# Patient Record
Sex: Female | Born: 1990 | Hispanic: Yes | Smoking: Never smoker
Health system: Southern US, Community
[De-identification: ages and names within clinical notes are randomized; demographics above are authoritative.]

## PROBLEM LIST (undated history)

## (undated) ENCOUNTER — Inpatient Hospital Stay (HOSPITAL_COMMUNITY): Payer: Self-pay

## (undated) DIAGNOSIS — G709 Myoneural disorder, unspecified: Secondary | ICD-10-CM

## (undated) DIAGNOSIS — Q899 Congenital malformation, unspecified: Secondary | ICD-10-CM

## (undated) DIAGNOSIS — G8929 Other chronic pain: Secondary | ICD-10-CM

## (undated) DIAGNOSIS — Z789 Other specified health status: Secondary | ICD-10-CM

## (undated) DIAGNOSIS — K219 Gastro-esophageal reflux disease without esophagitis: Secondary | ICD-10-CM

---

## 2011-06-24 LAB — PULMONARY FUNCTION TEST

## 2015-10-16 ENCOUNTER — Encounter: Payer: Self-pay | Admitting: Family Medicine

## 2016-03-11 ENCOUNTER — Encounter: Payer: Self-pay | Admitting: Family Medicine

## 2016-10-07 ENCOUNTER — Inpatient Hospital Stay (HOSPITAL_COMMUNITY): Admission: AD | Admit: 2016-10-07 | Payer: Self-pay | Source: Ambulatory Visit | Admitting: Obstetrics and Gynecology

## 2017-01-19 ENCOUNTER — Encounter (HOSPITAL_COMMUNITY): Payer: Self-pay

## 2017-01-19 ENCOUNTER — Inpatient Hospital Stay (HOSPITAL_COMMUNITY)
Admission: AD | Admit: 2017-01-19 | Discharge: 2017-01-20 | Disposition: A | Discharge status: Home / Self Care | Payer: Managed Care, Other (non HMO) | Source: Ambulatory Visit | Attending: Obstetrics & Gynecology | Admitting: Obstetrics & Gynecology

## 2017-01-19 DIAGNOSIS — O479 False labor, unspecified: Secondary | ICD-10-CM

## 2017-01-19 DIAGNOSIS — Z3A36 36 weeks gestation of pregnancy: Secondary | ICD-10-CM | POA: Diagnosis not present

## 2017-01-19 DIAGNOSIS — Z3483 Encounter for supervision of other normal pregnancy, third trimester: Secondary | ICD-10-CM | POA: Diagnosis present

## 2017-01-19 HISTORY — DX: Other specified health status: Z78.9

## 2017-01-19 NOTE — MAU Note (Signed)
Pt reports contractions that started this morning, but have gotten more intense tonight. Pt has not timed the contractions. Pt denies vaginal bleeding or LOF. Reports good fetal movement. Pt is scheduled for a repeat c/s on 02/10/2017.

## 2017-01-20 ENCOUNTER — Encounter (HOSPITAL_COMMUNITY): Payer: Self-pay | Admitting: *Deleted

## 2017-01-20 DIAGNOSIS — Z3483 Encounter for supervision of other normal pregnancy, third trimester: Secondary | ICD-10-CM | POA: Diagnosis not present

## 2017-01-20 LAB — URINALYSIS, ROUTINE W REFLEX MICROSCOPIC
BILIRUBIN URINE: NEGATIVE
Glucose, UA: NEGATIVE mg/dL
Hgb urine dipstick: NEGATIVE
Ketones, ur: NEGATIVE mg/dL
Nitrite: NEGATIVE
PROTEIN: 30 mg/dL — AB
Specific Gravity, Urine: 1.023 (ref 1.005–1.030)
pH: 6 (ref 5.0–8.0)

## 2017-01-20 MED ORDER — TERBUTALINE SULFATE 1 MG/ML IJ SOLN
0.2500 mg | Freq: Once | INTRAMUSCULAR | Status: AC
  Administered 2017-01-20: 0.25 mg via SUBCUTANEOUS
  Filled 2017-01-20: qty 1

## 2017-01-20 MED ORDER — CALCIUM CARBONATE ANTACID 500 MG PO CHEW
2.0000 | CHEWABLE_TABLET | Freq: Once | ORAL | Status: AC
  Administered 2017-01-20: 400 mg via ORAL
  Filled 2017-01-20: qty 2

## 2017-01-20 NOTE — MAU Note (Signed)
I have communicated with Dr. Juliene Pina and reviewed vital signs:  Vitals:   01/20/17 0000  BP: 100/62  Pulse: 87  Resp: 16  Temp: 98 F (36.7 C)    Vaginal exam:  Dilation: Closed Effacement (%): Thick Cervical Position: Middle Exam by:: B Mikal Wisman,   Also reviewed contraction pattern and that non-stress test is reactive.  It has been documented that patient is contracting rarely with.  Patient denies any other complaints.  Based on this report provider has given order for discharge.  A discharge order and diagnosis entered by a provider.   Labor discharge instructions reviewed with patient.

## 2017-01-20 NOTE — MAU Note (Signed)
Pt reports contractions and cramping that has intensified throughout the day.

## 2017-01-20 NOTE — MAU Provider Note (Signed)
Pt d/w RN. NST reactive, normal baseline 140s/ + accels/ no decels/ mod variability Toco rare after terbutaline injection.  Fup w me in office this wk  V.Juliene Pina, MD

## 2017-01-20 NOTE — Discharge Instructions (Signed)

## 2017-07-30 ENCOUNTER — Encounter (HOSPITAL_COMMUNITY): Payer: Self-pay | Admitting: *Deleted

## 2017-07-31 ENCOUNTER — Other Ambulatory Visit: Payer: Self-pay | Admitting: Obstetrics & Gynecology

## 2017-08-06 ENCOUNTER — Encounter (HOSPITAL_COMMUNITY): Payer: Self-pay | Admitting: Obstetrics & Gynecology

## 2017-08-06 NOTE — H&P (Signed)
Bethany SealsJohana Hall is an 26 y.o. female [phone]. Unplanned pregnancy. Not using OCs. Husband planning vasectomy. Last baby C/s (4th) in [place] in May'18. Pt is not breast feeding.  Married. Monogamous. Wanted sterilization but attempt failed at 3rd C/section due to adhesions and again at 4th C/section this yr. At postpartum visit she was given OCs and all options, she stopped taking OCs.  Nl Pap. No STIs.   No LMP recorded. Patient is pregnant.    Past Medical History:  Diagnosis Date  . GERD (gastroesophageal reflux disease)   . Medical history non-contributory     Past Surgical History:  Procedure Laterality Date  . CESAREAN SECTION    x4  History reviewed. No pertinent family history.  Social History:  reports that she has never smoked. She has never used smokeless tobacco. She reports that she does not drink alcohol or use drugs.  Allergies:  Allergies  Allergen Reactions  . Penicillins Rash    Has patient had a PCN reaction causing immediate rash, facial/tongue/throat swelling, SOB or lightheadedness with hypotension: Unknown Has patient had a PCN reaction causing severe rash involving mucus membranes or skin necrosis: Unknown Has patient had a PCN reaction that required hospitalization: Unknown Has patient had a PCN reaction occurring within the last 10 years: No If all of the above answers are "NO", then may proceed with Cephalosporin use. Infantile Reaction.    No prescriptions prior to admission.    ROS  Physical Exam  Ht 4\' 11"  (1.499 m)   Wt 112 lb (50.8 kg)   BMI 22.62 kg/m  BP 110/73   Pulse 89   Temp 98.1 F (36.7 C) (Oral)   Resp 16   Ht 4\' 11"  (1.499 m)   Wt 112 lb (50.8 kg)   SpO2 100%   BMI 22.62 kg/m   Physical exam:  A&O x 3, no acute distress. Pleasant HEENT neg, no thyromegaly Lungs CTA bilat CV RRR, S1S2 normal Abdo soft, non tender, non acute Extr no edema/ tenderness Pelvic 8 wk uterus. Soft. No masses  No results found for  this or any previous visit (from the past 24 hour(s)).  No results found.  Assessment/Plan: 26 yo 8wks, unplanned pregnancy, desires surgical termination with dilatation and evaculation, will use sono guidance due to C/s x4, latest being 6 mos back. . Waited over 72 hrs after she was first seen and reviewed her options and is here after she made decision for termination without any coercion.  Risks/complications of surgery reviewed incl infection, bleeding, damage to internal organs including bladder, bowels, ureters, blood vessels, other risks from anesthesia, VTE and delayed complications of any surgery, complications in future surgery reviewed.   Bethany Hall R 08/06/2017

## 2017-08-07 ENCOUNTER — Encounter (HOSPITAL_COMMUNITY)
Admission: RE | Disposition: A | Discharge status: Home / Self Care | Payer: Self-pay | Source: Ambulatory Visit | Attending: Obstetrics & Gynecology

## 2017-08-07 ENCOUNTER — Encounter (HOSPITAL_COMMUNITY): Payer: Self-pay | Admitting: *Deleted

## 2017-08-07 ENCOUNTER — Ambulatory Visit (HOSPITAL_COMMUNITY): Payer: Managed Care, Other (non HMO)

## 2017-08-07 ENCOUNTER — Ambulatory Visit (HOSPITAL_COMMUNITY): Payer: Managed Care, Other (non HMO) | Admitting: Anesthesiology

## 2017-08-07 ENCOUNTER — Ambulatory Visit (HOSPITAL_COMMUNITY)
Admission: RE | Admit: 2017-08-07 | Discharge: 2017-08-07 | Disposition: A | Discharge status: Home / Self Care | Payer: Managed Care, Other (non HMO) | Source: Ambulatory Visit | Attending: Obstetrics & Gynecology | Admitting: Obstetrics & Gynecology

## 2017-08-07 DIAGNOSIS — Z880 Allergy status to penicillin: Secondary | ICD-10-CM | POA: Diagnosis not present

## 2017-08-07 DIAGNOSIS — Z3A08 8 weeks gestation of pregnancy: Secondary | ICD-10-CM | POA: Diagnosis not present

## 2017-08-07 DIAGNOSIS — R58 Hemorrhage, not elsewhere classified: Secondary | ICD-10-CM

## 2017-08-07 DIAGNOSIS — Z332 Encounter for elective termination of pregnancy: Secondary | ICD-10-CM | POA: Diagnosis present

## 2017-08-07 DIAGNOSIS — K219 Gastro-esophageal reflux disease without esophagitis: Secondary | ICD-10-CM | POA: Insufficient documentation

## 2017-08-07 HISTORY — DX: Gastro-esophageal reflux disease without esophagitis: K21.9

## 2017-08-07 HISTORY — DX: Other chronic pain: G89.29

## 2017-08-07 HISTORY — DX: Congenital malformation, unspecified: Q89.9

## 2017-08-07 HISTORY — PX: DILATION AND EVACUATION: SHX1459

## 2017-08-07 HISTORY — DX: Myoneural disorder, unspecified: G70.9

## 2017-08-07 LAB — CBC
HEMATOCRIT: 35.9 % — AB (ref 36.0–46.0)
Hemoglobin: 11.8 g/dL — ABNORMAL LOW (ref 12.0–15.0)
MCH: 26 pg (ref 26.0–34.0)
MCHC: 32.9 g/dL (ref 30.0–36.0)
MCV: 79.2 fL (ref 78.0–100.0)
Platelets: 250 10*3/uL (ref 150–400)
RBC: 4.53 MIL/uL (ref 3.87–5.11)
RDW: 17.1 % — ABNORMAL HIGH (ref 11.5–15.5)
WBC: 12.7 10*3/uL — ABNORMAL HIGH (ref 4.0–10.5)

## 2017-08-07 SURGERY — DILATION AND EVACUATION, UTERUS
Anesthesia: Monitor Anesthesia Care | Site: Vagina

## 2017-08-07 MED ORDER — PROPOFOL 10 MG/ML IV BOLUS
INTRAVENOUS | Status: DC | PRN
  Administered 2017-08-07 (×4): 50 mg via INTRAVENOUS

## 2017-08-07 MED ORDER — ONDANSETRON HCL 4 MG/2ML IJ SOLN
INTRAMUSCULAR | Status: DC | PRN
  Administered 2017-08-07: 4 mg via INTRAVENOUS

## 2017-08-07 MED ORDER — PROPOFOL 10 MG/ML IV BOLUS
INTRAVENOUS | Status: AC
  Filled 2017-08-07: qty 20

## 2017-08-07 MED ORDER — DOXYCYCLINE HYCLATE 100 MG IV SOLR
100.0000 mg | Freq: Two times a day (BID) | INTRAVENOUS | Status: DC
  Administered 2017-08-07: 100 mg via INTRAVENOUS
  Filled 2017-08-07 (×3): qty 100

## 2017-08-07 MED ORDER — SCOPOLAMINE 1 MG/3DAYS TD PT72
1.0000 | MEDICATED_PATCH | Freq: Once | TRANSDERMAL | Status: DC
  Administered 2017-08-07: 1.5 mg via TRANSDERMAL

## 2017-08-07 MED ORDER — MIDAZOLAM HCL 2 MG/2ML IJ SOLN
INTRAMUSCULAR | Status: AC
  Filled 2017-08-07: qty 2

## 2017-08-07 MED ORDER — LIDOCAINE HCL (CARDIAC) 20 MG/ML IV SOLN
INTRAVENOUS | Status: AC
  Filled 2017-08-07: qty 5

## 2017-08-07 MED ORDER — METHYLERGONOVINE MALEATE 0.2 MG/ML IJ SOLN
INTRAMUSCULAR | Status: AC
  Filled 2017-08-07: qty 1

## 2017-08-07 MED ORDER — LACTATED RINGERS IV SOLN
INTRAVENOUS | Status: DC
  Administered 2017-08-07 (×3): via INTRAVENOUS

## 2017-08-07 MED ORDER — SCOPOLAMINE 1 MG/3DAYS TD PT72
MEDICATED_PATCH | TRANSDERMAL | Status: AC
  Filled 2017-08-07: qty 1

## 2017-08-07 MED ORDER — CHLOROPROCAINE HCL 1 % IJ SOLN
INTRAMUSCULAR | Status: AC
  Filled 2017-08-07: qty 30

## 2017-08-07 MED ORDER — MIDAZOLAM HCL 2 MG/2ML IJ SOLN
INTRAMUSCULAR | Status: DC | PRN
  Administered 2017-08-07: 2 mg via INTRAVENOUS

## 2017-08-07 MED ORDER — FENTANYL CITRATE (PF) 100 MCG/2ML IJ SOLN
INTRAMUSCULAR | Status: AC
  Administered 2017-08-07: 25 ug via INTRAVENOUS
  Filled 2017-08-07: qty 2

## 2017-08-07 MED ORDER — PROPOFOL 500 MG/50ML IV EMUL
INTRAVENOUS | Status: DC | PRN
  Administered 2017-08-07: 75 ug/kg/min via INTRAVENOUS

## 2017-08-07 MED ORDER — DIPHENHYDRAMINE HCL 50 MG/ML IJ SOLN
INTRAMUSCULAR | Status: AC
  Administered 2017-08-07: 12.5 mg via INTRAVENOUS
  Filled 2017-08-07: qty 1

## 2017-08-07 MED ORDER — FENTANYL CITRATE (PF) 100 MCG/2ML IJ SOLN
25.0000 ug | INTRAMUSCULAR | Status: DC | PRN
  Administered 2017-08-07: 25 ug via INTRAVENOUS
  Administered 2017-08-07: 50 ug via INTRAVENOUS

## 2017-08-07 MED ORDER — DIPHENHYDRAMINE HCL 50 MG/ML IJ SOLN
12.5000 mg | Freq: Once | INTRAMUSCULAR | Status: AC
  Administered 2017-08-07: 12.5 mg via INTRAVENOUS

## 2017-08-07 MED ORDER — ONDANSETRON HCL 4 MG/2ML IJ SOLN
INTRAMUSCULAR | Status: AC
  Filled 2017-08-07: qty 2

## 2017-08-07 MED ORDER — CHLOROPROCAINE HCL 1 % IJ SOLN
INTRAMUSCULAR | Status: DC | PRN
  Administered 2017-08-07: 20 mL

## 2017-08-07 MED ORDER — IBUPROFEN 200 MG PO TABS: 600.0000 mg | ORAL_TABLET | Freq: Four times a day (QID) | ORAL | 0 refills | Status: DC | PRN

## 2017-08-07 MED ORDER — METHYLERGONOVINE MALEATE 0.2 MG/ML IJ SOLN
INTRAMUSCULAR | Status: DC | PRN
Start: 2017-08-07 — End: 2017-08-07
  Administered 2017-08-07: 0.2 mg via INTRAMUSCULAR

## 2017-08-07 MED ORDER — FENTANYL CITRATE (PF) 100 MCG/2ML IJ SOLN
INTRAMUSCULAR | Status: DC | PRN
  Administered 2017-08-07 (×2): 50 ug via INTRAVENOUS

## 2017-08-07 MED ORDER — DEXAMETHASONE SODIUM PHOSPHATE 10 MG/ML IJ SOLN
INTRAMUSCULAR | Status: DC | PRN
  Administered 2017-08-07: 10 mg via INTRAVENOUS

## 2017-08-07 MED ORDER — PROMETHAZINE HCL 25 MG/ML IJ SOLN: 6.2500 mg | INTRAMUSCULAR | Status: DC | PRN

## 2017-08-07 MED ORDER — DEXAMETHASONE SODIUM PHOSPHATE 10 MG/ML IJ SOLN
INTRAMUSCULAR | Status: AC
  Filled 2017-08-07: qty 1

## 2017-08-07 MED ORDER — FENTANYL CITRATE (PF) 100 MCG/2ML IJ SOLN
INTRAMUSCULAR | Status: AC
  Filled 2017-08-07: qty 2

## 2017-08-07 MED ORDER — KETOROLAC TROMETHAMINE 30 MG/ML IJ SOLN
INTRAMUSCULAR | Status: AC
  Filled 2017-08-07: qty 1

## 2017-08-07 MED ORDER — KETOROLAC TROMETHAMINE 30 MG/ML IJ SOLN
30.0000 mg | Freq: Once | INTRAMUSCULAR | Status: AC
  Administered 2017-08-07: 30 mg via INTRAVENOUS

## 2017-08-07 SURGICAL SUPPLY — 16 items
DECANTER SPIKE VIAL GLASS SM (MISCELLANEOUS) ×3 IMPLANT
GLOVE BIO SURGEON STRL SZ7 (GLOVE) ×3 IMPLANT
GLOVE BIOGEL PI IND STRL 7.0 (GLOVE) ×2 IMPLANT
GLOVE BIOGEL PI INDICATOR 7.0 (GLOVE) ×4
GOWN STRL REUS W/TWL LRG LVL3 (GOWN DISPOSABLE) ×6 IMPLANT
KIT BERKELEY 1ST TRIMESTER 3/8 (MISCELLANEOUS) ×3 IMPLANT
NS IRRIG 1000ML POUR BTL (IV SOLUTION) ×3 IMPLANT
PACK VAGINAL MINOR WOMEN LF (CUSTOM PROCEDURE TRAY) ×3 IMPLANT
PAD OB MATERNITY 4.3X12.25 (PERSONAL CARE ITEMS) ×3 IMPLANT
PAD PREP 24X48 CUFFED NSTRL (MISCELLANEOUS) ×3 IMPLANT
SET BERKELEY SUCTION TUBING (SUCTIONS) ×3 IMPLANT
TOWEL OR 17X24 6PK STRL BLUE (TOWEL DISPOSABLE) ×6 IMPLANT
VACURETTE 10 RIGID CVD (CANNULA) IMPLANT
VACURETTE 7MM CVD STRL WRAP (CANNULA) ×3 IMPLANT
VACURETTE 8 RIGID CVD (CANNULA) ×3 IMPLANT
VACURETTE 9 RIGID CVD (CANNULA) IMPLANT

## 2017-08-07 NOTE — Anesthesia Procedure Notes (Signed)
Procedure Name: MAC Date/Time: 08/07/2017 1:10 PM Performed by: Jekhi Bolin, Sheron Nightingale Pre-anesthesia Checklist: Patient identified, Emergency Drugs available, Suction available, Patient being monitored and Timeout performed Oxygen Delivery Method: Circle system utilized Preoxygenation: Pre-oxygenation with 100% oxygen Induction Type: IV induction Ventilation: Mask ventilation without difficulty

## 2017-08-07 NOTE — Anesthesia Preprocedure Evaluation (Addendum)
Anesthesia Evaluation  Patient identified by MRN, date of birth, ID band Patient awake    Reviewed: Allergy & Precautions, NPO status , Patient's Chart, lab work & pertinent test results  Airway Mallampati: II  TM Distance: >3 FB Neck ROM: Full    Dental  (+) Teeth Intact, Dental Advisory Given   Pulmonary neg pulmonary ROS,    Pulmonary exam normal breath sounds clear to auscultation       Cardiovascular negative cardio ROS Normal cardiovascular exam Rhythm:Regular Rate:Normal     Neuro/Psych negative neurological ROS     GI/Hepatic Neg liver ROS, GERD  ,  Endo/Other  negative endocrine ROS  Renal/GU negative Renal ROS     Musculoskeletal negative musculoskeletal ROS (+)   Abdominal   Peds  Hematology negative hematology ROS (+)   Anesthesia Other Findings Day of surgery medications reviewed with the patient.  Reproductive/Obstetrics (+) Pregnancy H/o C-section x4 7-[redacted] weeks pregnant                            Anesthesia Physical Anesthesia Plan  ASA: II  Anesthesia Plan: MAC   Post-op Pain Management:    Induction: Intravenous  PONV Risk Score and Plan: 2 and Ondansetron, Propofol infusion and Midazolam  Airway Management Planned: Nasal Cannula  Additional Equipment:   Intra-op Plan:   Post-operative Plan:   Informed Consent: I have reviewed the patients History and Physical, chart, labs and discussed the procedure including the risks, benefits and alternatives for the proposed anesthesia with the patient or authorized representative who has indicated his/her understanding and acceptance.   Dental advisory given  Plan Discussed with: CRNA and Anesthesiologist  Anesthesia Plan Comments: (Discussed risks/benefits/alternatives to MAC sedation including need for ventilatory support, hypotension, need for conversion to general anesthesia.  All patient questions answered.   Patient/guardian wishes to proceed.)        Anesthesia Quick Evaluation

## 2017-08-07 NOTE — Op Note (Signed)
Preoperative diagnosis: 8 weeks unplanned, undesired pregnancy Postop diagnosis: as above.  Procedure: Suction dilatation and evacuation with sono guidance  Anesthesia General MAC and Nesacaine paracervical block . Surgeon: Azucena Fallen, MD Assistant: none  IV fluids : 600 cc LR Estimated blood loss 50 cc Urine output:  No cath Complications none Condition stable Disposition PACU  Specimen: products of conception sent to pathology.  Procedure  Indication: Decision was made to proceed with suction evacuation after options were reviewed and patient had over 72 hours wait period to have time to review her options and plan, she made her decision to terminate pregnancy at her own will. Risks/ complications of surgery including infection, bleeding, damage to internal organs including uterine perforation, Asherman syndrome were reviewed. She voiced understanding and gave informed written consent.  Patient was brought to the operating room with IV running. She received preop 100 mg Doxycyline IV.  She underwent general endotracheal anesthesia without complications. She was given dorsolithotomy position. Parts were prepped and draped in standard fashion.  Bimanual exam revealed uterus to be anteverted and 8 eek size.  Speculum was placed and cervix was grasped with single-tooth tenaculum. Cervical os was dilated to 27 [place] dilator. A # 7 suction curette followed by #8  was introduced in the uterine cavity under ultrasound guidance. Suction evacuation of products of conception was done in multiple step wise fashion. Gentle sharp curettage was performed. Suction cannula was reintroduced and cavity emptied. Instruments removed, no active bleeding noted per os or on ultrasound in the endometrial cavity.  Methergine 0.2 mg IM given prophylactically. Procedure was complete. All instruments removed. All counts are correct x2. Specimen products of conception. No complications. Patient was made supine dorsal  anesthesia and brought to the recovery room in stable condition.  Patient will be discharged home today.  follow up in 2 weeks in office. Warning signs of infection and excessive bleeding reviewed.  V.Avigdor Dollar, MD.

## 2017-08-07 NOTE — Discharge Instructions (Signed)

## 2017-08-07 NOTE — Transfer of Care (Signed)
Immediate Anesthesia Transfer of Care Note  Patient: Bethany Hall  Procedure(s) Performed: DILATATION AND EVACUATION WITH ULTRASOUND GUIDANCE  (N/A Vagina )  Patient Location: PACU  Anesthesia Type:MAC  Level of Consciousness: awake, alert  and oriented  Airway & Oxygen Therapy: Patient Spontanous Breathing and Patient connected to nasal cannula oxygen  Post-op Assessment: Report given to RN and Post -op Vital signs reviewed and stable  Post vital signs: Reviewed and stable  Last Vitals:  Vitals:   08/07/17 1238  BP: 110/73  Pulse: 89  Resp: 16  Temp: 36.7 C  SpO2: 100%    Last Pain:  Vitals:   08/07/17 1238  TempSrc: Oral  PainSc: 1       Patients Stated Pain Goal: 2 (19/75/88 3254)  Complications: No apparent anesthesia complications

## 2017-08-08 ENCOUNTER — Encounter (HOSPITAL_COMMUNITY): Payer: Self-pay | Admitting: Obstetrics & Gynecology

## 2017-08-08 NOTE — Anesthesia Postprocedure Evaluation (Signed)
Anesthesia Post Note  Patient: Bethany Hall  Procedure(s) Performed: DILATATION AND EVACUATION WITH ULTRASOUND GUIDANCE  (N/A Vagina )     Patient location during evaluation: PACU Anesthesia Type: MAC Level of consciousness: awake and alert Pain management: pain level controlled Vital Signs Assessment: post-procedure vital signs reviewed and stable Respiratory status: spontaneous breathing, nonlabored ventilation and respiratory function stable Cardiovascular status: stable and blood pressure returned to baseline Postop Assessment: no apparent nausea or vomiting Anesthetic complications: no    Last Vitals:  Vitals:   08/07/17 1514 08/07/17 1545  BP:  111/65  Pulse: 91 91  Resp: 18 20  Temp: 37 C   SpO2: 100% 100%    Last Pain:  Vitals:   08/07/17 1545  TempSrc:   PainSc: 3                  Catalina Gravel

## 2017-08-11 ENCOUNTER — Encounter (HOSPITAL_COMMUNITY): Payer: Self-pay

## 2018-10-20 ENCOUNTER — Encounter: Payer: Self-pay | Admitting: Family Medicine

## 2018-10-20 ENCOUNTER — Ambulatory Visit: Payer: BC Managed Care – PPO | Admitting: Family Medicine

## 2018-10-20 VITALS — BP 105/75 | HR 79 | Temp 98.5°F | Ht 59.0 in | Wt 131.0 lb

## 2018-10-20 DIAGNOSIS — M79601 Pain in right arm: Secondary | ICD-10-CM | POA: Diagnosis not present

## 2018-10-20 DIAGNOSIS — H04123 Dry eye syndrome of bilateral lacrimal glands: Secondary | ICD-10-CM | POA: Insufficient documentation

## 2018-10-20 DIAGNOSIS — Z7689 Persons encountering health services in other specified circumstances: Secondary | ICD-10-CM | POA: Diagnosis not present

## 2018-10-20 DIAGNOSIS — M546 Pain in thoracic spine: Secondary | ICD-10-CM

## 2018-10-20 DIAGNOSIS — R682 Dry mouth, unspecified: Secondary | ICD-10-CM | POA: Diagnosis not present

## 2018-10-20 DIAGNOSIS — M5030 Other cervical disc degeneration, unspecified cervical region: Secondary | ICD-10-CM

## 2018-10-20 DIAGNOSIS — G8929 Other chronic pain: Secondary | ICD-10-CM

## 2018-10-20 NOTE — Patient Instructions (Signed)
We are checking autoimmune labs for Sjogren's syndrome.  I recommend that you start Ocuvite, which is a vitamin for eye health.  Continue lubricating drops.  [facility] [place] [facility] Saturday hours if needed.  Schedule a complete exam with her soon.  We may need to consider something called Restasis.  I will reach out to her about this.   Sjogren Syndrome  Sjogren syndrome is a disease in which the body's disease-fighting system (immune system) attacks the glands that produce tears (lacrimal glands) and the glands that produce saliva (salivary glands). This makes the eyes and mouth very dry. Sjogren syndrome is a long-term (chronic) disorder that has no cure. In some cases, it is linked to other disorders (rheumatic disorders), such as rheumatoid arthritis and systemic lupus erythematosus (SLE). It may affect other parts of the body, such as:  Kidneys.  Blood vessels.  Joints.  Lungs.  Liver.  Pancreas.  Brain.  Nerves.  Spinal cord. What are the causes? The cause of this condition is not known. It may be passed along from parent to child (inherited), or it may be a symptom of a rheumatic disorder. What increases the risk? This condition is more likely to develop in:  Women.  People who are 4645-30 years old.  People who have recently had a viral infection or currently have a viral infection. What are the signs or symptoms? The main symptoms of this condition are:  Dry mouth. This may include: ? A chalky feeling. ? Difficulty swallowing, speaking, or tasting. ? Frequent cavities in teeth. ? Frequent mouth infections.  Dry eyes. This may include: ? Burning, redness, and itching. ? Blurry vision. ? Light sensitivity. Other symptoms may include:  Dryness of the skin and the inside of the nose.  Eyelid infections.  Vaginal dryness, if this applies.  Joint pain and stiffness.  Muscle pain and stiffness. How is this diagnosed? This condition  is diagnosed based on:  Your symptoms.  Your medical history.  A physical exam of your eyes and mouth. You may have tests, including:  Schirmer test. This tests your tear production.  An eye exam that is done with a magnifying device (slit-lamp exam).  An eye test that temporarily stains your eye with dye. This shows the extent of eye damage.  Tests to check your salivary gland function.  Biopsy. This is a removal of part of a salivary gland from inside your lower lip to be studied under a microscope.  Chest X-rays.  Blood tests.  Urine tests. How is this treated? There is no cure for this condition, but treatment can help you manage your symptoms. Treatment options may include:  Moisture replacement therapies to help relieve dryness in your skin, mouth, and eyes.  NSAIDs to help relieve pain and stiffness.  Medicines to help relieve inflammation in your body(corticosteroids). These are usually for severe cases.  Medicines to help reduce the activity of your immune system (immunosuppressants).  Surgery or insertion of plugs to close the lacrimal glands (punctal occlusion). Thishelps keep more natural tears in your eyes. Follow these instructions at home:  Take over-the-counter and prescription medicines only as told by your health care provider.  Take these actions to care for your eyes: ? Use eye drops as told by your health care provider. ? Blink at least 5-6 times a minute. ? Protect your eyes from drafts and breezes. ? Maintain properly humidified air. You may want to use a humidifier at home. ?  Avoid smoke.  Take these actions to care for your mouth: ? Brush your teeth and floss after every meal. ? Chew sugar-free gum or suck on hard candy. For some people, this can help to relieve dry mouth. ? Use antimicrobial mouthwash daily. ? Take frequent sips of water or sugar-free drinks. ? Use saliva substitutes or lip balm as told by your health care  provider.  Drink enough fluid to keep your urine clear or pale yellow.  Schedule and attend dentist visits every six months.  Keep all follow-up visits as told by your health care provider. This is important. Contact a health care provider if:  You have a fever.  You have night sweats.  You are always tired.  You have unexplained weight loss.  You develop itchy skin.  You have red patches on your skin.  You have a lump or swelling on your neck. This information is not intended to replace advice given to you by your health care provider. Make sure you discuss any questions you have with your health care provider. Document Released: 09/13/2002 Document Revised: 05/19/2016 Document Reviewed: 06/01/2015 Elsevier Interactive Patient Education  2019 [employer]

## 2018-10-20 NOTE — Progress Notes (Signed)
Subjective: JS:EGBTDVVOH care, dry eye/ chronic back and shoulder pain HPI: Bethany Hall is a 28 y.o. female presenting to clinic today for:  1. Dry eye Patient reports several year history of dry eye bilaterally.  She notes also dry mouth.  She was actually recently seen by an ophthalmologist in [place] after she cut her right eyelid and there was concern for possible ocular involvement.  She had a good eye evaluation with them recently and was told that the eye looked good.  However, she was indeed found to have dry eye and was sent home with Pataday eyedrops.  She notes that these do not seem to be helping with the dry eye at all.  She is wondering what else can be done.  Denies any known family history of autoimmune disease including Sjogren's.  She has questions though if she has an autoimmune disease like lupus and would like to be evaluated for this.  2.  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  She notes that she has had chronic stiffness and discomfort in the right upper extremity that radiates into the upper back and neck.  Additionally, she has been told that she has degenerative changes within the cervical spine.  She would like to further discuss this with the specialist and be referred to physical therapy if possible.  She does occasionally take over-the-counter pain relievers but often relies on stretching, heat and ice.    Past Medical History:  Diagnosis Date  . Chronic pain    all over body per patient - no meds  . 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 GERD (gastroesophageal reflux disease)    occas tums prm  . Medical history non-contributory   . Neuromuscular disorder ([place])    carpel tunnel bilateral   Past Surgical History:  Procedure Laterality Date  . CESAREAN SECTION  2011,2014,2016,02/2017   x 4  . DILATION AND EVACUATION N/A 08/07/2017   Procedure:  DILATATION AND EVACUATION WITH ULTRASOUND GUIDANCE ;  Surgeon: Azucena Fallen, MD;  Location: [facility];  Service: Gynecology;  Laterality: N/A;   Social History   Socioeconomic History  . Marital status: Married    Spouse name: Not on file  . Number of children: Not on file  . Years of education: Not on file  . Highest education level: Not on file  Occupational History  . Not on file  Social Needs  . Financial resource strain: Not on file  . Food insecurity:    Worry: Not on file    Inability: Not on file  . Transportation needs:    Medical: Not on file    Non-medical: Not on file  Tobacco Use  . Smoking status: Never Smoker  . Smokeless tobacco: Never Used  Substance and Sexual Activity  . Alcohol use: No    Comment: occ  . Drug use: No  . Sexual activity: Yes    Birth control/protection: Injection    Comment: approx 7-[redacted] wks gestation per patient  Lifestyle  . Physical activity:    Days per week: Not on file    Minutes per session: Not on file  . Stress: Not on file  Relationships  . Social connections:    Talks on phone: Not on file    Gets together: Not on file    Attends religious service: Not on file    Active member of  club or organization: Not on file    Attends meetings of clubs or organizations: Not on file    Relationship status: Not on file  . Intimate partner violence:    Fear of current or ex partner: Not on file    Emotionally abused: Not on file    Physically abused: Not on file    Forced sexual activity: Not on file  Other Topics Concern  . Not on file  Social History Narrative  . Not on file   Current Meds  Medication Sig  . medroxyPROGESTERone (DEPO-PROVERA) 150 MG/ML injection Inject 150 mg into the muscle every 3 (three) months.   Family History  Problem Relation Age of Onset  . Diabetes Mother   . Heart disease Father    Allergies  Allergen Reactions  . Penicillins Rash    Has patient had a PCN reaction causing immediate rash,  facial/tongue/throat swelling, SOB or lightheadedness with hypotension: Unknown Has patient had a PCN reaction causing severe rash involving mucus membranes or skin necrosis: Unknown Has patient had a PCN reaction that required hospitalization: Unknown Has patient had a PCN reaction occurring within the last 10 years: No If all of the above answers are "NO", then may proceed with Cephalosporin use. Infantile Reaction.     Health Maintenance: n/a  ROS: Per HPI  Objective: Office vital signs reviewed. BP 105/75   Pulse 79   Temp 98.5 F (36.9 C) (Oral)   Ht _0  (1.499 m)   Wt 131 lb (59.4 kg)   BMI 26.46 kg/m   Physical Examination:  General: Awake, alert, well nourished, No acute distress HEENT: Normal    Eyes: PERRLA, extraocular movement in tact, sclera white    Throat: moist mucus membranes Cardio: regular rate Pulm: no wheezing. normal work of breathing on room air Extremities: warm, well perfused, No edema, cyanosis; +2 pulses bilaterally MSK: normal gait and station  Cervical spine: She has full active range of motion all planes.  Thoracic spine: No midline tenderness to palpation.  She does have increased tonicity on the right side.  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  She does have use of the right hand but seems to be left-hand dominant.   Skin: dry; intact; no rashes or lesions Psych: Mood stable, affect appropriate, pleasant and interactive. Depression screen [facility] 2/9 10/20/2018  Decreased Interest 0  Down, Depressed, Hopeless 0  PHQ - 2 Score 0  Altered sleeping 3  Tired, decreased energy 3  Change in appetite 0  Feeling bad or failure about yourself  0  Trouble concentrating 0  Moving slowly or fidgety/restless 0  Suicidal thoughts 0  PHQ-9 Score 6  Difficult doing work/chores Not difficult at all    Assessment/ Plan: 28 y.o. female   1. Chronically dry eyes, bilateral We will  evaluate for Sjogren's.  She brings laboratory work from previous providers and she did have a negative ANA in 2011.  I see no other autoimmune work-up.  Labs as below.  We will make a plan pending this.  I have advised her to see an optometrist as well for full eye examination including vision.  May need to consider something like Restasis.  But in the interim I would like her to use over-the-counter lubricating eyedrops.  I will discuss her case further with optometry or ophthalmology. - Sedimentation Rate - CBC - Sjogren's syndrome antibods(ssa + ssb) - ANA w/Reflex if Positive -  Rheumatoid factor - CMP14+EGFR  2. Dry mouth As above - Sedimentation Rate - CBC - Sjogren's syndrome antibods(ssa + ssb) - ANA w/Reflex if Positive - Rheumatoid factor - CMP14+EGFR  3. Establishing care with new doctor, encounter for   4. Musculoskeletal pain of right upper extremity Referrals placed to both orthopedics and physical therapy.  Okay to continue current regimen for now until she can be evaluated. - Ambulatory referral to Orthopedic Surgery - Ambulatory referral to Physical Therapy  5. Chronic bilateral thoracic back pain - Ambulatory referral to Orthopedic Surgery - Ambulatory referral to Physical Therapy  6. Degenerative disc disease, cervical - Ambulatory referral to Orthopedic Surgery - Ambulatory referral to Physical Therapy   Janora Norlander, [place] [phone]

## 2018-10-21 ENCOUNTER — Telehealth: Payer: Self-pay | Admitting: Family Medicine

## 2018-10-21 LAB — CMP14+EGFR
A/G RATIO: 1.6 (ref 1.2–2.2)
ALBUMIN: 4.7 g/dL (ref 3.5–5.5)
ALK PHOS: 60 IU/L (ref 39–117)
ALT: 14 IU/L (ref 0–32)
AST: 14 IU/L (ref 0–40)
BILIRUBIN TOTAL: 0.2 mg/dL (ref 0.0–1.2)
BUN/Creatinine Ratio: 17 (ref 9–23)
BUN: 11 mg/dL (ref 6–20)
CALCIUM: 10.2 mg/dL (ref 8.7–10.2)
CHLORIDE: 108 mmol/L — AB (ref 96–106)
CO2: 18 mmol/L — ABNORMAL LOW (ref 20–29)
Creatinine, Ser: 0.63 mg/dL (ref 0.57–1.00)
GFR calc Af Amer: 139 mL/min/{1.73_m2} (ref >59)
GFR, EST NON AFRICAN AMERICAN: 121 mL/min/{1.73_m2} (ref >59)
Globulin, Total: 2.9 g/dL (ref 1.5–4.5)
Glucose: 78 mg/dL (ref 65–99)
POTASSIUM: 3.9 mmol/L (ref 3.5–5.2)
Sodium: 143 mmol/L (ref 134–144)
TOTAL PROTEIN: 7.6 g/dL (ref 6.0–8.5)

## 2018-10-21 LAB — CBC
Hematocrit: 44.9 % (ref 34.0–46.6)
Hemoglobin: 14.8 g/dL (ref 11.1–15.9)
MCH: 30.9 pg (ref 26.6–33.0)
MCHC: 33 g/dL (ref 31.5–35.7)
MCV: 94 fL (ref 79–97)
PLATELETS: 222 10*3/uL (ref 150–450)
RBC: 4.79 x10E6/uL (ref 3.77–5.28)
RDW: 13 % (ref 11.7–15.4)
WBC: 8.6 10*3/uL (ref 3.4–10.8)

## 2018-10-21 LAB — SJOGREN'S SYNDROME ANTIBODS(SSA + SSB)
ENA SSA (RO) Ab: <0.2 AI (ref 0.0–0.9)
ENA SSB (LA) Ab: <0.2 AI (ref 0.0–0.9)

## 2018-10-21 LAB — RHEUMATOID FACTOR

## 2018-10-21 LAB — SEDIMENTATION RATE: Sed Rate: 18 mm/hr (ref 0–32)

## 2018-10-21 LAB — ANA W/REFLEX IF POSITIVE: Anti Nuclear Antibody(ANA): NEGATIVE

## 2018-10-21 NOTE — Telephone Encounter (Signed)
Patient called requesting lab results, results discussed.

## 2018-10-23 ENCOUNTER — Other Ambulatory Visit: Payer: Self-pay | Admitting: Family Medicine

## 2018-10-23 MED ORDER — CLOTRIMAZOLE 1 % EX CREA: 1.0000 "application " | TOPICAL_CREAM | Freq: Two times a day (BID) | CUTANEOUS | 0 refills | Status: DC

## 2018-11-03 ENCOUNTER — Other Ambulatory Visit: Payer: Self-pay

## 2018-11-03 ENCOUNTER — Encounter: Payer: Self-pay | Admitting: Physical Therapy

## 2018-11-03 ENCOUNTER — Ambulatory Visit: Payer: BC Managed Care – PPO | Attending: Family Medicine | Admitting: Physical Therapy

## 2018-11-03 DIAGNOSIS — M546 Pain in thoracic spine: Secondary | ICD-10-CM | POA: Diagnosis present

## 2018-11-03 DIAGNOSIS — M542 Cervicalgia: Secondary | ICD-10-CM

## 2018-11-03 DIAGNOSIS — R293 Abnormal posture: Secondary | ICD-10-CM

## 2018-11-03 DIAGNOSIS — M6281 Muscle weakness (generalized): Secondary | ICD-10-CM

## 2018-11-03 NOTE — Therapy (Signed)
[facility] [address] [place], [place], [address] Phone: [phone]   Fax:  [phone]  Physical Therapy Evaluation  Patient Details  Name: Bethany SealsJohana Hall MRN: [id number] Date of Birth: 06/13/1991 Referring Provider (PT): Delynn FlavinAshly Gottschalk, DO   Encounter Date: 11/03/2018  PT End of Session - 11/03/18 2124    Visit Number  1    Number of Visits  8    Date for PT Re-Evaluation  12/29/18    PT Start Time  1519    PT Stop Time  1601    PT Time Calculation (min)  42 min    Activity Tolerance  Patient tolerated treatment well    Behavior During Therapy  [facility] for tasks assessed/performed       Past Medical History:  Diagnosis Date  . Chronic pain    all over body per patient - no meds  . 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 GERD (gastroesophageal reflux disease)    occas tums prm  . Medical history non-contributory   . Neuromuscular disorder (HCC)    carpel tunnel bilateral    Past Surgical History:  Procedure Laterality Date  . CESAREAN SECTION  2011,2014,2016,02/2017   x 4  . DILATION AND EVACUATION N/A 08/07/2017   Procedure: DILATATION AND EVACUATION WITH ULTRASOUND GUIDANCE ;  Surgeon: Shea EvansMody, Vaishali, MD;  Location: [facility];  Service: Gynecology;  Laterality: N/A;    There were no vitals filed for this visit.   Subjective Assessment - 11/03/18 2134    Subjective  Patient arrives to physical therapy with reports of ongoing cervical, upper thoracic, and right UE pain. 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 Patient reports stiffness in her cervical spine and upper thoracic spine. She frequently gets headaches due to pain and stiffness. Patient denies limitations to any of her daily activities but does report she still has pain while performing activities. Patient reports pain is constant and pain at  worst is 10/10 and can be described as sharp shooting pain. Patient reports pain at best is 5/10. Patient's goals are to decrease pain and improve ability to perform home and work activities with less pain.    Limitations  Reading    Diagnostic tests  MRI: cervical DDD per patient report    Patient Stated Goals  decrease neck tension and pain    Currently in Pain?  Yes    Pain Score  5     Pain Location  Neck    Pain Orientation  Posterior    Pain Descriptors / Indicators  Sharp;Tightness;Constant    Pain Type  Chronic pain    Pain Radiating Towards  thoracic spine bilateral UTs.    Pain Onset  More than a month ago    Pain Frequency  Constant    Aggravating Factors   movement, increased stress    Pain Relieving Factors  heating pad and ibuprofen "makes pain tolerable"    Effect of Pain on Daily Activities  pain with ADLs but does not limit ability to perform         [facility] - 11/03/18 0001      Assessment   Medical Diagnosis  Musculoskeletal pain of right upper extremity, chronic bilateral thoracic back pain, degenerative disc disease    Referring Provider (PT)  Delynn FlavinAshly Gottschalk, DO    Onset Date/Surgical Date  --  chronic, ongoing   Prior Therapy  yes, in [place]      Precautions   Precautions  None      Restrictions   Weight Bearing Restrictions  No      Balance Screen   Has the patient fallen in the past 6 months  No    Has the patient had a decrease in activity level because of a fear of falling?   No    Is the patient reluctant to leave their home because of a fear of falling?   No      Home Environment   Living Environment  Private residence      Prior Function   Level of Independence  Independent      Posture/Postural Control   Posture/Postural Control  Postural limitations    Postural Limitations  Rounded Shoulders;Forward head;Increased lumbar lordosis      ROM / Strength   AROM / PROM / Strength  AROM;Strength      AROM   Overall AROM    Deficits;Due to pain    AROM Assessment Site  Cervical    Cervical Flexion  19    Cervical Extension  40    Cervical - Right Side Bend  11    Cervical - Left Side Bend  9    Cervical - Right Rotation  36    Cervical - Left Rotation  56      Strength   Overall Strength  Due to pain    Strength Assessment Site  Shoulder    Right/Left Shoulder  Right;Left    Right Shoulder Flexion  3+/5    Right Shoulder ABduction  3+/5    Right Shoulder Internal Rotation  3/5    Right Shoulder External Rotation  3/5    Left Shoulder Flexion  3+/5    Left Shoulder ABduction  3+/5    Left Shoulder Internal Rotation  3/5    Left Shoulder External Rotation  3/5      Palpation   Palpation comment  tenderness to palapation to bilateral cervical paraspinals, bilateral UT, lateral shouders and thoracic spine.                Objective measurements completed on examination: See above findings.              PT Education - 11/03/18 2123    Education Details  chin tucks, cervical rotation stretch, scapular retractions, corner stretch    Person(s) Educated  Patient    Methods  Explanation;Demonstration;Handout    Comprehension  Verbalized understanding          PT Long Term Goals - 11/03/18 2218      PT LONG TERM GOAL #1   Title  Patient will be independent with HEP    Time  4    Period  Weeks    Status  New      PT LONG TERM GOAL #2   Title  Patient will demonstrate 60+ degrees of cervical rotation AROM to improve ability to look over shoulder while driving    Time  4    Period  Weeks    Status  New      PT LONG TERM GOAL #3   Title  Patient will demonstrate 4/5 or greater bilateral shoulder MMT in all planes to improve stability during functional tasks.    Time  4    Period  Weeks    Status  New      PT  LONG TERM GOAL #4   Title  Patient will report ability to perform all ADLs with pain less than 4/10 in cervical spine and throacic spine.     Time  4    Period   Weeks    Status  New             Plan - 11/03/18 2124    Clinical Impression Statement  Patient is a 28 year old female who presents to physical therapy with decreased cervical AROM, abnormal posture and decreased bilateral UE strength. Patient noted with tenderness to palpation to bilateral upper traps, cervical paraspinals, upper thoracic paraspinals and lateral shoulders bilaterally. Patient noted with limited supination in right UE due to congenital defect. Patient would benefit from skilled physical therapy to address deficits and address patient's goals.     Clinical Presentation  Evolving    Clinical Decision Making  Low    Rehab Potential  Good    PT Frequency  2x / week    PT Duration  4 weeks    PT Treatment/Interventions  ADLs/Self Care Home Management;Electrical Stimulation;Cryotherapy;Traction;Moist Heat;Ultrasound;Neuromuscular re-education;Manual techniques;Dry needling;Passive range of motion;Therapeutic exercise;Therapeutic activities;Patient/family education    PT Next Visit Plan  postural exercises, STW/M to cervical spine, modalities PRN for pain relief.    PT Home Exercise Plan  see patient education section.    Consulted and Agree with Plan of Care  Patient       Patient will benefit from skilled therapeutic intervention in order to improve the following deficits and impairments:  Pain, Postural dysfunction, Impaired UE functional use, Decreased strength, Decreased range of motion, Decreased activity tolerance  Visit Diagnosis: Cervicalgia  Pain in thoracic spine  Muscle weakness (generalized)  Abnormal posture     Problem List Patient Active Problem List   Diagnosis Date Noted  . Degenerative disc disease, cervical 10/20/2018  . Chronic bilateral thoracic back pain 10/20/2018  . Musculoskeletal pain of right upper extremity 10/20/2018  . Chronically dry eyes, bilateral 10/20/2018  . Dry mouth 10/20/2018    Guss BundeKrystle Malvern Kadlec, PT, DPT 11/03/2018,  10:32 PM  [facility] [address] [place], [place], [address] Phone: [phone]   Fax:  [phone]  Name: Bethany SealsJohana Hall MRN: [id number] Date of Birth: 07/30/1991

## 2018-11-17 ENCOUNTER — Ambulatory Visit: Payer: BC Managed Care – PPO | Admitting: Physical Therapy

## 2018-11-24 ENCOUNTER — Ambulatory Visit: Payer: BC Managed Care – PPO | Attending: Family Medicine | Admitting: Physical Therapy

## 2018-11-24 ENCOUNTER — Encounter: Payer: Self-pay | Admitting: Physical Therapy

## 2018-11-24 DIAGNOSIS — M542 Cervicalgia: Secondary | ICD-10-CM

## 2018-11-24 DIAGNOSIS — R293 Abnormal posture: Secondary | ICD-10-CM | POA: Diagnosis present

## 2018-11-24 DIAGNOSIS — M546 Pain in thoracic spine: Secondary | ICD-10-CM | POA: Diagnosis present

## 2018-11-24 DIAGNOSIS — M6281 Muscle weakness (generalized): Secondary | ICD-10-CM

## 2018-11-24 NOTE — Therapy (Addendum)
[facility] [place], [place], [address] Phone: [phone]   Fax:  [phone]  Physical Therapy Treatment PHYSICAL THERAPY DISCHARGE SUMMARY  Visits from Start of Care: 2  Current functional level related to goals / functional outcomes: See below   Remaining deficits: See goals   Education / Equipment: HEP Plan: Patient agrees to discharge.  Patient goals were not met. Patient is being discharged due to not returning since the last visit.  ?????    Gabriela Eves, PT, DPT 07/15/19   Patient Details  Name: Bethany Hall MRN: [id number] Date of Birth: 11-25-1990 Referring Provider (PT): Ronnie Doss, DO   Encounter Date: 11/24/2018  PT End of Session - 11/24/18 1610    Visit Number  2    Number of Visits  8    Date for PT Re-Evaluation  12/29/18    PT Start Time  4166    PT Stop Time  1610    PT Time Calculation (min)  53 min    Activity Tolerance  Patient tolerated treatment well    Behavior During Therapy  [facility] for tasks assessed/performed       Past Medical History:  Diagnosis Date  . Chronic pain    all over body per patient - no meds  . 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 GERD (gastroesophageal reflux disease)    occas tums prm  . Medical history non-contributory   . Neuromuscular disorder ([place])    carpel tunnel bilateral    Past Surgical History:  Procedure Laterality Date  . CESAREAN SECTION  2011,2014,2016,02/2017   x 4  . DILATION AND EVACUATION N/A 08/07/2017   Procedure: DILATATION AND EVACUATION WITH ULTRASOUND GUIDANCE ;  Surgeon: Azucena Fallen, MD;  Location: [facility];  Service: Gynecology;  Laterality: N/A;    There were no vitals filed for this visit.      [facility] PT Assessment - 11/24/18 0001      Assessment   Medical Diagnosis  Musculoskeletal pain of right upper extremity, chronic bilateral thoracic back pain, degenerative disc disease     Referring Provider (PT)  Ronnie Doss, DO    Prior Therapy  yes, in [place]      Precautions   Precautions  None                   [facility] Adult PT Treatment/Exercise - 11/24/18 0001      Exercises   Exercises  Neck      Neck Exercises: Machines for Strengthening   UBE (Upper Arm Bike)  120 RPM x6 mins (3 fwd, 3 bwd)      Neck Exercises: Seated   Other Seated Exercise  Pulleys x3 minutes      Modalities   Modalities  Electrical Stimulation;Moist Heat      Moist Heat Therapy   Number Minutes Moist Heat  15 Minutes    Moist Heat Location  Cervical      Electrical Stimulation   Electrical Stimulation Location  cervical and bilateral UTs    Electrical Stimulation Action  IFC    Electrical Stimulation Parameters  80-150 hz x15 mins    Electrical Stimulation Goals  Pain      Manual Therapy   Manual Therapy  Soft tissue mobilization;Manual Traction    Soft tissue mobilization  STW/M to bilateral UT cervical paraspinals     Manual Traction  gentle manual traction and suboccipital release  to decrease tension                  PT Long Term Goals - 11/03/18 2218      PT LONG TERM GOAL #1   Title  Patient will be independent with HEP    Time  4    Period  Weeks    Status  New      PT LONG TERM GOAL #2   Title  Patient will demonstrate 60+ degrees of cervical rotation AROM to improve ability to look over shoulder while driving    Time  4    Period  Weeks    Status  New      PT LONG TERM GOAL #3   Title  Patient will demonstrate 4/5 or greater bilateral shoulder MMT in all planes to improve stability during functional tasks.    Time  4    Period  Weeks    Status  New      PT LONG TERM GOAL #4   Title  Patient will report ability to perform all ADLs with pain less than 4/10 in cervical spine and throacic spine.     Time  4    Period  Weeks    Status  New            Plan - 11/24/18 1611    Clinical Impression Statement  Patient was  able to tolerate treatment well  despite reports of pain at start of session. Patient was able to perform all exercises with good for and within availiable ROM. Patient noted with increased tone to bilateral UTs which decreased slightly after manual therapy. Normal response to modalities upon removal. Patient reported head and neck feeling lighter but still sore.     Clinical Presentation  Evolving    Clinical Decision Making  Low    Rehab Potential  Good    PT Frequency  2x / week    PT Duration  4 weeks    PT Treatment/Interventions  ADLs/Self Care Home Management;Electrical Stimulation;Cryotherapy;Traction;Moist Heat;Ultrasound;Neuromuscular re-education;Manual techniques;Dry needling;Passive range of motion;Therapeutic exercise;Therapeutic activities;Patient/family education    PT Next Visit Plan  postural exercises, STW/M to cervical spine, modalities PRN for pain relief.    PT Home Exercise Plan  see patient education section.    Consulted and Agree with Plan of Care  Patient       Patient will benefit from skilled therapeutic intervention in order to improve the following deficits and impairments:  Pain, Postural dysfunction, Impaired UE functional use, Decreased strength, Decreased range of motion, Decreased activity tolerance  Visit Diagnosis: Pain in thoracic spine  Muscle weakness (generalized)  Cervicalgia  Abnormal posture     Problem List Patient Active Problem List   Diagnosis Date Noted  . Degenerative disc disease, cervical 10/20/2018  . Chronic bilateral thoracic back pain 10/20/2018  . Musculoskeletal pain of right upper extremity 10/20/2018  . Chronically dry eyes, bilateral 10/20/2018  . Dry mouth 10/20/2018    Gabriela Eves 11/24/2018, 4:23 PM  [facility] [place], [place], [address] Phone: [phone]   Fax:  [phone]  Name: Bethany Hall MRN: [id number] Date of Birth: 1990-12-04

## 2019-01-29 ENCOUNTER — Ambulatory Visit (INDEPENDENT_AMBULATORY_CARE_PROVIDER_SITE_OTHER): Payer: BC Managed Care – PPO | Admitting: Family Medicine

## 2019-01-29 ENCOUNTER — Encounter: Payer: Self-pay | Admitting: Family Medicine

## 2019-01-29 ENCOUNTER — Other Ambulatory Visit: Payer: Self-pay

## 2019-01-29 DIAGNOSIS — B373 Candidiasis of vulva and vagina: Secondary | ICD-10-CM | POA: Diagnosis not present

## 2019-01-29 DIAGNOSIS — B3731 Acute candidiasis of vulva and vagina: Secondary | ICD-10-CM

## 2019-01-29 DIAGNOSIS — N3000 Acute cystitis without hematuria: Secondary | ICD-10-CM

## 2019-01-29 MED ORDER — FLUCONAZOLE 150 MG PO TABS: 150.0000 mg | ORAL_TABLET | Freq: Once | ORAL | 0 refills | Status: AC

## 2019-01-29 MED ORDER — NITROFURANTOIN MONOHYD MACRO 100 MG PO CAPS: 100.0000 mg | ORAL_CAPSULE | Freq: Two times a day (BID) | ORAL | 0 refills | Status: AC

## 2019-01-29 NOTE — Progress Notes (Signed)
Telephone visit  Subjective: CC: ?UTI PCP: Raliegh Ip, DO TDS:KAJGOT Bethany Hall is a 28 y.o. female calls for telephone consult today. Patient provides verbal consent for consult held via phone.  Location of patient: home Location of provider: [facility] Others present for call: none  1. Urinary symptoms Patient reports a 2 day h/o urinary frequency, urgency. She reports vaginal itching and lower abdominal pain.  Denies hematuria, fevers, chills, nausea, vomiting, back pain, vaginal discharge.  Patient has used water and cranberry juice for symptoms.  Patient denies a h/o frequent or recurrent UTIs.   She is treated with Depo-Provera every 3 months for contraception.  She is currently having some spotting vaginally but this is typical for her.  She had her last Depo shot a few weeks ago.  No lapse.   ROS: Per HPI  Allergies  Allergen Reactions  . Penicillins Rash    Has patient had a PCN reaction causing immediate rash, facial/tongue/throat swelling, SOB or lightheadedness with hypotension: Unknown Has patient had a PCN reaction causing severe rash involving mucus membranes or skin necrosis: Unknown Has patient had a PCN reaction that required hospitalization: Unknown Has patient had a PCN reaction occurring within the last 10 years: No If all of the above answers are "NO", then may proceed with Cephalosporin use. Infantile Reaction.   Past Medical History:  Diagnosis Date  . Chronic pain    all over body per patient - no meds  . 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 GERD (gastroesophageal reflux disease)    occas tums prm  . Medical history non-contributory   . Neuromuscular disorder (HCC)    carpel tunnel bilateral    Current Outpatient Medications:  .  clotrimazole (CLOTRIMAZOLE AF) 1 % cream, Apply 1 application topically 2 (two) times daily. x7 days., Disp: 30 g, Rfl: 0 .  ibuprofen (MOTRIN IB) 200 MG tablet, Take 3 tablets (600 mg total)  by mouth every 6 (six) hours as needed., Disp: 30 tablet, Rfl: 0 .  medroxyPROGESTERone (DEPO-PROVERA) 150 MG/ML injection, Inject 150 mg into the muscle every 3 (three) months., Disp: , Rfl:   Assessment/ Plan: 28 y.o. female   1. Acute cystitis without hematuria Sounds consistent with an acute cystitis.  I have prescribed her Macrobid.  Instructions for use discussed with the patient.  Okay to continue water and cranberry juice that she finds is helpful.  Have also prescribed her Diflucan for presumed yeast vaginitis.  We discussed reasons for return and emergent evaluation.  She will follow-up PRN - nitrofurantoin, macrocrystal-monohydrate, (MACROBID) 100 MG capsule; Take 1 capsule (100 mg total) by mouth 2 (two) times daily for 5 days.  Dispense: 10 capsule; Refill: 0  2. Yeast vaginitis - fluconazole (DIFLUCAN) 150 MG tablet; Take 1 tablet (150 mg total) by mouth once for 1 dose.  Dispense: 1 tablet; Refill: 0   Start time: 12:38pm End time: 12:44pm  Total time spent on patient care (including telephone call/ virtual visit): 15 minutes  Macalister Arnaud Hulen Skains, DO [facility] [phone]

## 2019-01-29 NOTE — Patient Instructions (Signed)
Urinary Tract Infection, Adult A urinary tract infection (UTI) is an infection of any part of the urinary tract. The urinary tract includes:  The kidneys.  The ureters.  The bladder.  The urethra. These organs make, store, and get rid of pee (urine) in the body. What are the causes? This is caused by germs (bacteria) in your genital area. These germs grow and cause swelling (inflammation) of your urinary tract. What increases the risk? You are more likely to develop this condition if:  You have a small, thin tube (catheter) to drain pee.  You cannot control when you pee or poop (incontinence).  You are female, and: ? You use these methods to prevent pregnancy: ? A medicine that kills sperm (spermicide). ? A device that blocks sperm (diaphragm). ? You have low levels of a female hormone (estrogen). ? You are pregnant.  You have genes that add to your risk.  You are sexually active.  You take antibiotic medicines.  You have trouble peeing because of: ? A prostate that is bigger than normal, if you are female. ? A blockage in the part of your body that drains pee from the bladder (urethra). ? A kidney stone. ? A nerve condition that affects your bladder (neurogenic bladder). ? Not getting enough to drink. ? Not peeing often enough.  You have other conditions, such as: ? Diabetes. ? A weak disease-fighting system (immune system). ? Sickle cell disease. ? Gout. ? Injury of the spine. What are the signs or symptoms? Symptoms of this condition include:  Needing to pee right away (urgently).  Peeing often.  Peeing small amounts often.  Pain or burning when peeing.  Blood in the pee.  Pee that smells bad or not like normal.  Trouble peeing.  Pee that is cloudy.  Fluid coming from the vagina, if you are female.  Pain in the belly or lower back. Other symptoms include:  Throwing up (vomiting).  No urge to eat.  Feeling mixed up (confused).  Being tired  and grouchy (irritable).  A fever.  Watery poop (diarrhea). How is this treated? This condition may be treated with:  Antibiotic medicine.  Other medicines.  Drinking enough water. Follow these instructions at home:  Medicines  Take over-the-counter and prescription medicines only as told by your doctor.  If you were prescribed an antibiotic medicine, take it as told by your doctor. Do not stop taking it even if you start to feel better. General instructions  Make sure you: ? Pee until your bladder is empty. ? Do not hold pee for a long time. ? Empty your bladder after sex. ? Wipe from front to back after pooping if you are a female. Use each tissue one time when you wipe.  Drink enough fluid to keep your pee pale yellow.  Keep all follow-up visits as told by your doctor. This is important. Contact a doctor if:  You do not get better after 1-2 days.  Your symptoms go away and then come back. Get help right away if:  You have very bad back pain.  You have very bad pain in your lower belly.  You have a fever.  You are sick to your stomach (nauseous).  You are throwing up. Summary  A urinary tract infection (UTI) is an infection of any part of the urinary tract.  This condition is caused by germs in your genital area.  There are many risk factors for a UTI. These include having a small, thin   tube to drain pee and not being able to control when you pee or poop.  Treatment includes antibiotic medicines for germs.  Drink enough fluid to keep your pee pale yellow. This information is not intended to replace advice given to you by your health care provider. Make sure you discuss any questions you have with your health care provider. Document Released: 03/11/2008 Document Revised: 04/02/2018 Document Reviewed: 04/02/2018 Elsevier Interactive Patient Education  2019 Elsevier Inc.  

## 2019-02-11 ENCOUNTER — Ambulatory Visit (INDEPENDENT_AMBULATORY_CARE_PROVIDER_SITE_OTHER): Payer: BC Managed Care – PPO | Admitting: Nurse Practitioner

## 2019-02-11 ENCOUNTER — Encounter: Payer: Self-pay | Admitting: Nurse Practitioner

## 2019-02-11 ENCOUNTER — Other Ambulatory Visit: Payer: Self-pay

## 2019-02-11 DIAGNOSIS — S025XXA Fracture of tooth (traumatic), initial encounter for closed fracture: Secondary | ICD-10-CM | POA: Diagnosis not present

## 2019-02-11 MED ORDER — CLINDAMYCIN HCL 300 MG PO CAPS: 300.0000 mg | ORAL_CAPSULE | Freq: Three times a day (TID) | ORAL | 0 refills | Status: DC

## 2019-02-11 NOTE — Progress Notes (Signed)
   Virtual Visit via telephone Note  I connected with Bethany Hall on 02/11/19 at 2:50 PM by telephone and verified that I am speaking with the correct person using two identifiers. Bethany Hall is currently located at home and no one is currently with her during visit. The provider, Mary-Margaret Daphine Deutscher, FNP is located in their office at time of visit.  I discussed the limitations, risks, security and privacy concerns of performing an evaluation and management service by telephone and the availability of in person appointments. I also discussed with the patient that there may be a patient responsible charge related to this service. The patient expressed understanding and agreed to proceed.   History and Present Illness:   Chief Complaint: tooth ache  HPI Patient calls in today c/o of a toothache that started last night after breaking a tooth. She does not have a dentist the pain is rated 8/10. Has not been able to get an appointment with dnetist.  Review of Systems  Constitutional: Negative.   HENT:       Right upper molar pain and cheek pain  Respiratory: Negative.   Cardiovascular: Negative.   Neurological: Negative.   Psychiatric/Behavioral: Negative.   All other systems reviewed and are negative.    Observations/Objective: Alert and oriented Mild distress Right cheek swelling  Assessment and Plan: [name] in today with chief complaint of Dental Pain   1. Closed fracture of tooth, initial encounter  Follow Up Instructions: Meds ordered this encounter  Medications  . clindamycin (CLEOCIN) 300 MG capsule    Sig: Take 1 capsule (300 mg total) by mouth 3 (three) times daily.    Dispense:  30 capsule    Refill:  0    Order Specific Question:   Supervising Provider    Answer:   Arville Care A [[id number]]   Gargle with salt water Need to see dentist    I discussed the assessment and treatment plan with the patient. The patient was provided an  opportunity to ask questions and all were answered. The patient agreed with the plan and demonstrated an understanding of the instructions.   The patient was advised to call back or seek an in-person evaluation if the symptoms worsen or if the condition fails to improve as anticipated.  The above assessment and management plan was discussed with the patient. The patient verbalized understanding of and has agreed to the management plan. Patient is aware to call the clinic if symptoms persist or worsen. Patient is aware when to return to the clinic for a follow-up visit. Patient educated on when it is appropriate to go to the emergency department.   Time call ended:  3:00 PM  I provided 15  minutes of non-face-to-face time during this encounter.    Mary-Margaret Daphine Deutscher, FNP

## 2019-04-28 ENCOUNTER — Ambulatory Visit (INDEPENDENT_AMBULATORY_CARE_PROVIDER_SITE_OTHER): Payer: BC Managed Care – PPO | Admitting: Family Medicine

## 2019-04-28 DIAGNOSIS — N3000 Acute cystitis without hematuria: Secondary | ICD-10-CM | POA: Diagnosis not present

## 2019-04-28 DIAGNOSIS — N898 Other specified noninflammatory disorders of vagina: Secondary | ICD-10-CM

## 2019-04-28 MED ORDER — FLUCONAZOLE 150 MG PO TABS: 150.0000 mg | ORAL_TABLET | Freq: Once | ORAL | 0 refills | Status: AC

## 2019-04-28 MED ORDER — NITROFURANTOIN MONOHYD MACRO 100 MG PO CAPS: 100.0000 mg | ORAL_CAPSULE | Freq: Two times a day (BID) | ORAL | 0 refills | Status: AC

## 2019-04-28 NOTE — Progress Notes (Signed)
Telephone visit  Subjective: CC:?UTI PCP: Janora Norlander, DO UMP:NTIRWE Bethany Hall is a 28 y.o. female calls for telephone consult today. Patient provides verbal consent for consult held via phone.  Location of patient: home Location of provider: [facility] Others present for call: none  1. UTI Patient reports about 1 week history of pelvic pain, low back pain, increased urinary frequency and urgency.  No hematuria, fevers, nausea or vomiting.  She notes that she used an Azo test at home and she was positive for UTI.  When she went and sought care at her GYN her urine was rechecked and this was negative for UTI.  She was sent home with Diflucan for yeast infection but no antibiotics.  She goes on to state that she repeated the urine test at home and again it was positive.  She is calling for advice.  Additionally, she reports vaginal dryness that is been ongoing.  She wonders if this is related to her Depakote shot.   ROS: Per HPI  Allergies  Allergen Reactions  . Penicillins Rash    Has patient had a PCN reaction causing immediate rash, facial/tongue/throat swelling, SOB or lightheadedness with hypotension: Unknown Has patient had a PCN reaction causing severe rash involving mucus membranes or skin necrosis: Unknown Has patient had a PCN reaction that required hospitalization: Unknown Has patient had a PCN reaction occurring within the last 10 years: No If all of the above answers are "NO", then may proceed with Cephalosporin use. Infantile Reaction.   Past Medical History:  Diagnosis Date  . Chronic pain    all over body per patient - no meds  . 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 GERD (gastroesophageal reflux disease)    occas tums prm  . Medical history non-contributory   . Neuromuscular disorder ([place])    carpel tunnel bilateral    Current Outpatient Medications:  .  clindamycin (CLEOCIN) 300 MG capsule, Take 1 capsule (300 mg total) by mouth 3  (three) times daily., Disp: 30 capsule, Rfl: 0 .  ibuprofen (MOTRIN IB) 200 MG tablet, Take 3 tablets (600 mg total) by mouth every 6 (six) hours as needed., Disp: 30 tablet, Rfl: 0 .  medroxyPROGESTERone (DEPO-PROVERA) 150 MG/ML injection, Inject 150 mg into the muscle every 3 (three) months., Disp: , Rfl:   Assessment/ Plan: 28 y.o. female   1. Acute cystitis without hematuria Clinically consistent with a UTI.  Sent Macrobid twice daily for 5 days.  Diflucan sent empirically as well.  Instructions reviewed and reasons for return discussed.  We discussed that if symptoms are not improving on prescribed medication I would like her to come in for an early morning appointment to collect a urine. - nitrofurantoin, macrocrystal-monohydrate, (MACROBID) 100 MG capsule; Take 1 capsule (100 mg total) by mouth 2 (two) times daily for 5 days.  Dispense: 10 capsule; Refill: 0 - fluconazole (DIFLUCAN) 150 MG tablet; Take 1 tablet (150 mg total) by mouth once for 1 dose.  Dispense: 1 tablet; Refill: 0  2. Vaginal dryness Possibly related to Depo-Provera.  Encouraged personal lubricant.  May need to consider alternative form of birth control.  To be discussed with OB/GYN.   Start time: 2:31pm End time: 2:34pm  Total time spent on patient care (including telephone call/ virtual visit): 10 minutes  [place], [place] [phone]

## 2019-04-28 NOTE — Patient Instructions (Signed)
Urinary Tract Infection, Adult A urinary tract infection (UTI) is an infection of any part of the urinary tract. The urinary tract includes:  The kidneys.  The ureters.  The bladder.  The urethra. These organs make, store, and get rid of pee (urine) in the body. What are the causes? This is caused by germs (bacteria) in your genital area. These germs grow and cause swelling (inflammation) of your urinary tract. What increases the risk? You are more likely to develop this condition if:  You have a small, thin tube (catheter) to drain pee.  You cannot control when you pee or poop (incontinence).  You are female, and: ? You use these methods to prevent pregnancy: ? A medicine that kills sperm (spermicide). ? A device that blocks sperm (diaphragm). ? You have low levels of a female hormone (estrogen). ? You are pregnant.  You have genes that add to your risk.  You are sexually active.  You take antibiotic medicines.  You have trouble peeing because of: ? A prostate that is bigger than normal, if you are female. ? A blockage in the part of your body that drains pee from the bladder (urethra). ? A kidney stone. ? A nerve condition that affects your bladder (neurogenic bladder). ? Not getting enough to drink. ? Not peeing often enough.  You have other conditions, such as: ? Diabetes. ? A weak disease-fighting system (immune system). ? Sickle cell disease. ? Gout. ? Injury of the spine. What are the signs or symptoms? Symptoms of this condition include:  Needing to pee right away (urgently).  Peeing often.  Peeing small amounts often.  Pain or burning when peeing.  Blood in the pee.  Pee that smells bad or not like normal.  Trouble peeing.  Pee that is cloudy.  Fluid coming from the vagina, if you are female.  Pain in the belly or lower back. Other symptoms include:  Throwing up (vomiting).  No urge to eat.  Feeling mixed up (confused).  Being tired  and grouchy (irritable).  A fever.  Watery poop (diarrhea). How is this treated? This condition may be treated with:  Antibiotic medicine.  Other medicines.  Drinking enough water. Follow these instructions at home:  Medicines  Take over-the-counter and prescription medicines only as told by your doctor.  If you were prescribed an antibiotic medicine, take it as told by your doctor. Do not stop taking it even if you start to feel better. General instructions  Make sure you: ? Pee until your bladder is empty. ? Do not hold pee for a long time. ? Empty your bladder after sex. ? Wipe from front to back after pooping if you are a female. Use each tissue one time when you wipe.  Drink enough fluid to keep your pee pale yellow.  Keep all follow-up visits as told by your doctor. This is important. Contact a doctor if:  You do not get better after 1-2 days.  Your symptoms go away and then come back. Get help right away if:  You have very bad back pain.  You have very bad pain in your lower belly.  You have a fever.  You are sick to your stomach (nauseous).  You are throwing up. Summary  A urinary tract infection (UTI) is an infection of any part of the urinary tract.  This condition is caused by germs in your genital area.  There are many risk factors for a UTI. These include having a small, thin   tube to drain pee and not being able to control when you pee or poop.  Treatment includes antibiotic medicines for germs.  Drink enough fluid to keep your pee pale yellow. This information is not intended to replace advice given to you by your health care provider. Make sure you discuss any questions you have with your health care provider. Document Released: 03/11/2008 Document Revised: 09/10/2018 Document Reviewed: 04/02/2018 Elsevier Patient Education  2020 Elsevier Inc.  

## 2019-06-24 ENCOUNTER — Ambulatory Visit (INDEPENDENT_AMBULATORY_CARE_PROVIDER_SITE_OTHER): Payer: BC Managed Care – PPO | Admitting: Family Medicine

## 2019-06-24 DIAGNOSIS — M2550 Pain in unspecified joint: Secondary | ICD-10-CM | POA: Diagnosis not present

## 2019-06-24 DIAGNOSIS — H04123 Dry eye syndrome of bilateral lacrimal glands: Secondary | ICD-10-CM

## 2019-06-24 NOTE — Progress Notes (Signed)
Telephone visit  Subjective: CC: eye pain PCP: Raliegh IpGottschalk, Delphia Kaylor M, DO WUJ:WJXBJYHPI:Bethany Hall is a 28 y.o. female calls for telephone consult today. Patient provides verbal consent for consult held via phone.  Location of patient: work Location of provider: Working remotely from home Others present for call: none  1.  Dry eyes/polyarthralgia Patient reports ongoing dry eyes since our initial evaluation earlier this year.  She has had some intermittent sharp pains in the right eye, which is where she had an injury previously.  She notes that her ophthalmologist determined that she was healing appropriately and did not recommend further work-up.  She does note that she was also prescribed an eyedrop to help with the dry eyes but this has not been helpful at all.  She is used various over-the-counter lubricating drops with little improvement.  She has not sought reevaluation with ophthalmology.  With regards to her polyarthralgia, she has been evaluated by both orthopedics and rheumatology.  Her most recent rheumatology visit determined no autoimmune component evident as causation of her symptoms.  There was concern for possible fibromyalgia and they recommended consideration for Lyrica.  Patient does not want to go on that medication for fear of side effects.  She states that she is in quite a bit of pain all the time.  She would like a second opinion  ROS: Per HPI  Allergies  Allergen Reactions  . Penicillins Rash    Has patient had a PCN reaction causing immediate rash, facial/tongue/throat swelling, SOB or lightheadedness with hypotension: Unknown Has patient had a PCN reaction causing severe rash involving mucus membranes or skin necrosis: Unknown Has patient had a PCN reaction that required hospitalization: Unknown Has patient had a PCN reaction occurring within the last 10 years: No If all of the above answers are "NO", then may proceed with Cephalosporin use. Infantile Reaction.   Past  Medical History:  Diagnosis Date  . Chronic pain    all over body per patient - no meds  . 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 GERD (gastroesophageal reflux disease)    occas tums prm  . Medical history non-contributory   . Neuromuscular disorder ([facility])    carpel tunnel bilateral    Current Outpatient Medications:  .  clindamycin (CLEOCIN) 300 MG capsule, Take 1 capsule (300 mg total) by mouth 3 (three) times daily., Disp: 30 capsule, Rfl: 0 .  ibuprofen (MOTRIN IB) 200 MG tablet, Take 3 tablets (600 mg total) by mouth every 6 (six) hours as needed., Disp: 30 tablet, Rfl: 0 .  medroxyPROGESTERone (DEPO-PROVERA) 150 MG/ML injection, Inject 150 mg into the muscle every 3 (three) months., Disp: , Rfl:   Assessment/ Plan: 28 y.o. female   1. Chronically dry eyes, bilateral We discussed that if symptoms are refractory to lubricants and the prescription eyedrops prescribed by ophthalmology I would certainly recommend that she seek reevaluation by ophthalmology.  I wonder if she would be a good candidate for Restasis.  I discussed with her that failure of the therapy prescribed by ophthalmology warrants reevaluation.  She has been worked up for autoimmune disorders including Sjogren's which has been negative to date.  She was also seen by rheumatology who were unable to find an autoimmune etiology of her symptoms including polyarthralgia.  2. Polyarthralgia She is had evaluation by orthopedics who ultimately referred her to rheumatology.  Her work-up has been negative to date.  There was concern for possible fibromyalgia.  We discussed trial of Cymbalta plus or minus gabapentin/Lyrica but patient was not amenable to medications for fear of side effects.  We discussed staying physically active, adequate hydration and rest as nonpharmacologic treatments of fibro.  I do think that some of the symptoms she is describing in her wrist are reflective of a carpal tunnel  syndrome and I have recommended bracing at nighttime with activity modification.  We discussed that she could consider reevaluation with Ortho for second opinion.  I have recommended that she consider seeking evaluation at an academic center and she wishes to proceed with the second opinion at [place].  Of note, she is going to work on getting the records sent from her previous specialist in [place]. - Ambulatory referral to Orthopedic Surgery   Start time: 1:16pm End time: 1:32pm  Total time spent on patient care (including telephone call/ virtual visit): 22 minutes  [place], [place] [phone]

## 2019-06-28 ENCOUNTER — Other Ambulatory Visit: Payer: Self-pay | Admitting: Family Medicine

## 2019-06-28 DIAGNOSIS — M2550 Pain in unspecified joint: Secondary | ICD-10-CM

## 2019-06-28 NOTE — Progress Notes (Signed)
oly

## 2019-07-01 ENCOUNTER — Other Ambulatory Visit: Payer: Self-pay

## 2019-07-01 DIAGNOSIS — Z20822 Contact with and (suspected) exposure to covid-19: Secondary | ICD-10-CM

## 2019-07-02 ENCOUNTER — Telehealth: Payer: Self-pay | Admitting: General Practice

## 2019-07-02 LAB — NOVEL CORONAVIRUS, NAA: SARS-CoV-2, NAA: NOT DETECTED

## 2019-07-02 NOTE — Telephone Encounter (Signed)
Patient informed of negative covid-19 result. Patient verbalized understanding.  °

## 2019-07-07 ENCOUNTER — Other Ambulatory Visit: Payer: Self-pay | Admitting: *Deleted

## 2019-07-07 DIAGNOSIS — Z20822 Contact with and (suspected) exposure to covid-19: Secondary | ICD-10-CM

## 2019-07-09 ENCOUNTER — Telehealth: Payer: Self-pay | Admitting: Family Medicine

## 2019-07-09 LAB — NOVEL CORONAVIRUS, NAA: SARS-CoV-2, NAA: NOT DETECTED

## 2019-07-09 NOTE — Telephone Encounter (Signed)
It appears that she has to see Rheumatology before ortho will see her.  The chart says she has rheum appt scheduled.

## 2019-07-12 ENCOUNTER — Telehealth: Payer: Self-pay | Admitting: Family Medicine

## 2019-07-12 ENCOUNTER — Other Ambulatory Visit: Payer: Self-pay | Admitting: Family Medicine

## 2019-07-12 DIAGNOSIS — M79601 Pain in right arm: Secondary | ICD-10-CM

## 2019-07-12 DIAGNOSIS — G8929 Other chronic pain: Secondary | ICD-10-CM

## 2019-07-12 DIAGNOSIS — M5030 Other cervical disc degeneration, unspecified cervical region: Secondary | ICD-10-CM

## 2019-07-12 NOTE — Telephone Encounter (Signed)
Pt aware.

## 2019-07-12 NOTE — Telephone Encounter (Signed)
done

## 2019-07-12 NOTE — Telephone Encounter (Signed)
Patient is aware she needs to rheum

## 2019-07-15 ENCOUNTER — Ambulatory Visit: Payer: BC Managed Care – PPO | Attending: Family Medicine | Admitting: Physical Therapy

## 2019-07-15 ENCOUNTER — Encounter: Payer: Self-pay | Admitting: Physical Therapy

## 2019-07-15 ENCOUNTER — Other Ambulatory Visit: Payer: Self-pay

## 2019-07-15 DIAGNOSIS — M542 Cervicalgia: Secondary | ICD-10-CM

## 2019-07-15 DIAGNOSIS — M6281 Muscle weakness (generalized): Secondary | ICD-10-CM | POA: Diagnosis present

## 2019-07-15 DIAGNOSIS — M546 Pain in thoracic spine: Secondary | ICD-10-CM | POA: Insufficient documentation

## 2019-07-15 DIAGNOSIS — M79601 Pain in right arm: Secondary | ICD-10-CM | POA: Insufficient documentation

## 2019-07-15 NOTE — Therapy (Signed)
[facility] [address] [place], [place], [address] Phone: [phone]   Fax:  [phone]  Physical Therapy Evaluation  Patient Details  Name: Bethany Hall MRN: [id number] Date of Birth: 1991-01-05 Referring Provider (PT): Delynn Flavin, DO   Encounter Date: 07/15/2019  PT End of Session - 07/15/19 2051    Visit Number  1    Number of Visits  12    Date for PT Re-Evaluation  09/02/19    PT Start Time  1347    PT Stop Time  1430    PT Time Calculation (min)  43 min    Activity Tolerance  Patient tolerated treatment well    Behavior During Therapy  [facility] for tasks assessed/performed       Past Medical History:  Diagnosis Date  . Chronic pain    all over body per patient - no meds  . 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 GERD (gastroesophageal reflux disease)    occas tums prm  . Medical history non-contributory   . Neuromuscular disorder (H[facility])    carpel tunnel bilateral    Past Surgical History:  Procedure Laterality Date  . CESAREAN SECTION  2011,2014,2016,02/2017   x 4  . DILATION AND EVACUATION N/A 08/07/2017   Procedure: DILATATION AND EVACUATION WITH ULTRASOUND GUIDANCE ;  Surgeon: Shea Evans, MD;  Location: [facility];  Service: Gynecology;  Laterality: N/A;    There were no vitals filed for this visit.   Subjective Assessment - 07/15/19 2043    Subjective  COVID-19 screening performed upon arrival.Patient arrives to physical therapy with reports of chronic cervical, thoracic and right arm pain. 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 Patient reports she is able to perform work, home activities, and exercise but with pain. Patient reports most pain with sitting and sleeping and feels stiffer once getting up. Patient's pain at worst is 10/10 and pain at best is 5/10. Patient's goals are to  decrease pain, improve ROM, and improve strength.    Pertinent History  amyoplasia congenita (congenital disorder), fibromyalgia (getting a second opinion)    Limitations  Reading;House hold activities    Diagnostic tests  MRI: cervical DDD per patient report    Patient Stated Goals  decrease neck stiffness and pain, improve ROM of right UE    Currently in Pain?  Yes    Pain Score  7     Pain Location  Neck    Pain Orientation  Mid;Lower;Posterior    Pain Descriptors / Indicators  Tightness;Constant    Pain Type  Chronic pain    Pain Radiating Towards  bilateral UTs and rhomboid region    Pain Onset  More than a month ago    Pain Frequency  Constant    Aggravating Factors   sleeping or sitting    Pain Relieving Factors  heating pad, ibuprofen.    Effect of Pain on Daily Activities  pain with ADLs, sleeping, and work activities.         [facility] PT Assessment - 07/15/19 0001      Assessment   Medical Diagnosis  Degenerative disc disorder, cervical; Chronic bilateral thoracic pain; Musculoskeletal pain of right UT    Referring Provider (PT)  Delynn Flavin, DO    Onset Date/Surgical Date  --   Chronic   Next MD Visit  none with referring MD  Prior Therapy  Yes      Precautions   Precautions  None      Restrictions   Weight Bearing Restrictions  No      Balance Screen   Has the patient fallen in the past 6 months  No    Has the patient had a decrease in activity level because of a fear of falling?   No    Is the patient reluctant to leave their home because of a fear of falling?   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      Posture/Postural Control   Posture/Postural Control  Postural limitations    Postural Limitations  Rounded Shoulders;Forward head;Increased lumbar lordosis      ROM / Strength   AROM / PROM /  Strength  AROM;Strength      AROM   Overall AROM   Deficits;Due to pain    AROM Assessment Site  Cervical    Cervical Flexion  22    Cervical Extension  24    Cervical - Right Side Bend  30    Cervical - Left Side Bend  29    Cervical - Right Rotation  44    Cervical - Left Rotation  54      Strength   Overall Strength  Deficits;Due to pain    Strength Assessment Site  Shoulder    Right/Left Shoulder  Right;Left    Right Shoulder Flexion  3+/5    Right Shoulder ABduction  3+/5    Right Shoulder Internal Rotation  3+/5    Right Shoulder External Rotation  3+/5    Left Shoulder Flexion  3+/5    Left Shoulder ABduction  3+/5    Left Shoulder Internal Rotation  3+/5    Left Shoulder External Rotation  3+/5      Palpation   Palpation comment  Tenderness to palpation to cervical paraspinals, bilateral UTs, rhomboid region. increased muscle tone in bilateral UTs                Objective measurements completed on examination: See above findings.      [facility] Adult PT Treatment/Exercise - 07/15/19 0001      Modalities   Modalities  Electrical Stimulation;Moist Heat      Moist Heat Therapy   Number Minutes Moist Heat  10 Minutes    Moist Heat Location  Cervical      Electrical Stimulation   Electrical Stimulation Location  cervical paraspinals and bilateral UTs    Electrical Stimulation Action  IFC    Electrical Stimulation Parameters  80-150 hz x10 mins    Electrical Stimulation Goals  Pain                  PT Long Term Goals - 07/15/19 2053      PT LONG TERM GOAL #1   Title  Patient will be independent with HEP    Time  6    Period  Weeks    Status  New      PT LONG TERM GOAL #2   Title  Patient will demonstrate 60+ degrees of cervical rotation AROM to improve ability to look over shoulder while driving    Time  6    Period  Weeks  Status  New      PT LONG TERM GOAL #3   Title  Patient will demonstrate 4/5 or greater bilateral shoulder MMT  in all planes to improve stability during functional tasks.    Time  6    Period  Weeks    Status  New      PT LONG TERM GOAL #4   Title  Patient will report ability to perform all ADLs with pain less than 4/10 in cervical spine and throacic spine.     Time  6    Period  Weeks    Status  New             Plan - 07/15/19 2104    Clinical Impression Statement  Patient is a 28 year old female who presents to physical therapy with cervical, thoracic and right arm pain that is chronic in nature. Patient noted with [facility] right shoulder flexion and ABD but limited shoulder ER and wrist supination. Patient noted with bilateral UE MMT with reports of "tightness" in UTs and shoulders. Patient noted pain upon palpation to mid cervical paraspinals, bilateral UTs, and rhomboids. Patient and PT discussed HEP and plan of care to which patient reported understanding and agreement. Patient would benefit from skilled physical therapy to address deficits and address patient's goals.    Personal Factors and Comorbidities  Comorbidity 2    Comorbidities  amyoplasia congenita, fibromyalgia    Examination-Activity Limitations  Lift;Reach Overhead    Examination-Participation Restrictions  Cleaning    Stability/Clinical Decision Making  Stable/Uncomplicated    Clinical Decision Making  Low    Rehab Potential  Good    PT Frequency  2x / week    PT Duration  6 weeks    PT Treatment/Interventions  ADLs/Self Care Home Management;Electrical Stimulation;Cryotherapy;Traction;Moist Heat;Ultrasound;Neuromuscular re-education;Manual techniques;Dry needling;Passive range of motion;Therapeutic exercise;Therapeutic activities;Patient/family education;Vasopneumatic Device    PT Next Visit Plan  AAROM for right shoulder to best ability, postural exercises, STW/M to cervical spine, modalities PRN for pain relief.    PT Home Exercise Plan  see patient education section.    Consulted and Agree with Plan of Care  Patient        Patient will benefit from skilled therapeutic intervention in order to improve the following deficits and impairments:  Pain, Postural dysfunction, Impaired UE functional use, Decreased strength, Decreased range of motion, Decreased activity tolerance  Visit Diagnosis: Cervicalgia - Plan: PT plan of care cert/re-cert  Pain in thoracic spine - Plan: PT plan of care cert/re-cert  Pain in right arm - Plan: PT plan of care cert/re-cert  Muscle weakness (generalized) - Plan: PT plan of care cert/re-cert     Problem List Patient Active Problem List   Diagnosis Date Noted  . Degenerative disc disease, cervical 10/20/2018  . Chronic bilateral thoracic back pain 10/20/2018  . Musculoskeletal pain of right upper extremity 10/20/2018  . Chronically dry eyes, bilateral 10/20/2018  . Dry mouth 10/20/2018    Gabriela Eves, PT, DPT 07/15/2019, 9:08 PM  [facility] [address] [place], [place], [address] Phone: [phone]   Fax:  [phone]  Name: Shantil Vallejo MRN: [id number] Date of Birth: May 21, 1991

## 2019-07-19 ENCOUNTER — Encounter: Payer: Self-pay | Admitting: Family Medicine

## 2019-07-19 ENCOUNTER — Ambulatory Visit (INDEPENDENT_AMBULATORY_CARE_PROVIDER_SITE_OTHER): Payer: BC Managed Care – PPO | Admitting: Family Medicine

## 2019-07-19 DIAGNOSIS — J069 Acute upper respiratory infection, unspecified: Secondary | ICD-10-CM | POA: Diagnosis not present

## 2019-07-19 MED ORDER — PSEUDOEPH-BROMPHEN-DM 30-2-10 MG/5ML PO SYRP: 5.0000 mL | ORAL_SOLUTION | Freq: Four times a day (QID) | ORAL | 0 refills | Status: DC | PRN

## 2019-07-19 NOTE — Progress Notes (Signed)
Virtual Visit via telephone Note Due to COVID-19 pandemic this visit was conducted virtually. This visit type was conducted due to national recommendations for restrictions regarding the COVID-19 Pandemic (e.g. social distancing, sheltering in place) in an effort to limit this patient's exposure and mitigate transmission in our community. All issues noted in this document were discussed and addressed.  A physical exam was not performed with this format.   I connected with Bethany SealsJohana Deterding on 07/19/19 at 1350 by telephone and verified that I am speaking with the correct person using two identifiers. Bethany SealsJohana Brandner is currently located at home and family is currently with them during visit. The provider, Kari BaarsMichelle Ollie Delano, FNP is located in their office at time of visit.  I discussed the limitations, risks, security and privacy concerns of performing an evaluation and management service by telephone and the availability of in person appointments. I also discussed with the patient that there may be a patient responsible charge related to this service. The patient expressed understanding and agreed to proceed.  Subjective:  Patient ID: Bethany Hall, female    DOB: 06/22/1991, 28 y.o.   MRN: [id number]  Chief Complaint:  URI   HPI: Bethany SealsJohana Scow is a 28 y.o. female presenting on 07/19/2019 for URI   Pt reports cough and congestion with rhinorrhea and sore throat. Pt states this started about 4-5 days ago. Pt was tested for COVID-19 and symptoms were negative. Pt states she does not have a fever or chills. No abdominal pain, nausea, vomiting, or diarrhea. She states she has been taking Dayquil and Nyquil with some relief of symptoms. No known sick exposures or travel.   URI  This is a new problem. The current episode started in the past 7 days. The problem has been waxing and waning. There has been no fever. Associated symptoms include congestion, coughing, headaches, rhinorrhea and a sore throat.  Pertinent negatives include no abdominal pain, chest pain, diarrhea, dysuria, ear pain, joint pain, joint swelling, nausea, neck pain, plugged ear sensation, rash, sinus pain, sneezing, swollen glands, vomiting or wheezing. She has tried decongestant and acetaminophen for the symptoms. The treatment provided mild relief.     Relevant past medical, surgical, family, and social history reviewed and updated as indicated.  Allergies and medications reviewed and updated.   Past Medical History:  Diagnosis Date  . Chronic pain    all over body per patient - no meds  . 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 GERD (gastroesophageal reflux disease)    occas tums prm  . Medical history non-contributory   . Neuromuscular disorder (HCC)    carpel tunnel bilateral    Past Surgical History:  Procedure Laterality Date  . CESAREAN SECTION  2011,2014,2016,02/2017   x 4  . DILATION AND EVACUATION N/A 08/07/2017   Procedure: DILATATION AND EVACUATION WITH ULTRASOUND GUIDANCE ;  Surgeon: Shea EvansMody, Vaishali, MD;  Location: [facility];  Service: Gynecology;  Laterality: N/A;    Social History   Socioeconomic History  . Marital status: Married    Spouse name: Not on file  . Number of children: Not on file  . Years of education: Not on file  . Highest education level: Not on file  Occupational History  . Not on file  Social Needs  . Financial resource strain: Not on file  . Food insecurity    Worry: Not on file    Inability: Not on file  . Transportation needs  Medical: Not on file    Non-medical: Not on file  Tobacco Use  . Smoking status: Never Smoker  . Smokeless tobacco: Never Used  Substance and Sexual Activity  . Alcohol use: No    Comment: occ  . Drug use: No  . Sexual activity: Yes    Birth control/protection: Injection    Comment: approx 7-[redacted] wks gestation per patient  Lifestyle  . Physical activity    Days per week: Not on file    Minutes per session:  Not on file  . Stress: Not on file  Relationships  . Social Musician on phone: Not on file    Gets together: Not on file    Attends religious service: Not on file    Active member of club or organization: Not on file    Attends meetings of clubs or organizations: Not on file    Relationship status: Not on file  . Intimate partner violence    Fear of current or ex partner: Not on file    Emotionally abused: Not on file    Physically abused: Not on file    Forced sexual activity: Not on file  Other Topics Concern  . Not on file  Social History Narrative  . Not on file    Outpatient Encounter Medications as of 07/19/2019  Medication Sig  . brompheniramine-pseudoephedrine-DM 30-2-10 MG/5ML syrup Take 5 mLs by mouth 4 (four) times daily as needed.  Marland Kitchen ibuprofen (MOTRIN IB) 200 MG tablet Take 3 tablets (600 mg total) by mouth every 6 (six) hours as needed.  . medroxyPROGESTERone (DEPO-PROVERA) 150 MG/ML injection Inject 150 mg into the muscle every 3 (three) months.  . [DISCONTINUED] clindamycin (CLEOCIN) 300 MG capsule Take 1 capsule (300 mg total) by mouth 3 (three) times daily.   No facility-administered encounter medications on file as of 07/19/2019.     Allergies  Allergen Reactions  . Penicillins Rash    Has patient had a PCN reaction causing immediate rash, facial/tongue/throat swelling, SOB or lightheadedness with hypotension: Unknown Has patient had a PCN reaction causing severe rash involving mucus membranes or skin necrosis: Unknown Has patient had a PCN reaction that required hospitalization: Unknown Has patient had a PCN reaction occurring within the last 10 years: No If all of the above answers are "NO", then may proceed with Cephalosporin use. Infantile Reaction.    Review of Systems  Constitutional: Negative for activity change, appetite change, chills, diaphoresis, fatigue, fever and unexpected weight change.  HENT: Positive for congestion, rhinorrhea  and sore throat. Negative for ear pain, postnasal drip, sinus pressure, sinus pain, sneezing, trouble swallowing and voice change.   Eyes: Negative.  Negative for photophobia and visual disturbance.  Respiratory: Positive for cough. Negative for chest tightness, shortness of breath and wheezing.   Cardiovascular: Negative for chest pain, palpitations and leg swelling.  Gastrointestinal: Negative for abdominal pain, blood in stool, constipation, diarrhea, nausea and vomiting.  Endocrine: Negative.   Genitourinary: Negative for decreased urine volume, difficulty urinating, dysuria, frequency and urgency.  Musculoskeletal: Negative for arthralgias, joint pain, myalgias and neck pain.  Skin: Negative.  Negative for rash.  Allergic/Immunologic: Negative.   Neurological: Positive for headaches. Negative for dizziness and weakness.  Hematological: Negative.   Psychiatric/Behavioral: Negative for confusion, hallucinations, sleep disturbance and suicidal ideas.  All other systems reviewed and are negative.        Observations/Objective: No vital signs or physical exam, this was a telephone or virtual health encounter.  Pt alert and oriented, answers all questions appropriately, and able to speak in full sentences.    Assessment and Plan: Ivone was seen today for uri.  Diagnoses and all orders for this visit:  URI with cough and congestion Reported symptoms consistent with viral URI. Pt has tested negative for COVID-19. No indication of acute bacterial infection. Symptomatic care discussed in detail. Infection prevention and self quarantine discussed in detail. Pt aware to report any new, worsening, or persistent symptoms. Will treat symptoms with below. Medications as prescribed. Follow up as needed.  -     brompheniramine-pseudoephedrine-DM 30-2-10 MG/5ML syrup; Take 5 mLs by mouth 4 (four) times daily as needed.     Follow Up Instructions: Return if symptoms worsen or fail to improve.     I discussed the assessment and treatment plan with the patient. The patient was provided an opportunity to ask questions and all were answered. The patient agreed with the plan and demonstrated an understanding of the instructions.   The patient was advised to call back or seek an in-person evaluation if the symptoms worsen or if the condition fails to improve as anticipated.  The above assessment and management plan was discussed with the patient. The patient verbalized understanding of and has agreed to the management plan. Patient is aware to call the clinic if they develop any new symptoms or if symptoms persist or worsen. Patient is aware when to return to the clinic for a follow-up visit. Patient educated on when it is appropriate to go to the emergency department.    I provided 15 minutes of non-face-to-face time during this encounter. The call started at 1350. The call ended at 1405. The other time was used for coordination of care.    Monia Pouch, FNP-C [facility] [address] [place], [place] [address] [phone] 07/19/19

## 2019-07-22 ENCOUNTER — Encounter: Payer: Self-pay | Admitting: Physical Therapy

## 2019-07-22 ENCOUNTER — Ambulatory Visit: Payer: BC Managed Care – PPO | Admitting: Physical Therapy

## 2019-07-22 ENCOUNTER — Other Ambulatory Visit: Payer: Self-pay

## 2019-07-22 DIAGNOSIS — M542 Cervicalgia: Secondary | ICD-10-CM | POA: Diagnosis not present

## 2019-07-22 DIAGNOSIS — M546 Pain in thoracic spine: Secondary | ICD-10-CM

## 2019-07-22 DIAGNOSIS — M79601 Pain in right arm: Secondary | ICD-10-CM

## 2019-07-22 DIAGNOSIS — M6281 Muscle weakness (generalized): Secondary | ICD-10-CM

## 2019-07-22 NOTE — Therapy (Signed)
[facility] [address] [place], [place], [address] Phone: [phone]   Fax:  [phone]  Physical Therapy Treatment  Patient Details  Name: Bethany Hall MRN: [id number] Date of Birth: 1991-08-02 Referring Provider (PT): Delynn Flavin, DO   Encounter Date: 07/22/2019  PT End of Session - 07/22/19 1652    Visit Number  2    Number of Visits  12    Date for PT Re-Evaluation  09/02/19    PT Start Time  1646    PT Stop Time  1733    PT Time Calculation (min)  47 min    Activity Tolerance  Patient tolerated treatment well    Behavior During Therapy  [facility] for tasks assessed/performed       Past Medical History:  Diagnosis Date  . Chronic pain    all over body per patient - no meds  . 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 GERD (gastroesophageal reflux disease)    occas tums prm  . Medical history non-contributory   . Neuromuscular disorder (HCC)    carpel tunnel bilateral    Past Surgical History:  Procedure Laterality Date  . CESAREAN SECTION  2011,2014,2016,02/2017   x 4  . DILATION AND EVACUATION N/A 08/07/2017   Procedure: DILATATION AND EVACUATION WITH ULTRASOUND GUIDANCE ;  Surgeon: Shea Evans, MD;  Location: [facility];  Service: Gynecology;  Laterality: N/A;    There were no vitals filed for this visit.  Subjective Assessment - 07/22/19 1650    Subjective  COVID-19 screening performed upon arrival. Patient arrives stating she feels "not too bad today"    Pertinent History  amyoplasia congenita (congenital disorder), fibromyalgia (getting a second opinion)    Limitations  Reading;House hold activities    Diagnostic tests  MRI: cervical DDD per patient report    Patient Stated Goals  decrease neck stiffness and pain, improve ROM of right UE    Currently in Pain?  Yes   did not provide number on pain scale        [facility] PT Assessment - 07/22/19 0001      Assessment   Medical  Diagnosis  Degenerative disc disorder, cervical; Chronic bilateral thoracic pain; Musculoskeletal pain of right UT    Referring Provider (PT)  Delynn Flavin, DO    Next MD Visit  none with referring MD    Prior Therapy  Yes                   [facility] Adult PT Treatment/Exercise - 07/22/19 0001      Exercises   Exercises  Neck;Shoulder      Neck Exercises: Machines for Strengthening   UBE (Upper Arm Bike)  120 RPM x8 minutes      Neck Exercises: Standing   Neck Retraction  20 reps;3 secs    Other Standing Exercises  rows and shoulder extension green XTS 2x10       Shoulder Exercises: Supine   Flexion  AAROM;20 reps;Both      Shoulder Exercises: Standing   Other Standing Exercises  bent over row and T no resistance 2x10 bilaterally      Modalities   Modalities  Electrical Stimulation;Moist Heat      Moist Heat Therapy   Number Minutes Moist Heat  10 Minutes    Moist Heat Location  Cervical      Electrical Stimulation   Electrical Stimulation Location  cervical paraspinals and  bilateral UTs    Electrical Stimulation Action  IFC    Electrical Stimulation Parameters  80-150 hz x 10    Electrical Stimulation Goals  Pain      Neck Exercises: Stretches   Chest Stretch  4 reps;20 seconds   supine; open book                 PT Long Term Goals - 07/15/19 2053      PT LONG TERM GOAL #1   Title  Patient will be independent with HEP    Time  6    Period  Weeks    Status  New      PT LONG TERM GOAL #2   Title  Patient will demonstrate 60+ degrees of cervical rotation AROM to improve ability to look over shoulder while driving    Time  6    Period  Weeks    Status  New      PT LONG TERM GOAL #3   Title  Patient will demonstrate 4/5 or greater bilateral shoulder MMT in all planes to improve stability during functional tasks.    Time  6    Period  Weeks    Status  New      PT LONG TERM GOAL #4   Title  Patient will report ability to perform all  ADLs with pain less than 4/10 in cervical spine and throacic spine.     Time  6    Period  Weeks    Status  New            Plan - 07/22/19 1728    Clinical Impression Statement  Patient responded well to therapy session but with pain and stiffness in cervical and thoracic spine. Patient reported increased tightness during AROM but able to complete with rests breaks. No adverse affects upon removal of modalities.    Personal Factors and Comorbidities  Comorbidity 2    Comorbidities  amyoplasia congenita, fibromyalgia    Examination-Activity Limitations  Lift;Reach Overhead    Examination-Participation Restrictions  Cleaning    Stability/Clinical Decision Making  Stable/Uncomplicated    Clinical Decision Making  Low    Rehab Potential  Good    PT Frequency  2x / week    PT Duration  6 weeks    PT Treatment/Interventions  ADLs/Self Care Home Management;Electrical Stimulation;Cryotherapy;Traction;Moist Heat;Ultrasound;Neuromuscular re-education;Manual techniques;Dry needling;Passive range of motion;Therapeutic exercise;Therapeutic activities;Patient/family education;Vasopneumatic Device    PT Next Visit Plan  AAROM for right shoulder to best ability, postural exercises, STW/M to cervical spine, modalities PRN for pain relief.    Consulted and Agree with Plan of Care  Patient       Patient will benefit from skilled therapeutic intervention in order to improve the following deficits and impairments:  Pain, Postural dysfunction, Impaired UE functional use, Decreased strength, Decreased range of motion, Decreased activity tolerance  Visit Diagnosis: Cervicalgia  Pain in thoracic spine  Pain in right arm  Muscle weakness (generalized)     Problem List Patient Active Problem List   Diagnosis Date Noted  . Degenerative disc disease, cervical 10/20/2018  . Chronic bilateral thoracic back pain 10/20/2018  . Musculoskeletal pain of right upper extremity 10/20/2018  . Chronically  dry eyes, bilateral 10/20/2018  . Dry mouth 10/20/2018    Gabriela Eves, PT, DPT 07/22/2019, 5:42 PM  [facility] [place], [place], [address] Phone: [phone]   Fax:  [phone]  Name: Aquarius Tremper MRN: [id number]  Date of Birth: 03/31/1991

## 2019-07-27 ENCOUNTER — Other Ambulatory Visit: Payer: Self-pay

## 2019-07-27 ENCOUNTER — Encounter: Payer: Self-pay | Admitting: Physical Therapy

## 2019-07-27 ENCOUNTER — Ambulatory Visit: Payer: BC Managed Care – PPO | Admitting: Physical Therapy

## 2019-07-27 DIAGNOSIS — M6281 Muscle weakness (generalized): Secondary | ICD-10-CM

## 2019-07-27 DIAGNOSIS — M542 Cervicalgia: Secondary | ICD-10-CM | POA: Diagnosis not present

## 2019-07-27 DIAGNOSIS — M546 Pain in thoracic spine: Secondary | ICD-10-CM

## 2019-07-27 DIAGNOSIS — M79601 Pain in right arm: Secondary | ICD-10-CM

## 2019-07-27 NOTE — Therapy (Signed)
[facility] [address] [place], [place], [address] Phone: [phone]   Fax:  [phone]  Physical Therapy Treatment  Patient Details  Name: Bethany SealsJohana Hall MRN: [id number] Date of Birth: 09/16/1991 Referring Provider (PT): Delynn FlavinAshly Gottschalk, DO   Encounter Date: 07/27/2019  PT End of Session - 07/27/19 1725    Visit Number  3    Number of Visits  12    Date for PT Re-Evaluation  09/02/19    PT Start Time  1645    PT Stop Time  1735    PT Time Calculation (min)  50 min    Activity Tolerance  Patient tolerated treatment well    Behavior During Therapy  [facility] for tasks assessed/performed       Past Medical History:  Diagnosis Date  . Chronic pain    all over body per patient - no meds  . 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 GERD (gastroesophageal reflux disease)    occas tums prm  . Medical history non-contributory   . Neuromuscular disorder (HCC)    carpel tunnel bilateral    Past Surgical History:  Procedure Laterality Date  . CESAREAN SECTION  2011,2014,2016,02/2017   x 4  . DILATION AND EVACUATION N/A 08/07/2017   Procedure: DILATATION AND EVACUATION WITH ULTRASOUND GUIDANCE ;  Surgeon: Shea EvansMody, Vaishali, MD;  Location: [facility];  Service: Gynecology;  Laterality: N/A;    There were no vitals filed for this visit.  Subjective Assessment - 07/27/19 1706    Subjective  COVID-19 screening performed upon arrival. Patient arrives feeling  good.    Pertinent History  amyoplasia congenita (congenital disorder), fibromyalgia (getting a second opinion)    Limitations  Reading;House hold activities    Diagnostic tests  MRI: cervical DDD per patient report    Patient Stated Goals  decrease neck stiffness and pain, improve ROM of right UE    Currently in Pain?  Yes         [facility] - 07/27/19 0001      Assessment   Medical Diagnosis  Degenerative disc disorder, cervical; Chronic  bilateral thoracic pain; Musculoskeletal pain of right UT    Referring Provider (PT)  Delynn FlavinAshly Gottschalk, DO    Next MD Visit  none with referring MD    Prior Therapy  Yes                   [facility] - 07/27/19 0001      Exercises   Exercises  Neck;Shoulder      Neck Exercises: Machines for Strengthening   UBE (Upper Arm Bike)  120 RPM x8 minutes      Neck Exercises: Supine   Neck Retraction  20 reps;3 secs      Shoulder Exercises: Supine   Protraction  Strengthening;Both;20 reps;Weights    Protraction Weight (lbs)  2    Protraction Limitations  with PVC pipe and 2# ankle weight    Flexion  20 reps;Both;Strengthening    Shoulder Flexion Weight (lbs)  2     Flexion Limitations  with PVC and ankle weight      Shoulder Exercises: Sidelying   Other Sidelying Exercises  open book 2x10 each      Shoulder Exercises: Standing   Other Standing Exercises  bent over row and T 1# 2x10 bilaterally      Shoulder Exercises: Pulleys   Flexion  5 minutes  Shoulder Exercises: ROM/Strengthening   X to V Arms  2x10      Modalities   Modalities  Electrical Stimulation;Moist Heat      Moist Heat Therapy   Number Minutes Moist Heat  10 Minutes    Moist Heat Location  Cervical      Electrical Stimulation   Electrical Stimulation Location  cervical paraspinasl    Electrical Stimulation Action  IFC    Electrical Stimulation Parameters  80-150 hz x10    Electrical Stimulation Goals  Pain                  PT Long Term Goals - 07/15/19 2053      PT LONG TERM GOAL #1   Title  Patient will be independent with HEP    Time  6    Period  Weeks    Status  New      PT LONG TERM GOAL #2   Title  Patient will demonstrate 60+ degrees of cervical rotation AROM to improve ability to look over shoulder while driving    Time  6    Period  Weeks    Status  New      PT LONG TERM GOAL #3   Title  Patient will demonstrate 4/5 or greater bilateral  shoulder MMT in all planes to improve stability during functional tasks.    Time  6    Period  Weeks    Status  New      PT LONG TERM GOAL #4   Title  Patient will report ability to perform all ADLs with pain less than 4/10 in cervical spine and throacic spine.     Time  6    Period  Weeks    Status  New            Plan - 07/27/19 1726    Clinical Impression Statement  Patient responded well to progression of TEs. Patient still has stiffness with cervical and shoulder ROM but reported an ease of movement. No adverse affects upon removal of modalities.    Personal Factors and Comorbidities  Comorbidity 2    Comorbidities  amyoplasia congenita, fibromyalgia    Examination-Activity Limitations  Lift;Reach Overhead    Examination-Participation Restrictions  Cleaning    Stability/Clinical Decision Making  Stable/Uncomplicated    Clinical Decision Making  Low    Rehab Potential  Good    PT Frequency  2x / week    PT Duration  6 weeks    PT Treatment/Interventions  ADLs/Self Care Home Management;Electrical Stimulation;Cryotherapy;Traction;Moist Heat;Ultrasound;Neuromuscular re-education;Manual techniques;Dry needling;Passive range of motion;Therapeutic exercise;Therapeutic activities;Patient/family education;Vasopneumatic Device    PT Next Visit Plan  AAROM for right shoulder to best ability, postural exercises, STW/M to cervical spine, modalities PRN for pain relief.    Consulted and Agree with Plan of Care  Patient       Patient will benefit from skilled therapeutic intervention in order to improve the following deficits and impairments:  Pain, Postural dysfunction, Impaired UE functional use, Decreased strength, Decreased range of motion, Decreased activity tolerance  Visit Diagnosis: Cervicalgia  Pain in thoracic spine  Pain in right arm  Muscle weakness (generalized)     Problem List Patient Active Problem List   Diagnosis Date Noted  . Degenerative disc disease,  cervical 10/20/2018  . Chronic bilateral thoracic back pain 10/20/2018  . Musculoskeletal pain of right upper extremity 10/20/2018  . Chronically dry eyes, bilateral 10/20/2018  . Dry mouth 10/20/2018  Guss Bunde, PT, DPT 07/27/2019, 5:53 PM  [facility] [address] [place], [place], [address] Phone: [phone]   Fax:  [phone]  Name: Bethany Hall MRN: [id number] Date of Birth: Jan 02, 1991

## 2019-08-05 ENCOUNTER — Telehealth: Payer: Self-pay | Admitting: Family Medicine

## 2019-08-05 ENCOUNTER — Encounter: Payer: BC Managed Care – PPO | Admitting: Physical Therapy

## 2019-08-05 MED ORDER — FLUCONAZOLE 150 MG PO TABS: 150.0000 mg | ORAL_TABLET | ORAL | 0 refills | Status: DC | PRN

## 2019-08-05 NOTE — Telephone Encounter (Signed)
Diflucan Prescription sent to pharmacy   

## 2019-08-05 NOTE — Telephone Encounter (Signed)
Left message - rx requested was sent to pharmacy.

## 2019-08-10 ENCOUNTER — Ambulatory Visit (INDEPENDENT_AMBULATORY_CARE_PROVIDER_SITE_OTHER): Payer: BC Managed Care – PPO | Admitting: Family

## 2019-08-10 ENCOUNTER — Ambulatory Visit: Payer: BC Managed Care – PPO | Admitting: Physical Therapy

## 2019-08-10 ENCOUNTER — Other Ambulatory Visit: Payer: Self-pay

## 2019-08-10 ENCOUNTER — Encounter: Payer: Self-pay | Admitting: Family

## 2019-08-10 DIAGNOSIS — K219 Gastro-esophageal reflux disease without esophagitis: Secondary | ICD-10-CM | POA: Diagnosis not present

## 2019-08-10 DIAGNOSIS — J029 Acute pharyngitis, unspecified: Secondary | ICD-10-CM

## 2019-08-10 MED ORDER — OMEPRAZOLE 20 MG PO CPDR: DELAYED_RELEASE_CAPSULE | ORAL | 3 refills | Status: DC

## 2019-08-10 MED ORDER — AZITHROMYCIN 250 MG PO TABS: ORAL_TABLET | ORAL | 0 refills | Status: DC

## 2019-08-10 NOTE — Progress Notes (Signed)
Virtual Visit via telephone Note Due to COVID-19 pandemic this visit was conducted virtually. This visit type was conducted due to national recommendations for restrictions regarding the COVID-19 Pandemic (e.g. social distancing, sheltering in place) in an effort to limit this patient's exposure and mitigate transmission in our community. All issues noted in this document were discussed and addressed.  A physical exam was not performed with this format.  I connected with Bethany Hall on 08/10/19 at 11:04 AM by telephone and verified that I am speaking with the correct person using two identifiers. [name] is currently located at work  and no one is currently with her during visit. The provider, Jannifer Rodney, FNP is located in their office at time of visit.  I discussed the limitations, risks, security and privacy concerns of performing an evaluation and management service by telephone and the availability of in person appointments. I also discussed with the patient that there may be a patient responsible charge related to this service. The patient expressed understanding and agreed to proceed.   History and Present Illness:  Sore Throat  The current episode started 1 to 4 weeks ago (2 weeks). There has been no fever. The pain is at a severity of 8/10. Associated symptoms include abdominal pain, coughing and ear pain (left ear pain). Pertinent negatives include no ear discharge, headaches, hoarse voice or swollen glands. She has tried gargles and acetaminophen for the symptoms. The treatment provided mild relief.  Gastroesophageal Reflux She complains of abdominal pain, belching, choking, coughing, dysphagia, heartburn and a sore throat. She reports no hoarse voice. This is a new problem. The current episode started more than 1 month ago. The problem occurs constantly. The problem has been waxing and waning. The heartburn is of moderate intensity. The symptoms are aggravated by certain  foods. She has tried an antacid for the symptoms. The treatment provided mild relief.      Review of Systems  HENT: Positive for ear pain (left ear pain) and sore throat. Negative for ear discharge and hoarse voice.   Respiratory: Positive for cough and choking.   Gastrointestinal: Positive for abdominal pain, dysphagia and heartburn.  Neurological: Negative for headaches.  All other systems reviewed and are negative.    Observations/Objective: No SOB or distress noted  Assessment and Plan: 1. Acute pharyngitis, unspecified etiology - Take meds as prescribed - Use a cool mist humidifier  -Use saline nose sprays frequently -Force fluids -For fever or aces or pains- take tylenol or ibuprofen. -Throat lozenges if help -New toothbrush in 3 days - azithromycin (ZITHROMAX) 250 MG tablet; Take 500 mg once, then 250 mg for four days  Dispense: 6 tablet; Refill: 0  2. Gastroesophageal reflux disease, unspecified whether esophagitis present -Diet discussed- Avoid fried, spicy, citrus foods, caffeine and alcohol -Do not eat 2-3 hours before bedtime -Encouraged small frequent meals -Avoid NSAID's Keep follow up with PCP - omeprazole (PRILOSEC) 20 MG capsule; Take 1 capsule (20 mg total) by mouth 2 (two) times daily before a meal for 14 days, THEN 1 capsule (20 mg total) daily for 14 days.  Dispense: 60 capsule; Refill: 3     I discussed the assessment and treatment plan with the patient. The patient was provided an opportunity to ask questions and all were answered. The patient agreed with the plan and demonstrated an understanding of the instructions.   The patient was advised to call back or seek an in-person evaluation if the symptoms worsen or if the condition  fails to improve as anticipated.  The above assessment and management plan was discussed with the patient. The patient verbalized understanding of and has agreed to the management plan. Patient is aware to call the clinic if  symptoms persist or worsen. Patient is aware when to return to the clinic for a follow-up visit. Patient educated on when it is appropriate to go to the emergency department.   Time call ended:  11:20 AM  I provided 16 minutes of non-face-to-face time during this encounter.    Evelina Dun, FNP

## 2019-08-18 ENCOUNTER — Ambulatory Visit: Payer: BC Managed Care – PPO | Admitting: Physical Therapy

## 2019-08-19 ENCOUNTER — Ambulatory Visit: Payer: BC Managed Care – PPO | Attending: Family Medicine | Admitting: Physical Therapy

## 2019-08-19 ENCOUNTER — Other Ambulatory Visit: Payer: Self-pay

## 2019-08-19 ENCOUNTER — Encounter: Payer: Self-pay | Admitting: Physical Therapy

## 2019-08-19 DIAGNOSIS — M546 Pain in thoracic spine: Secondary | ICD-10-CM | POA: Diagnosis present

## 2019-08-19 DIAGNOSIS — M542 Cervicalgia: Secondary | ICD-10-CM | POA: Diagnosis not present

## 2019-08-19 DIAGNOSIS — M79601 Pain in right arm: Secondary | ICD-10-CM | POA: Diagnosis present

## 2019-08-19 DIAGNOSIS — M6281 Muscle weakness (generalized): Secondary | ICD-10-CM | POA: Diagnosis present

## 2019-08-19 NOTE — Therapy (Addendum)
[facility] [place], [place], [address] Phone: [phone]   Fax:  [phone]  Physical Therapy Treatment PHYSICAL THERAPY DISCHARGE SUMMARY  Visits from Start of Care: 4  Current functional level related to goals / functional outcomes: See below   Remaining deficits: See goals   Education / Equipment: HEP Plan: Patient agrees to discharge.  Patient goals were not met. Patient is being discharged due to not returning since the last visit.  ?????    Gabriela Eves, PT, DPT 11/25/19    Patient Details  Name: Bethany Hall MRN: [id number] Date of Birth: November 29, 1990 Referring Provider (PT): Ronnie Doss, DO   Encounter Date: 08/19/2019  PT End of Session - 08/19/19 1722    Visit Number  4    Number of Visits  12    Date for PT Re-Evaluation  09/02/19    PT Start Time  3825    PT Stop Time  1733    PT Time Calculation (min)  48 min    Activity Tolerance  Patient tolerated treatment well    Behavior During Therapy  [facility] for tasks assessed/performed       Past Medical History:  Diagnosis Date  . Chronic pain    all over body per patient - no meds  . 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 GERD (gastroesophageal reflux disease)    occas tums prm  . Medical history non-contributory   . Neuromuscular disorder ([facility])    carpel tunnel bilateral    Past Surgical History:  Procedure Laterality Date  . CESAREAN SECTION  2011,2014,2016,02/2017   x 4  . DILATION AND EVACUATION N/A 08/07/2017   Procedure: DILATATION AND EVACUATION WITH ULTRASOUND GUIDANCE ;  Surgeon: Azucena Fallen, MD;  Location: [facility];  Service: Gynecology;  Laterality: N/A;    There were no vitals filed for this visit.  Subjective Assessment - 08/19/19 1658    Subjective  COVID-19 screening performed upon arrival. Patient arrives feeling good today with minimal pain just some stiffness the neck.    Pertinent  History  amyoplasia congenita (congenital disorder), fibromyalgia (getting a second opinion)    Limitations  Reading;House hold activities    Diagnostic tests  MRI: cervical DDD per patient report    Patient Stated Goals  decrease neck stiffness and pain, improve ROM of right UE    Currently in Pain?  Yes   did not provide number on pain scale        [facility] PT Assessment - 08/19/19 0001      Assessment   Medical Diagnosis  Degenerative disc disorder, cervical; Chronic bilateral thoracic pain; Musculoskeletal pain of right UT    Referring Provider (PT)  Ronnie Doss, DO    Next MD Visit  none with referring MD    Prior Therapy  Yes                   [facility] Adult PT Treatment/Exercise - 08/19/19 0001      Exercises   Exercises  Neck;Shoulder      Neck Exercises: Machines for Strengthening   UBE (Upper Arm Bike)  120 RPM x8 minutes      Neck Exercises: Standing   Other Standing Exercises  rows and shoulder extension green XTS 2x10     Other Standing Exercises  chest press green XTS 2x10      Neck Exercises: Seated   Other Seated Exercise  lat pull downs x20 green XSTS      Shoulder Exercises: Standing   Flexion  Both;20 reps    ABduction  AROM;Both;20 reps   scaption   Other Standing Exercises  bent over row and extension 3x10      Shoulder Exercises: Pulleys   Flexion  5 minutes      Modalities   Modalities  Electrical Stimulation;Moist Heat      Moist Heat Therapy   Number Minutes Moist Heat  10 Minutes    Moist Heat Location  Cervical      Electrical Stimulation   Electrical Stimulation Location  cervical paraspinals    Electrical Stimulation Action  IFC    Electrical Stimulation Parameters  80-150 hz x10 mins    Electrical Stimulation Goals  Pain      Neck Exercises: Stretches   Corner Stretch  4 reps;30 seconds                  PT Long Term Goals - 08/19/19 1737      PT LONG TERM GOAL #1   Title  Patient will be independent  with HEP    Time  6    Period  Weeks    Status  On-going      PT LONG TERM GOAL #2   Title  Patient will demonstrate 60+ degrees of cervical rotation AROM to improve ability to look over shoulder while driving    Period  Weeks    Status  On-going      PT LONG TERM GOAL #3   Title  Patient will demonstrate 4/5 or greater bilateral shoulder MMT in all planes to improve stability during functional tasks.    Time  6    Period  Weeks    Status  On-going      PT LONG TERM GOAL #4   Title  Patient will report ability to perform all ADLs with pain less than 4/10 in cervical spine and throacic spine.     Time  6    Period  Weeks    Status  On-going            Plan - 08/19/19 1728    Clinical Impression Statement  Patient was able to tolerate treatment wel but did report an increase of tightness in cervical spine. Patient educated on finding balance of strengthening and stretching to improve pain and posture. Patient reported understanding.    Personal Factors and Comorbidities  Comorbidity 2    Comorbidities  amyoplasia congenita, fibromyalgia    Examination-Activity Limitations  Lift;Reach Overhead    Examination-Participation Restrictions  Cleaning    Stability/Clinical Decision Making  Stable/Uncomplicated    Clinical Decision Making  Low    Rehab Potential  Good    PT Frequency  2x / week    PT Duration  6 weeks    PT Treatment/Interventions  ADLs/Self Care Home Management;Electrical Stimulation;Cryotherapy;Traction;Moist Heat;Ultrasound;Neuromuscular re-education;Manual techniques;Dry needling;Passive range of motion;Therapeutic exercise;Therapeutic activities;Patient/family education;Vasopneumatic Device    PT Next Visit Plan  AAROM for right shoulder to best ability, postural exercises, STW/M to cervical spine, modalities PRN for pain relief.    PT Home Exercise Plan  see patient education section.    Consulted and Agree with Plan of Care  Patient       Patient will  benefit from skilled therapeutic intervention in order to improve the following deficits and impairments:  Pain, Postural dysfunction, Impaired UE functional use, Decreased strength, Decreased range of motion, Decreased  activity tolerance  Visit Diagnosis: Cervicalgia  Pain in thoracic spine  Pain in right arm  Muscle weakness (generalized)     Problem List Patient Active Problem List   Diagnosis Date Noted  . Degenerative disc disease, cervical 10/20/2018  . Chronic bilateral thoracic back pain 10/20/2018  . Musculoskeletal pain of right upper extremity 10/20/2018  . Chronically dry eyes, bilateral 10/20/2018  . Dry mouth 10/20/2018    Gabriela Eves, PT, DPT 08/19/2019, 5:42 PM  [facility] [place], [place], [address] Phone: [phone]   Fax:  [phone]  Name: Bethany Hall MRN: [id number] Date of Birth: 04-02-1991

## 2019-08-23 ENCOUNTER — Encounter: Payer: Self-pay | Admitting: Family Medicine

## 2019-08-23 ENCOUNTER — Ambulatory Visit (INDEPENDENT_AMBULATORY_CARE_PROVIDER_SITE_OTHER): Payer: BC Managed Care – PPO | Admitting: Family Medicine

## 2019-08-23 ENCOUNTER — Other Ambulatory Visit: Payer: Self-pay

## 2019-08-23 DIAGNOSIS — J358 Other chronic diseases of tonsils and adenoids: Secondary | ICD-10-CM | POA: Diagnosis not present

## 2019-08-23 DIAGNOSIS — H938X2 Other specified disorders of left ear: Secondary | ICD-10-CM | POA: Diagnosis not present

## 2019-08-23 DIAGNOSIS — H9312 Tinnitus, left ear: Secondary | ICD-10-CM | POA: Diagnosis not present

## 2019-08-23 MED ORDER — CEFDINIR 300 MG PO CAPS: 300.0000 mg | ORAL_CAPSULE | Freq: Two times a day (BID) | ORAL | 0 refills | Status: DC

## 2019-08-23 MED ORDER — PREDNISONE 20 MG PO TABS: ORAL_TABLET | ORAL | 0 refills | Status: DC

## 2019-08-23 MED ORDER — CETIRIZINE HCL 10 MG PO TABS: 10.0000 mg | ORAL_TABLET | Freq: Every day | ORAL | 11 refills | Status: DC

## 2019-08-23 NOTE — Progress Notes (Signed)
Virtual Visit via telephone Note  I connected with Bethany Hall on 08/23/19 at 1052 by telephone and verified that I am speaking with the correct person using two identifiers. [name] is currently located at home and no other people are currently with her during visit. The provider, Elige Radon , MD is located in their office at time of visit.  Call ended at 1105  I discussed the limitations, risks, security and privacy concerns of performing an evaluation and management service by telephone and the availability of in person appointments. I also discussed with the patient that there may be a patient responsible charge related to this service. The patient expressed understanding and agreed to proceed.   History and Present Illness: Patient is calling in for ringing in her ears and thought it was from a cavity and got that fixed and she has a sore throat from tonsil stones and is spitting them up a lot. The left ear is having pain and ringing.  She has sore throat when eating from the tonsil stones.   No diagnosis found.  Outpatient Encounter Medications as of 08/23/2019  Medication Sig  . azithromycin (ZITHROMAX) 250 MG tablet Take 500 mg once, then 250 mg for four days  . medroxyPROGESTERone (DEPO-PROVERA) 150 MG/ML injection Inject 150 mg into the muscle every 3 (three) months.  Marland Kitchen omeprazole (PRILOSEC) 20 MG capsule Take 1 capsule (20 mg total) by mouth 2 (two) times daily before a meal for 14 days, THEN 1 capsule (20 mg total) daily for 14 days.   No facility-administered encounter medications on file as of 08/23/2019.     Review of Systems  Constitutional: Negative for chills and fever.  HENT: Positive for congestion, ear pain and sore throat. Negative for ear discharge, facial swelling, mouth sores, postnasal drip, rhinorrhea, sinus pressure and sneezing.   Eyes: Negative for visual disturbance.  Respiratory: Negative for chest tightness and shortness of breath.    Cardiovascular: Negative for chest pain and leg swelling.  Musculoskeletal: Negative for back pain and gait problem.  Skin: Negative for rash.  Neurological: Negative for light-headedness and headaches.  Psychiatric/Behavioral: Negative for agitation and behavioral problems.  All other systems reviewed and are negative.   Observations/Objective: Patient sounds comfortable and in no acute distress  Assessment and Plan: Problem List Items Addressed This Visit    None    Visit Diagnoses    Tonsil stone    -  Primary   Relevant Medications   cefdinir (OMNICEF) 300 MG capsule   predniSONE (DELTASONE) 20 MG tablet   cetirizine (ZYRTEC) 10 MG tablet   Ear pressure, left       Relevant Medications   cefdinir (OMNICEF) 300 MG capsule   predniSONE (DELTASONE) 20 MG tablet   cetirizine (ZYRTEC) 10 MG tablet   Tinnitus of left ear       Relevant Medications   cefdinir (OMNICEF) 300 MG capsule   predniSONE (DELTASONE) 20 MG tablet   cetirizine (ZYRTEC) 10 MG tablet       Follow Up Instructions: Recommended to the antibiotic and the steroid and then keeping the allergy pill going for the tonsil stones, if the ear stuff comes right back that we may go to ENT after the antibiotic.    I discussed the assessment and treatment plan with the patient. The patient was provided an opportunity to ask questions and all were answered. The patient agreed with the plan and demonstrated an understanding of the instructions.   The  patient was advised to call back or seek an in-person evaluation if the symptoms worsen or if the condition fails to improve as anticipated.  The above assessment and management plan was discussed with the patient. The patient verbalized understanding of and has agreed to the management plan. Patient is aware to call the clinic if symptoms persist or worsen. Patient is aware when to return to the clinic for a follow-up visit. Patient educated on when it is appropriate to go to  the emergency department.    I provided 13 minutes of non-face-to-face time during this encounter.    Worthy Rancher, MD

## 2019-08-24 ENCOUNTER — Encounter: Payer: BC Managed Care – PPO | Admitting: Physical Therapy

## 2019-08-31 ENCOUNTER — Ambulatory Visit (INDEPENDENT_AMBULATORY_CARE_PROVIDER_SITE_OTHER): Payer: BC Managed Care – PPO | Admitting: Family Medicine

## 2019-08-31 DIAGNOSIS — R0989 Other specified symptoms and signs involving the circulatory and respiratory systems: Secondary | ICD-10-CM | POA: Diagnosis not present

## 2019-08-31 DIAGNOSIS — N3000 Acute cystitis without hematuria: Secondary | ICD-10-CM | POA: Diagnosis not present

## 2019-08-31 DIAGNOSIS — J358 Other chronic diseases of tonsils and adenoids: Secondary | ICD-10-CM | POA: Diagnosis not present

## 2019-08-31 MED ORDER — NITROFURANTOIN MONOHYD MACRO 100 MG PO CAPS: 100.0000 mg | ORAL_CAPSULE | Freq: Two times a day (BID) | ORAL | 0 refills | Status: AC

## 2019-08-31 MED ORDER — FLUCONAZOLE 150 MG PO TABS: 150.0000 mg | ORAL_TABLET | Freq: Once | ORAL | 0 refills | Status: AC

## 2019-08-31 NOTE — Progress Notes (Signed)
Telephone visit  Subjective: CC: UTi PCP: Janora Norlander, DO EHU:[id number] Bethany Hall is a 28 y.o. female calls for telephone consult today. Patient provides verbal consent for consult held via phone.  Location of patient: work Location of provider: Working remotely from home Others present for call: none  1. Urinary symptoms Patient reports a 3 day h/o dysuria, urinary frequency, and urgency.  No hematuria, fevers, chills, abdominal pain, nausea, vomiting.  She reports mild low back pain.  She reports vagina itching and vaginal discharge.   2.  Globus sensation/tonsil stone Patient with ongoing globus sensation.  She is currently being treated with Omnicef and prednisone for tonsil stones and presumed URI.  She is status post treatment with 2 antibiotics now.  She is had negative Covid testing x2.  She is not having any other infectious symptoms.  She would like to proceed with ENT evaluation.  ROS: Per HPI  Allergies  Allergen Reactions  . Penicillins Rash    Has patient had a PCN reaction causing immediate rash, facial/tongue/throat swelling, SOB or lightheadedness with hypotension: Unknown Has patient had a PCN reaction causing severe rash involving mucus membranes or skin necrosis: Unknown Has patient had a PCN reaction that required hospitalization: Unknown Has patient had a PCN reaction occurring within the last 10 years: No If all of the above answers are "NO", then may proceed with Cephalosporin use. Infantile Reaction.   Past Medical History:  Diagnosis Date  . Chronic pain    all over body per patient - no meds  . 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 GERD (gastroesophageal reflux disease)    occas tums prm  . Medical history non-contributory   . Neuromuscular disorder ([facility])    carpel tunnel bilateral    Current Outpatient Medications:  .  cefdinir (OMNICEF) 300 MG capsule, Take 1 capsule (300 mg total) by mouth 2 (two) times daily. 1  po BID, Disp: 20 capsule, Rfl: 0 .  cetirizine (ZYRTEC) 10 MG tablet, Take 1 tablet (10 mg total) by mouth daily., Disp: 30 tablet, Rfl: 11 .  omeprazole (PRILOSEC) 20 MG capsule, Take 1 capsule (20 mg total) by mouth 2 (two) times daily before a meal for 14 days, THEN 1 capsule (20 mg total) daily for 14 days., Disp: 60 capsule, Rfl: 3 .  predniSONE (DELTASONE) 20 MG tablet, 2 po at same time daily for 5 days, Disp: 10 tablet, Rfl: 0  Assessment/ Plan: 28 y.o. female   1. Acute cystitis without hematuria Or empirically treat for presumed urinary tract infection.  She will come in to drop off urine sample tomorrow.  Will send for culture.  I have also sent in Diflucan tablets for presumed yeast infection. - nitrofurantoin, macrocrystal-monohydrate, (MACROBID) 100 MG capsule; Take 1 capsule (100 mg total) by mouth 2 (two) times daily for 5 days.  Dispense: 10 capsule; Refill: 0 - fluconazole (DIFLUCAN) 150 MG tablet; Take 1 tablet (150 mg total) by mouth once for 1 dose. May repeat in 3 days if needed.  Dispense: 2 tablet; Refill: 0  2. Tonsil stone Currently undergoing treatment for pharyngitis.  She continues to have symptoms.  Requests referral to ENT.  This is been placed - Ambulatory referral to ENT  3. Globus sensation - Ambulatory referral to ENT   Start time: 10:55am End time: 11:03a  Total time spent on patient care (including telephone call/ virtual visit): 13 minutes  [place], DO [facility] [phone]

## 2019-10-25 ENCOUNTER — Ambulatory Visit (INDEPENDENT_AMBULATORY_CARE_PROVIDER_SITE_OTHER): Payer: BC Managed Care – PPO | Admitting: Physician Assistant

## 2019-10-25 ENCOUNTER — Encounter: Payer: Self-pay | Admitting: Physician Assistant

## 2019-10-25 DIAGNOSIS — N3000 Acute cystitis without hematuria: Secondary | ICD-10-CM | POA: Diagnosis not present

## 2019-10-25 MED ORDER — FLUCONAZOLE 150 MG PO TABS: 150.0000 mg | ORAL_TABLET | Freq: Once | ORAL | 0 refills | Status: AC

## 2019-10-25 MED ORDER — NITROFURANTOIN MONOHYD MACRO 100 MG PO CAPS: 100.0000 mg | ORAL_CAPSULE | Freq: Two times a day (BID) | ORAL | 0 refills | Status: DC

## 2019-10-25 NOTE — Progress Notes (Signed)
302     Telephone visit  Subjective: CC:uti PCP: Raliegh Ip, DO KKX:[id number] Bethany Hall is a 29 y.o. female calls for telephone consult today. Patient provides verbal consent for consult held via phone.  Patient is identified with 2 separate identifiers.  At this time the entire area is on COVID-19 social distancing and stay home orders are in place.  Patient is of higher risk and therefore we are performing this by a virtual method.  Location of patient: home Location of provider: HOME Others present for call: no  This patient has had 3 days of dysuria, frequency and nocturia. There is also pain over the bladder in the suprapubic region, no back pain. Denies leakage or hematuria.  Denies fever or chills. No pain in flank area.    ROS: Per HPI  Allergies  Allergen Reactions  . Penicillins Rash    Has patient had a PCN reaction causing immediate rash, facial/tongue/throat swelling, SOB or lightheadedness with hypotension: Unknown Has patient had a PCN reaction causing severe rash involving mucus membranes or skin necrosis: Unknown Has patient had a PCN reaction that required hospitalization: Unknown Has patient had a PCN reaction occurring within the last 10 years: No If all of the above answers are "NO", then may proceed with Cephalosporin use. Infantile Reaction.   Past Medical History:  Diagnosis Date  . Chronic pain    all over body per patient - no meds  . 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 GERD (gastroesophageal reflux disease)    occas tums prm  . Medical history non-contributory   . Neuromuscular disorder ([facility])    carpel tunnel bilateral    Current Outpatient Medications:  .  cefdinir (OMNICEF) 300 MG capsule, Take 1 capsule (300 mg total) by mouth 2 (two) times daily. 1 po BID, Disp: 20 capsule, Rfl: 0 .  cetirizine (ZYRTEC) 10 MG tablet, Take 1 tablet (10 mg total) by mouth daily., Disp: 30 tablet, Rfl: 11 .  omeprazole  (PRILOSEC) 20 MG capsule, Take 1 capsule (20 mg total) by mouth 2 (two) times daily before a meal for 14 days, THEN 1 capsule (20 mg total) daily for 14 days., Disp: 60 capsule, Rfl: 3 .  predniSONE (DELTASONE) 20 MG tablet, 2 po at same time daily for 5 days, Disp: 10 tablet, Rfl: 0  Assessment/ Plan: 29 y.o. female   1. Acute cystitis without hematuria - nitrofurantoin, macrocrystal-monohydrate, (MACROBID) 100 MG capsule; Take 1 capsule (100 mg total) by mouth 2 (two) times daily. 1 po BId  Dispense: 14 capsule; Refill: 0 - fluconazole (DIFLUCAN) 150 MG tablet; Take 1 tablet (150 mg total) by mouth once for 1 dose.  Dispense: 1 tablet; Refill: 0   No follow-ups on file.  Continue all other maintenance medications as listed above.  Start time 2:59 PM End time: 3:06 PM  No orders of the defined types were placed in this encounter.   Prudy Feeler PA-C [facility] [phone]

## 2019-11-18 ENCOUNTER — Other Ambulatory Visit: Payer: Self-pay

## 2019-11-18 ENCOUNTER — Telehealth: Payer: Self-pay | Admitting: Family Medicine

## 2019-11-18 NOTE — Telephone Encounter (Signed)
Aware. He is not on hippa but he understands office protocols that patient's with covid symptoms can not come in office.

## 2019-11-18 NOTE — Telephone Encounter (Signed)
Aware.  We  are sorry about the inconvenience of doing televist.  Ear problems are a covid symptom.She has had an ENT referral but she did not go when it was moved from [place] to [employer].

## 2019-11-18 NOTE — Telephone Encounter (Signed)
Urgent medical question in reference to patient. Call cell number at earliest convenience.

## 2019-11-22 ENCOUNTER — Ambulatory Visit (INDEPENDENT_AMBULATORY_CARE_PROVIDER_SITE_OTHER): Payer: BC Managed Care – PPO | Admitting: Family Medicine

## 2019-11-22 DIAGNOSIS — H9312 Tinnitus, left ear: Secondary | ICD-10-CM | POA: Diagnosis not present

## 2019-11-22 MED ORDER — CIPROFLOXACIN-DEXAMETHASONE 0.3-0.1 % OT SUSP: 4.0000 [drp] | Freq: Two times a day (BID) | OTIC | 0 refills | Status: AC

## 2019-11-22 NOTE — Progress Notes (Signed)
Telephone visit  Subjective: CC: Tinnitus, dizziness PCP: Janora Norlander, DO OMA:[name] is a 29 y.o. female calls for telephone consult today. Patient provides verbal consent for consult held via phone.  Due to COVID-19 pandemic this visit was conducted virtually. This visit type was conducted due to national recommendations for restrictions regarding the COVID-19 Pandemic (e.g. social distancing, sheltering in place) in an effort to limit this patient's exposure and mitigate transmission in our community. All issues noted in this document were discussed and addressed.  A physical exam was not performed with this format.   Location of patient: home Location of provider: Working remotely from home Others present for call: none  1. Tinnitus, dizziness Patient reports ongoing tinnitus for over 1 month.  She tried to get ENT appointment but her appointment was rescheduled because the .  She reports intermittent dizziness that occurs at random.  She describes this as lightheadedness followed by ringing and sharp ear pain on the left.  The ringing and lightheadedness do not always occur together.  Dizziness is not long lasting (5-10 minutes, relieved by sugar).  She reports decreased hearing in left ear.  She reports early satiety but no nausea/ vomiting.  No other antihistamines except Zyrtec.  Denies rhinorrhea, sinusitis.  No known family history of inner ear disorder including Mnire's disease.  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  She wonders if she needs an antibiotic.   ROS: Per HPI  Allergies  Allergen Reactions  . Penicillins Rash    Has patient had a PCN reaction causing immediate rash, facial/tongue/throat swelling, SOB or lightheadedness with hypotension: Unknown Has patient had a PCN reaction causing severe rash involving mucus membranes or skin necrosis: Unknown Has patient had a PCN reaction  that required hospitalization: Unknown Has patient had a PCN reaction occurring within the last 10 years: No If all of the above answers are "NO", then may proceed with Cephalosporin use. Infantile Reaction.   Past Medical History:  Diagnosis Date  . Chronic pain    all over body per patient - no meds  . 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 GERD (gastroesophageal reflux disease)    occas tums prm  . Medical history non-contributory   . Neuromuscular disorder ([place])    carpel tunnel bilateral    Current Outpatient Medications:  .  cetirizine (ZYRTEC) 10 MG tablet, Take 1 tablet (10 mg total) by mouth daily., Disp: 30 tablet, Rfl: 11 .  omeprazole (PRILOSEC) 20 MG capsule, Take 1 capsule (20 mg total) by mouth 2 (two) times daily before a meal for 14 days, THEN 1 capsule (20 mg total) daily for 14 days., Disp: 60 capsule, Rfl: 3  Assessment/ Plan: 29 y.o. female   1. Tinnitus of left ear Given longevity, I do recommend that she be seen by ENT.  I have gone ahead and referred her to a different office since her appointment was mistakenly made an office that was no longer seeing patients.  We will see if we can get her seen at [facility] ear nose [facility].  I have empirically treated her for otitis externa given her work nurses evaluation of her eardrum.  Ciprodex has been sent to the pharmacy.  We discussed that if she has any worsening symptoms or concerns to contact our office. - Ambulatory referral to ENT - ciprofloxacin-dexamethasone ([place]) OTIC  suspension; Place 4 drops into the left ear 2 (two) times daily for 7 days.  Dispense: 7.5 mL; Refill: 0   Start time: 3:41pm (LVM); 3:47pm  End time: 3:55pm  Total time spent on patient care (including telephone call/ virtual visit): 15 minutes  Bethany Schmuck Hulen Skains, DO [facility] [phone]

## 2019-11-22 NOTE — Patient Instructions (Signed)

## 2019-12-20 ENCOUNTER — Ambulatory Visit: Payer: BC Managed Care – PPO | Attending: Internal Medicine

## 2019-12-20 ENCOUNTER — Other Ambulatory Visit: Payer: Self-pay

## 2019-12-20 DIAGNOSIS — Z20822 Contact with and (suspected) exposure to covid-19: Secondary | ICD-10-CM

## 2019-12-21 ENCOUNTER — Telehealth (INDEPENDENT_AMBULATORY_CARE_PROVIDER_SITE_OTHER): Payer: BC Managed Care – PPO | Admitting: Family Medicine

## 2019-12-21 ENCOUNTER — Encounter: Payer: Self-pay | Admitting: Family Medicine

## 2019-12-21 DIAGNOSIS — N898 Other specified noninflammatory disorders of vagina: Secondary | ICD-10-CM

## 2019-12-21 DIAGNOSIS — R300 Dysuria: Secondary | ICD-10-CM | POA: Diagnosis not present

## 2019-12-21 LAB — NOVEL CORONAVIRUS, NAA: SARS-CoV-2, NAA: NOT DETECTED

## 2019-12-21 NOTE — Progress Notes (Signed)
Virtual Visit via telephone Note Due to COVID-19 pandemic this visit was conducted virtually. This visit type was conducted due to national recommendations for restrictions regarding the COVID-19 Pandemic (e.g. social distancing, sheltering in place) in an effort to limit this patient's exposure and mitigate transmission in our community. All issues noted in this document were discussed and addressed.  A physical exam was not performed with this format.   I connected with [name] on 12/21/2019 at 1525 by telephone and verified that I am speaking with the correct person using two identifiers. Keairra Bardon is currently located at home and no one is currently with them during visit. The provider, Monia Pouch, FNP is located in their office at time of visit.  I discussed the limitations, risks, security and privacy concerns of performing an evaluation and management service by telephone and the availability of in person appointments. I also discussed with the patient that there may be a patient responsible charge related to this service. The patient expressed understanding and agreed to proceed.  Subjective:  Patient ID: Bethany Hall, female    DOB: 08-24-1991, 29 y.o.   MRN: [id number]  Attempted MyChart Video visit, could not hear pts audio, pt disconnected so telephone visit completed.   Chief Complaint:  Dysuria   HPI: Bethany Hall is a 29 y.o. female presenting on 12/21/2019 for Dysuria   Dysuria  This is a new problem. The current episode started in the past 7 days. The problem occurs every urination. The problem has been gradually worsening. The quality of the pain is described as aching and burning. The pain is at a severity of 5/10. The pain is moderate. There has been no fever. She is sexually active. There is no history of pyelonephritis. Associated symptoms include a discharge, frequency and urgency. Pertinent negatives include no chills, flank pain, hematuria,  hesitancy, nausea, possible pregnancy, sweats or vomiting. She has tried increased fluids for the symptoms. The treatment provided no relief.  Vaginal Discharge The patient's primary symptoms include genital itching and vaginal discharge. The patient's pertinent negatives include no genital lesions, genital odor, genital rash, missed menses, pelvic pain or vaginal bleeding. This is a new problem. The current episode started in the past 7 days. The problem occurs constantly. The problem has been gradually worsening. The pain is mild. The problem affects both sides. She is not pregnant. Associated symptoms include discolored urine, dysuria, frequency and urgency. Pertinent negatives include no abdominal pain, anorexia, back pain, chills, constipation, diarrhea, fever, flank pain, headaches, hematuria, joint pain, joint swelling, nausea, painful intercourse, rash, sore throat or vomiting. The vaginal discharge was white, watery and copious. There has been no bleeding. She has not been passing clots. She has not been passing tissue. She has tried nothing for the symptoms. She is sexually active. No, her partner does not have an STD.     Relevant past medical, surgical, family, and social history reviewed and updated as indicated.  Allergies and medications reviewed and updated.   Past Medical History:  Diagnosis Date  . Chronic pain    all over body per patient - no meds  . 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 GERD (gastroesophageal reflux disease)    occas tums prm  . Medical history non-contributory   . Neuromuscular disorder ([place])    carpel tunnel bilateral    Past Surgical History:  Procedure Laterality Date  . CESAREAN SECTION  2011,2014,2016,02/2017   x  4  . DILATION AND EVACUATION N/A 08/07/2017   Procedure: DILATATION AND EVACUATION WITH ULTRASOUND GUIDANCE ;  Surgeon: Shea Evans, MD;  Location: [facility];  Service: Gynecology;  Laterality: N/A;     Social History   Socioeconomic History  . Marital status: Married    Spouse name: Not on file  . Number of children: Not on file  . Years of education: Not on file  . Highest education level: Not on file  Occupational History  . Not on file  Tobacco Use  . Smoking status: Never Smoker  . Smokeless tobacco: Never Used  Substance and Sexual Activity  . Alcohol use: No    Comment: occ  . Drug use: No  . Sexual activity: Yes    Birth control/protection: Injection    Comment: approx 7-[redacted] wks gestation per patient  Other Topics Concern  . Not on file  Social History Narrative  . Not on file   Social Determinants of Health   Financial Resource Strain:   . Difficulty of Paying Living Expenses:   Food Insecurity:   . Worried About Programme researcher, broadcasting/film/video in the Last Year:   . Barista in the Last Year:   Transportation Needs:   . Freight forwarder (Medical):   Marland Kitchen Lack of Transportation (Non-Medical):   Physical Activity:   . Days of Exercise per Week:   . Minutes of Exercise per Session:   Stress:   . Feeling of Stress :   Social Connections:   . Frequency of Communication with Friends and Family:   . Frequency of Social Gatherings with Friends and Family:   . Attends Religious Services:   . Active Member of Clubs or Organizations:   . Attends Banker Meetings:   Marland Kitchen Marital Status:   Intimate Partner Violence:   . Fear of Current or Ex-Partner:   . Emotionally Abused:   Marland Kitchen Physically Abused:   . Sexually Abused:     Outpatient Encounter Medications as of 12/21/2019  Medication Sig  . cetirizine (ZYRTEC) 10 MG tablet Take 1 tablet (10 mg total) by mouth daily.  Marland Kitchen omeprazole (PRILOSEC) 20 MG capsule Take 1 capsule (20 mg total) by mouth 2 (two) times daily before a meal for 14 days, THEN 1 capsule (20 mg total) daily for 14 days.   No facility-administered encounter medications on file as of 12/21/2019.    Allergies  Allergen Reactions  .  Penicillins Rash    Has patient had a PCN reaction causing immediate rash, facial/tongue/throat swelling, SOB or lightheadedness with hypotension: Unknown Has patient had a PCN reaction causing severe rash involving mucus membranes or skin necrosis: Unknown Has patient had a PCN reaction that required hospitalization: Unknown Has patient had a PCN reaction occurring within the last 10 years: No If all of the above answers are "NO", then may proceed with Cephalosporin use. Infantile Reaction.    Review of Systems  Constitutional: Negative for chills and fever.  HENT: Negative for sore throat.   Gastrointestinal: Negative for abdominal pain, anorexia, constipation, diarrhea, nausea and vomiting.  Genitourinary: Positive for dysuria, frequency, urgency and vaginal discharge. Negative for flank pain, hematuria, hesitancy, missed menses and pelvic pain.  Musculoskeletal: Negative for back pain and joint pain.  Skin: Negative for rash.  Neurological: Negative for headaches.         Observations/Objective: No vital signs or physical exam, this was a telephone or virtual health encounter.  Pt alert and oriented,  answers all questions appropriately, and able to speak in full sentences.    Assessment and Plan: [place] was seen today for dysuria.  Diagnoses and all orders for this visit:  Dysuria Pt to provide urinalysis for evaluation. Treatment pending results.  -     Urinalysis, Complete; Future -     WET PREP FOR TRICH, YEAST, CLUE; Future -     Chlamydia/Gonococcus/Trichomonas, NAA; Future -     Urine Culture; Future  Vaginal discharge Vaginal itching Pt to come to office for below testing. Treatment pending results. Will check pregnancy test also.  -     WET PREP FOR TRICH, YEAST, CLUE; Future -     Chlamydia/Gonococcus/Trichomonas, NAA; Future     Follow Up Instructions: Return if symptoms worsen or fail to improve.    I discussed the assessment and treatment plan with  the patient. The patient was provided an opportunity to ask questions and all were answered. The patient agreed with the plan and demonstrated an understanding of the instructions.   The patient was advised to call back or seek an in-person evaluation if the symptoms worsen or if the condition fails to improve as anticipated.  The above assessment and management plan was discussed with the patient. The patient verbalized understanding of and has agreed to the management plan. Patient is aware to call the clinic if they develop any new symptoms or if symptoms persist or worsen. Patient is aware when to return to the clinic for a follow-up visit. Patient educated on when it is appropriate to go to the emergency department.    I provided 15 minutes of non-face-to-face time during this encounter. The call started at 1525. The call ended at 1540. The other time was used for coordination of care.    Kari Baars, FNP-C [facility] [address] [place], [place] [address] [phone] 12/21/2019

## 2019-12-22 ENCOUNTER — Other Ambulatory Visit: Payer: BC Managed Care – PPO

## 2019-12-22 ENCOUNTER — Other Ambulatory Visit: Payer: Self-pay

## 2019-12-22 DIAGNOSIS — N898 Other specified noninflammatory disorders of vagina: Secondary | ICD-10-CM

## 2019-12-22 DIAGNOSIS — R300 Dysuria: Secondary | ICD-10-CM

## 2019-12-22 LAB — URINALYSIS, COMPLETE
Bilirubin, UA: NEGATIVE
Glucose, UA: NEGATIVE
Ketones, UA: NEGATIVE
Leukocytes,UA: NEGATIVE
Nitrite, UA: NEGATIVE
Protein,UA: NEGATIVE
RBC, UA: NEGATIVE
Specific Gravity, UA: >1.03 — ABNORMAL HIGH (ref 1.005–1.030)
Urobilinogen, Ur: 0.2 mg/dL (ref 0.2–1.0)
pH, UA: 5.5 (ref 5.0–7.5)

## 2019-12-22 LAB — MICROSCOPIC EXAMINATION
Bacteria, UA: NONE SEEN
Epithelial Cells (non renal): >10 /hpf — AB (ref 0–10)
RBC, Urine: NONE SEEN /hpf (ref 0–2)
Renal Epithel, UA: NONE SEEN /hpf

## 2019-12-22 LAB — WET PREP FOR TRICH, YEAST, CLUE
Clue Cell Exam: NEGATIVE
Trichomonas Exam: NEGATIVE
Yeast Exam: NEGATIVE

## 2019-12-22 LAB — PREGNANCY, URINE: Preg Test, Ur: NEGATIVE

## 2019-12-23 LAB — URINE CULTURE

## 2019-12-24 LAB — CHLAMYDIA/GONOCOCCUS/TRICHOMONAS, NAA
Chlamydia by NAA: NEGATIVE
Gonococcus by NAA: NEGATIVE
Trich vag by NAA: NEGATIVE

## 2020-03-08 ENCOUNTER — Other Ambulatory Visit: Payer: Self-pay | Admitting: *Deleted

## 2020-03-08 ENCOUNTER — Telehealth: Payer: Self-pay | Admitting: Family Medicine

## 2020-03-08 MED ORDER — FLUCONAZOLE 150 MG PO TABS: 150.0000 mg | ORAL_TABLET | Freq: Once | ORAL | 0 refills | Status: AC

## 2020-03-08 NOTE — Telephone Encounter (Signed)
Wanting an STD check and a urine check because she has a new partner.  I advised that would probably require an office visit.

## 2020-03-08 NOTE — Telephone Encounter (Signed)
Can she have diflucan or do you wish to do a visit? Please advise

## 2020-03-08 NOTE — Telephone Encounter (Signed)
Patient has a follow up appointment scheduled. 

## 2020-03-08 NOTE — Telephone Encounter (Signed)
  Incoming Patient Call  03/08/2020  What symptoms do you have? Discomfort pain itching  How long have you been sick? 3 days ago  Have you been seen for this problem? no  If your provider decides to give you a prescription, which pharmacy would you like for it to be sent to? [facility]   Patient informed that this information will be sent to the clinical staff for review and that they should receive a follow up call.

## 2020-03-08 NOTE — Telephone Encounter (Signed)
Yes NOV

## 2020-03-08 NOTE — Telephone Encounter (Signed)
Ok to send x1.  Will NOV if ongoing issue.  Diflucan 150mg  PO once #1 no RF

## 2020-03-27 ENCOUNTER — Other Ambulatory Visit: Payer: Self-pay

## 2020-03-27 ENCOUNTER — Ambulatory Visit: Payer: BC Managed Care – PPO | Admitting: Family Medicine

## 2020-03-27 ENCOUNTER — Encounter: Payer: Self-pay | Admitting: Family Medicine

## 2020-03-27 ENCOUNTER — Other Ambulatory Visit (HOSPITAL_COMMUNITY)
Admission: RE | Admit: 2020-03-27 | Discharge: 2020-03-27 | Disposition: A | Discharge status: Home / Self Care | Payer: BC Managed Care – PPO | Source: Ambulatory Visit | Attending: Family Medicine | Admitting: Family Medicine

## 2020-03-27 VITALS — BP 106/69 | HR 97 | Temp 98.2°F | Ht 59.0 in | Wt 131.0 lb

## 2020-03-27 DIAGNOSIS — Z202 Contact with and (suspected) exposure to infections with a predominantly sexual mode of transmission: Secondary | ICD-10-CM | POA: Insufficient documentation

## 2020-03-27 DIAGNOSIS — B360 Pityriasis versicolor: Secondary | ICD-10-CM | POA: Diagnosis not present

## 2020-03-27 DIAGNOSIS — Z124 Encounter for screening for malignant neoplasm of cervix: Secondary | ICD-10-CM

## 2020-03-27 LAB — WET PREP FOR TRICH, YEAST, CLUE
Clue Cell Exam: NEGATIVE
Trichomonas Exam: NEGATIVE
Yeast Exam: NEGATIVE

## 2020-03-27 MED ORDER — KETOCONAZOLE 2 % EX SHAM: 1.0000 "application " | MEDICATED_SHAMPOO | CUTANEOUS | 0 refills | Status: DC

## 2020-03-27 NOTE — Progress Notes (Signed)
Subjective: CC: Possible exposure to STD PCP: Raliegh Ip, DO FGH:WEXHBZ Bethany Hall is a 29 y.o. female presenting to clinic today for:  1.  Possible exposure to STD Patient reports that she and her spouse have separated.  She has not been having any abnormal vaginal symptoms outside of her norm (where she typically has some itching and abnormal white discharge).  She has been spotting since she has been off the Depo.  No pelvic pain.  Does not report any sore throats or fevers.  2.  Rash Patient reports a rash that is itchy on her back.  This is been present for a while now.  She does note that she typically sweats on her back.  No therapies tried yet.   ROS: Per HPI  Allergies  Allergen Reactions   Penicillins Rash    Has patient had a PCN reaction causing immediate rash, facial/tongue/throat swelling, SOB or lightheadedness with hypotension: Unknown Has patient had a PCN reaction causing severe rash involving mucus membranes or skin necrosis: Unknown Has patient had a PCN reaction that required hospitalization: Unknown Has patient had a PCN reaction occurring within the last 10 years: No If all of the above answers are "NO", then may proceed with Cephalosporin use. Infantile Reaction.   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 Social History   Socioeconomic History   Marital status: Married    Spouse name: Not on file   Number of children: Not on file   Years of education: Not on file   Highest education level: Not on file  Occupational History   Not on file  Tobacco Use   Smoking status: Never Smoker   Smokeless tobacco: Never Used    Vaping Use   Vaping Use: Never used  Substance and Sexual Activity   Alcohol use: No    Comment: occ   Drug use: No   Sexual activity: Not Currently    Birth control/protection: None  Other Topics Concern   Not on file  Social History Narrative   Not on file   Social Determinants of Health   Financial Resource Strain:    Difficulty of Paying Living Expenses:   Food Insecurity:    Worried About Programme researcher, broadcasting/film/video in the Last Year:    Barista in the Last Year:   Transportation Needs:    Freight forwarder (Medical):    Lack of Transportation (Non-Medical):   Physical Activity:    Days of Exercise per Week:    Minutes of Exercise per Session:   Stress:    Feeling of Stress :   Social Connections:    Frequency of Communication with Friends and Family:    Frequency of Social Gatherings with Friends and Family:    Attends Religious Services:    Active Member of Clubs or Organizations:    Attends Engineer, structural:    Marital Status:   Intimate Partner Violence:    Fear of Current or Ex-Partner:    Emotionally Abused:    Physically Abused:    Sexually Abused:    Family  History  Problem Relation Age of Onset   Diabetes Mother    Heart disease Father     Objective: Office vital signs reviewed. BP 106/69    Pulse 97    Temp 98.2 F (36.8 C) (Temporal)    Ht 4\' 11"  (1.499 m)    Wt 131 lb (59.4 kg)    LMP 03/06/2020 (Exact Date)    SpO2 99%    BMI 26.46 kg/m   Physical Examination:  General: Awake, alert, well nourished, No acute distress GU: external vaginal tissue normal, cervix anterior/ midline, no punctate lesions on cervix appreciated, moderate brownish mucus discharge from cervical os, no cervical motion tenderness, no abdominal/ adnexal masses Skin; several patches of hyperpigmentation noted along the back.  Assessment/ Plan: 29 y.o. female   1. Possible exposure to STD Pap smear obtained and cotesting  added for GC, chlamydia, trichomonas.  We will obtain blood labs to evaluate for possible blood transmitted diseases - Cytology - PAP - STD Screen (8) - WET PREP FOR TRICH, YEAST, CLUE  2. Screening for malignant neoplasm of cervix - Cytology - PAP  3. Tinea versicolor Clinically consistent with tinea.  Ketoconazole prescribed.  Handout provided.  Follow-up as needed - ketoconazole (NIZORAL) 2 % shampoo; Apply 1 application topically 2 (two) times a week. x4-6 weeks  Dispense: 120 mL; Refill: 0    Orders Placed This Encounter  Procedures   WET PREP FOR [place], YEAST, CLUE   STD Screen (8)   No orders of the defined types were placed in this encounter.    Janora Norlander, DO [place] [phone]

## 2020-03-27 NOTE — Patient Instructions (Signed)
You had labs performed today.  You will be contacted with the results of the labs once they are available, usually in the next 3 business days for routine lab work.  If you have an active my chart account, they will be released to your MyChart.  If you prefer to have these labs released to you via telephone, please let us know.  If you had a pap smear or biopsy performed, expect to be contacted in about 7-10 days.   Tinea Versicolor  Tinea versicolor is a skin infection. It is caused by a type of yeast. It is normal for some yeast to be on your skin, but too much yeast causes this infection. The infection causes a rash of light or dark patches on your skin. The rash is most common on the chest, back, neck, or upper arms. The infection usually does not cause other problems. If it is treated, it will probably go away in a few weeks. The infection cannot be spread from one person to another (is not contagious). Follow these instructions at home:  Use over-the-counter and prescription medicines only as told by your doctor.  Scrub your skin every day with dandruff shampoo as told by your doctor.  Do not scratch your skin in the rash area.  Avoid places that are hot and humid.  Do not use tanning booths.  Try to avoid sweating a lot. Contact a doctor if:  Your symptoms get worse.  You have a fever.  You have redness, swelling, or pain in the rash area.  You have fluid or blood coming from your rash.  Your rash feels warm to the touch.  You have pus or a bad smell coming from your rash.  Your rash comes back (recurs) after treatment. Summary  Tinea versicolor is a skin infection. It causes a rash of light or dark patches on your skin.  The rash is most common on the chest, back, neck, or upper arms. This infection usually does not cause other problems.  Use over-the-counter and prescription medicines only as told by your doctor.  If the infection is treated, it will probably go  away in a few weeks. This information is not intended to replace advice given to you by your health care provider. Make sure you discuss any questions you have with your health care provider. Document Revised: 09/05/2017 Document Reviewed: 05/26/2017 Elsevier Patient Education  2020 [employer].

## 2020-03-28 LAB — STD SCREEN (8)
HIV Screen 4th Generation wRfx: NONREACTIVE
HSV 1 Glycoprotein G Ab, IgG: 26.9 index — ABNORMAL HIGH (ref 0.00–0.90)
HSV 2 IgG, Type Spec: <0.91 index (ref 0.00–0.90)
Hep A IgM: NEGATIVE
Hep B C IgM: NEGATIVE
Hep C Virus Ab: 0.1 s/co ratio (ref 0.0–0.9)
Hepatitis B Surface Ag: NEGATIVE
RPR Ser Ql: NONREACTIVE

## 2020-04-03 LAB — CYTOLOGY - PAP
Adequacy: ABSENT
Chlamydia: NEGATIVE
Comment: NEGATIVE
Comment: NEGATIVE
Comment: NEGATIVE
Comment: NEGATIVE
Comment: NORMAL
Diagnosis: NEGATIVE
HSV1: NEGATIVE
HSV2: NEGATIVE
High risk HPV: NEGATIVE
Neisseria Gonorrhea: NEGATIVE
Trichomonas: NEGATIVE

## 2020-04-14 ENCOUNTER — Encounter: Payer: Self-pay | Admitting: Emergency Medicine

## 2020-04-14 ENCOUNTER — Ambulatory Visit
Admission: EM | Admit: 2020-04-14 | Discharge: 2020-04-14 | Disposition: A | Discharge status: Home / Self Care | Payer: BC Managed Care – PPO | Attending: Emergency Medicine | Admitting: Emergency Medicine

## 2020-04-14 DIAGNOSIS — N898 Other specified noninflammatory disorders of vagina: Secondary | ICD-10-CM | POA: Diagnosis not present

## 2020-04-14 DIAGNOSIS — Z0000 Encounter for general adult medical examination without abnormal findings: Secondary | ICD-10-CM | POA: Diagnosis present

## 2020-04-14 LAB — POCT URINALYSIS DIP (MANUAL ENTRY)
Bilirubin, UA: NEGATIVE
Glucose, UA: NEGATIVE mg/dL
Ketones, POC UA: NEGATIVE mg/dL
Nitrite, UA: NEGATIVE
Protein Ur, POC: NEGATIVE mg/dL
Spec Grav, UA: >=1.03 — AB (ref 1.010–1.025)
Urobilinogen, UA: 0.2 E.U./dL
pH, UA: 5.5 (ref 5.0–8.0)

## 2020-04-14 LAB — POCT URINE PREGNANCY: Preg Test, Ur: NEGATIVE

## 2020-04-14 MED ORDER — FLUCONAZOLE 200 MG PO TABS
ORAL_TABLET | ORAL | 0 refills | Status: DC
Start: 2020-04-14 — End: 2020-05-22

## 2020-04-14 NOTE — Discharge Instructions (Addendum)
Vaginal self-swab obtained.  We will follow up with you regarding abnormal results HIV/ syphilis testing today Prescribed diflucan 200 mg once daily and then second dose 72 hours later Take medications as prescribed and to completion If tests results are positive, please abstain from sexual activity until you and your partner(s) have been treated Follow up with PCP Return here or go to ER if you have any new or worsening symptoms fever, chills, nausea, vomiting, abdominal or pelvic pain, painful intercourse, vaginal discharge, vaginal bleeding, persistent symptoms despite treatment, etc..Marland Kitchen

## 2020-04-14 NOTE — ED Provider Notes (Signed)
[facility] CARE CENTER   [id number] 04/14/20 Arrival Time: 1721   [id number] DISCHARGE  SUBJECTIVE:  Bethany Hall is a 29 y.o. female who presents with complaints of vaginal discharge, white and thick, and vaginal irritation x 3 days.  Admits to swimming recently.  Patient has been sexually active with 1 female partner over the past 6 months.  Last sex 2 weeks ago.  Partner without symptoms.  Worsening symptoms with itching.  She reports similar symptoms in the past and was diagnosed with yeast, treated with diflucan with relief.  She denies fever, chills, nausea, vomiting, abdominal or pelvic pain, urinary symptoms, vaginal odor, vaginal bleeding, dyspareunia, vaginal rashes or lesions.   No LMP recorded. Patient has had an injection.  ROS: As per HPI.  All other pertinent ROS negative.     Past Medical History:  Diagnosis Date  . Chronic pain    all over body per patient - no meds  . 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 GERD (gastroesophageal reflux disease)    occas tums prm  . Medical history non-contributory   . Neuromuscular disorder (HCC)    carpel tunnel bilateral   Past Surgical History:  Procedure Laterality Date  . CESAREAN SECTION  2011,2014,2016,02/2017   x 4  . DILATION AND EVACUATION N/A 08/07/2017   Procedure: DILATATION AND EVACUATION WITH ULTRASOUND GUIDANCE ;  Surgeon: Shea Evans, MD;  Location: [facility];  Service: Gynecology;  Laterality: N/A;   Allergies  Allergen Reactions  . Penicillins Rash    Has patient had a PCN reaction causing immediate rash, facial/tongue/throat swelling, SOB or lightheadedness with hypotension: Unknown Has patient had a PCN reaction causing severe rash involving mucus membranes or skin necrosis: Unknown Has patient had a PCN reaction that required hospitalization: Unknown Has patient had a PCN reaction occurring within the last 10 years: No If all of the above answers are "NO", then may proceed  with Cephalosporin use. Infantile Reaction.   No current facility-administered medications on file prior to encounter.   Current Outpatient Medications on File Prior to Encounter  Medication Sig Dispense Refill  . ketoconazole (NIZORAL) 2 % shampoo Apply 1 application topically 2 (two) times a week. x4-6 weeks 120 mL 0    Social History   Socioeconomic History  . Marital status: Married    Spouse name: Not on file  . Number of children: Not on file  . Years of education: Not on file  . Highest education level: Not on file  Occupational History  . Not on file  Tobacco Use  . Smoking status: Never Smoker  . Smokeless tobacco: Never Used  Vaping Use  . Vaping Use: Never used  Substance and Sexual Activity  . Alcohol use: No    Comment: occ  . Drug use: No  . Sexual activity: Not Currently    Birth control/protection: None  Other Topics Concern  . Not on file  Social History Narrative  . Not on file   Social Determinants of Health   Financial Resource Strain:   . Difficulty of Paying Living Expenses:   Food Insecurity:   . Worried About Programme researcher, broadcasting/film/video in the Last Year:   . Barista in the Last Year:   Transportation Needs:   . Freight forwarder (Medical):   Marland Kitchen Lack of Transportation (Non-Medical):   Physical Activity:   . Days of Exercise per Week:   . Minutes of  Exercise per Session:   Stress:   . Feeling of Stress :   Social Connections:   . Frequency of Communication with Friends and Family:   . Frequency of Social Gatherings with Friends and Family:   . Attends Religious Services:   . Active Member of Clubs or Organizations:   . Attends Banker Meetings:   Marland Kitchen Marital Status:   Intimate Partner Violence:   . Fear of Current or Ex-Partner:   . Emotionally Abused:   Marland Kitchen Physically Abused:   . Sexually Abused:    Family History  Problem Relation Age of Onset  . Diabetes Mother   . Heart disease Father     OBJECTIVE:  Vitals:    04/14/20 1731 04/14/20 1741  BP: 116/80   Pulse: 81   Resp: 17   Temp: 98.7 F (37.1 C)   TempSrc: Oral   SpO2: 100%   Weight:  130 lb 1.1 oz (59 kg)  Height:  4\' 11"  (1.499 m)     General appearance: Alert, NAD, appears stated age Head: NCAT Throat: lips, mucosa, and tongue normal; teeth and gums normal Lungs: CTA bilaterally without adventitious breath sounds Heart: regular rate and rhythm.   Abdomen: soft, non-tender; bowel sounds normal; no guarding or rebound tenderness GU: deferred Skin: warm and dry Psychological:  Alert and cooperative. Normal mood and affect.  LABS:  Results for orders placed or performed during the hospital encounter of 04/14/20  POCT urine pregnancy  Result Value Ref Range   Preg Test, Ur Negative Negative  POCT urinalysis dipstick  Result Value Ref Range   Color, UA yellow yellow   Clarity, UA clear clear   Glucose, UA negative negative mg/dL   Bilirubin, UA negative negative   Ketones, POC UA negative negative mg/dL   Spec Grav, UA 06/15/20 (A) 1.010 - 1.025   Blood, UA trace-intact (A) negative   pH, UA 5.5 5.0 - 8.0   Protein Ur, POC negative negative mg/dL   Urobilinogen, UA 0.2 0.2 or 1.0 E.U./dL   Nitrite, UA Negative Negative   Leukocytes, UA Trace (A) Negative    ASSESSMENT & PLAN:  1. Routine general medical examination at a health care facility   2. Vaginal discharge   3. Vaginal irritation     Meds ordered this encounter  Medications  . fluconazole (DIFLUCAN) 200 MG tablet    Sig: Take one dose by mouth, wait 72 hours, and then take second dose by mouth    Dispense:  2 tablet    Refill:  0    Order Specific Question:   Supervising Provider    Answer:   >=7.026 Eustace Moore   Vaginal self-swab obtained.  We will follow up with you regarding abnormal results HIV/ syphilis testing today Prescribed diflucan 200 mg once daily and then second dose 72 hours later Take medications as prescribed and to  completion If tests results are positive, please abstain from sexual activity until you and your partner(s) have been treated Follow up with PCP Return here or go to ER if you have any new or worsening symptoms fever, chills, nausea, vomiting, abdominal or pelvic pain, painful intercourse, vaginal discharge, vaginal bleeding, persistent symptoms despite treatment, etc...   Reviewed expectations re: course of current medical issues. Questions answered. Outlined signs and symptoms indicating need for more acute intervention. Patient verbalized understanding. After Visit Summary given.       [[id number]], PA-C 04/14/20 1814

## 2020-04-14 NOTE — ED Triage Notes (Signed)
Vaginal burning/ itching. Would like std test

## 2020-04-15 LAB — URINE CULTURE
Culture: NO GROWTH
Special Requests: NORMAL

## 2020-04-15 LAB — RPR: RPR Ser Ql: NONREACTIVE

## 2020-04-15 LAB — HIV ANTIBODY (ROUTINE TESTING W REFLEX): HIV Screen 4th Generation wRfx: NONREACTIVE

## 2020-04-17 LAB — CERVICOVAGINAL ANCILLARY ONLY
Bacterial Vaginitis (gardnerella): NEGATIVE
Candida Glabrata: NEGATIVE
Candida Vaginitis: POSITIVE — AB
Chlamydia: NEGATIVE
Comment: NEGATIVE
Comment: NEGATIVE
Comment: NEGATIVE
Comment: NEGATIVE
Comment: NEGATIVE
Comment: NORMAL
Neisseria Gonorrhea: NEGATIVE
Trichomonas: NEGATIVE

## 2020-05-02 ENCOUNTER — Ambulatory Visit (INDEPENDENT_AMBULATORY_CARE_PROVIDER_SITE_OTHER): Payer: BC Managed Care – PPO | Admitting: Family Medicine

## 2020-05-02 DIAGNOSIS — K649 Unspecified hemorrhoids: Secondary | ICD-10-CM | POA: Diagnosis not present

## 2020-05-02 DIAGNOSIS — B359 Dermatophytosis, unspecified: Secondary | ICD-10-CM

## 2020-05-02 MED ORDER — PROCTOFOAM HC 1-1 % EX FOAM: 1.0000 | Freq: Two times a day (BID) | CUTANEOUS | 1 refills | Status: DC

## 2020-05-02 MED ORDER — CLOTRIMAZOLE 1 % EX SOLN: 1.0000 "application " | Freq: Two times a day (BID) | CUTANEOUS | 2 refills | Status: DC

## 2020-05-02 NOTE — Patient Instructions (Signed)
Hemorrhoids Hemorrhoids are swollen veins that may develop:  In the butt (rectum). These are called internal hemorrhoids.  Around the opening of the butt (anus). These are called external hemorrhoids. Hemorrhoids can cause pain, itching, or bleeding. Most of the time, they do not cause serious problems. They usually get better with diet changes, lifestyle changes, and other home treatments. What are the causes? This condition may be caused by:  Having trouble pooping (constipation).  Pushing hard (straining) to poop.  Watery poop (diarrhea).  Pregnancy.  Being very overweight (obese).  Sitting for long periods of time.  Heavy lifting or other activity that causes you to strain.  Anal sex.  Riding a bike for a long period of time. What are the signs or symptoms? Symptoms of this condition include:  Pain.  Itching or soreness in the butt.  Bleeding from the butt.  Leaking poop.  Swelling in the area.  One or more lumps around the opening of your butt. How is this diagnosed? A doctor can often diagnose this condition by looking at the affected area. The doctor may also:  Do an exam that involves feeling the area with a gloved hand (digital rectal exam).  Examine the area inside your butt using a small tube (anoscope).  Order blood tests. This may be done if you have lost a lot of blood.  Have you get a test that involves looking inside the colon using a flexible tube with a camera on the end (sigmoidoscopy or colonoscopy). How is this treated? This condition can usually be treated at home. Your doctor may tell you to change what you eat, make lifestyle changes, or try home treatments. If these do not help, procedures can be done to remove the hemorrhoids or make them smaller. These may involve:  Placing rubber bands at the base of the hemorrhoids to cut off their blood supply.  Injecting medicine into the hemorrhoids to shrink them.  Shining a type of light  energy onto the hemorrhoids to cause them to fall off.  Doing surgery to remove the hemorrhoids or cut off their blood supply. Follow these instructions at home: Eating and drinking   Eat foods that have a lot of fiber in them. These include whole grains, beans, nuts, fruits, and vegetables.  Ask your doctor about taking products that have added fiber (fibersupplements).  Reduce the amount of fat in your diet. You can do this by: ? Eating low-fat dairy products. ? Eating less red meat. ? Avoiding processed foods.  Drink enough fluid to keep your pee (urine) pale yellow. Managing pain and swelling   Take a warm-water bath (sitz bath) for 20 minutes to ease pain. Do this 3-4 times a day. You may do this in a bathtub or using a portable sitz bath that fits over the toilet.  If told, put ice on the painful area. It may be helpful to use ice between your warm baths. ? Put ice in a plastic bag. ? Place a towel between your skin and the bag. ? Leave the ice on for 20 minutes, 2-3 times a day. General instructions  Take over-the-counter and prescription medicines only as told by your doctor. ? Medicated creams and medicines may be used as told.  Exercise often. Ask your doctor how much and what kind of exercise is best for you.  Go to the bathroom when you have the urge to poop. Do not wait.  Avoid pushing too hard when you poop.  Keep your   butt dry and clean. Use wet toilet paper or moist towelettes after pooping.  Do not sit on the toilet for a long time.  Keep all follow-up visits as told by your doctor. This is important. Contact a doctor if you:  Have pain and swelling that do not get better with treatment or medicine.  Have trouble pooping.  Cannot poop.  Have pain or swelling outside the area of the hemorrhoids. Get help right away if you have:  Bleeding that will not stop. Summary  Hemorrhoids are swollen veins in the butt or around the opening of the  butt.  They can cause pain, itching, or bleeding.  Eat foods that have a lot of fiber in them. These include whole grains, beans, nuts, fruits, and vegetables.  Take a warm-water bath (sitz bath) for 20 minutes to ease pain. Do this 3-4 times a day. This information is not intended to replace advice given to you by your health care provider. Make sure you discuss any questions you have with your health care provider. Document Revised: 10/01/2018 Document Reviewed: 02/12/2018 Elsevier Patient Education  2020 Elsevier Inc.  

## 2020-05-02 NOTE — Progress Notes (Signed)
Telephone visit  Subjective: CC: hemorrhoid PCP: Raliegh Ip, DO Bethany Hall is a 29 y.o. female calls for telephone consult today. Patient provides verbal consent for consult held via phone.  Due to COVID-19 pandemic this visit was conducted virtually. This visit type was conducted due to national recommendations for restrictions regarding the COVID-19 Pandemic (e.g. social distancing, sheltering in place) in an effort to limit this patient's exposure and mitigate transmission in our community. All issues noted in this document were discussed and addressed.  A physical exam was not performed with this format.   Location of patient: home Location of provider: [facility] Others present for call: none  1. Bleeding hemorrhoid Patient reports a 5-day history of a bleeding hemorrhoid.  She is been using OTC Preparation H, epsom salt baths.  She reports that symptoms do not seem to be getting better.  She is wondering if there something else she can do for treatment.  2.  Athletes feet Patient reports peeling, itchy skin on her feet.  She has been using several over-the-counter products but unfortunately not have relieved symptoms.  She is wondering if we can switch to a prescription  ROS: Per HPI  Allergies  Allergen Reactions  . Penicillins Rash    Has patient had a PCN reaction causing immediate rash, facial/tongue/throat swelling, SOB or lightheadedness with hypotension: Unknown Has patient had a PCN reaction causing severe rash involving mucus membranes or skin necrosis: Unknown Has patient had a PCN reaction that required hospitalization: Unknown Has patient had a PCN reaction occurring within the last 10 years: No If all of the above answers are "NO", then may proceed with Cephalosporin use. Infantile Reaction.   Past Medical History:  Diagnosis Date  . Chronic pain    all over body per patient - no meds  . 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 GERD (gastroesophageal reflux disease)    occas tums prm  . Medical history non-contributory   . Neuromuscular disorder (HCC)    carpel tunnel bilateral    Current Outpatient Medications:  .  fluconazole (DIFLUCAN) 200 MG tablet, Take one dose by mouth, wait 72 hours, and then take second dose by mouth, Disp: 2 tablet, Rfl: 0 .  ketoconazole (NIZORAL) 2 % shampoo, Apply 1 application topically 2 (two) times a week. x4-6 weeks, Disp: 120 mL, Rfl: 0  Assessment/ Plan: 29 y.o. female   1. Bleeding hemorrhoid Continue sits baths.  Proctofoam for up to 1 week.  Return precautions reviewed.  Follow up prn - hydrocortisone-pramoxine (PROCTOFOAM HC) rectal foam; Place 1 applicator rectally 2 (two) times daily. x7 day per flare.  Dispense: 10 g; Refill: 1  2. Tinea Clotrimazole applied bid to affected area.  Keep area dry - clotrimazole (LOTRIMIN) 1 % external solution; Apply 1 application topically 2 (two) times daily. To feet for 4-6 weeks.  Dispense: 60 mL; Refill: 2   Start time: 1:24pm End time: 1:28pm  Total time spent on patient care (including telephone call/ virtual visit): 10 minutes  Shanitha Twining Hulen Skains, DO [facility] [phone]

## 2020-05-22 ENCOUNTER — Ambulatory Visit
Admission: EM | Admit: 2020-05-22 | Discharge: 2020-05-22 | Disposition: A | Discharge status: Home / Self Care | Payer: BC Managed Care – PPO

## 2020-05-22 ENCOUNTER — Other Ambulatory Visit: Payer: Self-pay

## 2020-05-22 DIAGNOSIS — R519 Headache, unspecified: Secondary | ICD-10-CM

## 2020-05-22 DIAGNOSIS — H9202 Otalgia, left ear: Secondary | ICD-10-CM

## 2020-05-22 DIAGNOSIS — J029 Acute pharyngitis, unspecified: Secondary | ICD-10-CM

## 2020-05-22 NOTE — ED Triage Notes (Signed)
Pt presents with c/o left ear pain that began a couple days ago

## 2020-05-22 NOTE — Discharge Instructions (Signed)
Declines COVID test Get plenty of rest and push fluids Symptoms may be secondary to sinus congestion Use OTC zyrtec for nasal congestion, runny nose, and/or sore throat Use OTC flonase for nasal congestion and runny nose Use medications daily for symptom relief Use OTC medications like ibuprofen or tylenol as needed fever or pain Call or go to the ED if you have any new or worsening symptoms such as fever, cough, shortness of breath, chest tightness, chest pain, turning blue, changes in mental status, etc..Marland Kitchen

## 2020-05-22 NOTE — ED Provider Notes (Signed)
[facility] CARE CENTER   [id number] 05/22/20 Arrival Time: 1731   CC: COVID symptoms  SUBJECTIVE: History from: patient.  Bethany Hall is a 29 y.o. female who presents with LT ear pain, sinus headache, and scratchy throat x 3 days.  Denies sick exposure to COVID, flu or strep.  Denies wearing ear plugs or swimming.  Denies alleviating or aggravating factors.  Denies fever, chills, fatigue, sinus pain, rhinorrhea, sore throat, SOB, wheezing, chest pain, nausea, changes in bowel or bladder habits.     ROS: As per HPI.  All other pertinent ROS negative.     Past Medical History:  Diagnosis Date  . Chronic pain    all over body per patient - no meds  . 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 GERD (gastroesophageal reflux disease)    occas tums prm  . Medical history non-contributory   . Neuromuscular disorder (HCC)    carpel tunnel bilateral   Past Surgical History:  Procedure Laterality Date  . CESAREAN SECTION  2011,2014,2016,02/2017   x 4  . DILATION AND EVACUATION N/A 08/07/2017   Procedure: DILATATION AND EVACUATION WITH ULTRASOUND GUIDANCE ;  Surgeon: Shea Evans, MD;  Location: [facility];  Service: Gynecology;  Laterality: N/A;   Allergies  Allergen Reactions  . Penicillins Rash    Has patient had a PCN reaction causing immediate rash, facial/tongue/throat swelling, SOB or lightheadedness with hypotension: Unknown Has patient had a PCN reaction causing severe rash involving mucus membranes or skin necrosis: Unknown Has patient had a PCN reaction that required hospitalization: Unknown Has patient had a PCN reaction occurring within the last 10 years: No If all of the above answers are "NO", then may proceed with Cephalosporin use. Infantile Reaction.   No current facility-administered medications on file prior to encounter.   Current Outpatient Medications on File Prior to Encounter  Medication Sig Dispense Refill  . clotrimazole  (LOTRIMIN) 1 % external solution Apply 1 application topically 2 (two) times daily. To feet for 4-6 weeks. 60 mL 2  . hydrocortisone-pramoxine (PROCTOFOAM HC) rectal foam Place 1 applicator rectally 2 (two) times daily. x7 day per flare. 10 g 1  . ketoconazole (NIZORAL) 2 % shampoo Apply 1 application topically 2 (two) times a week. x4-6 weeks 120 mL 0   Social History   Socioeconomic History  . Marital status: Married    Spouse name: Not on file  . Number of children: Not on file  . Years of education: Not on file  . Highest education level: Not on file  Occupational History  . Not on file  Tobacco Use  . Smoking status: Never Smoker  . Smokeless tobacco: Never Used  Vaping Use  . Vaping Use: Never used  Substance and Sexual Activity  . Alcohol use: No    Comment: occ  . Drug use: No  . Sexual activity: Not Currently    Birth control/protection: None  Other Topics Concern  . Not on file  Social History Narrative  . Not on file   Social Determinants of Health   Financial Resource Strain:   . Difficulty of Paying Living Expenses:   Food Insecurity:   . Worried About Programme researcher, broadcasting/film/video in the Last Year:   . Barista in the Last Year:   Transportation Needs:   . Freight forwarder (Medical):   Marland Kitchen Lack of Transportation (Non-Medical):   Physical Activity:   . Days of  Exercise per Week:   . Minutes of Exercise per Session:   Stress:   . Feeling of Stress :   Social Connections:   . Frequency of Communication with Friends and Family:   . Frequency of Social Gatherings with Friends and Family:   . Attends Religious Services:   . Active Member of Clubs or Organizations:   . Attends Banker Meetings:   Marland Kitchen Marital Status:   Intimate Partner Violence:   . Fear of Current or Ex-Partner:   . Emotionally Abused:   Marland Kitchen Physically Abused:   . Sexually Abused:    Family History  Problem Relation Age of Onset  . Diabetes Mother   . Heart disease Father      OBJECTIVE:  Vitals:   05/22/20 1742  BP: 109/76  Pulse: 82  Resp: 18  Temp: 98 F (36.7 C)  SpO2: 99%     General appearance: alert; well-appearing nontoxic; speaking in full sentences and tolerating own secretions HEENT: NCAT; Ears: EACs clear, TMs pearly gray; Eyes: PERRL.  EOM grossly intact. Nose: nares patent without rhinorrhea, Throat: oropharynx clear, tonsils non erythematous or enlarged, uvula midline  Neck: supple without LAD Lungs: unlabored respirations, symmetrical air entry; cough: absent; no respiratory distress; CTAB Heart: regular rate and rhythm.   Skin: warm and dry Psychological: alert and cooperative; normal mood and affect ASSESSMENT & PLAN:  1. Left ear pain   2. Sinus headache   3. Sore throat     Declines COVID test Get plenty of rest and push fluids Symptoms may be secondary to sinus congestion Use OTC zyrtec for nasal congestion, runny nose, and/or sore throat Use OTC flonase for nasal congestion and runny nose Use medications daily for symptom relief Use OTC medications like ibuprofen or tylenol as needed fever or pain Call or go to the ED if you have any new or worsening symptoms such as fever, cough, shortness of breath, chest tightness, chest pain, turning blue, changes in mental status, etc...   Reviewed expectations re: course of current medical issues. Questions answered. Outlined signs and symptoms indicating need for more acute intervention. Patient verbalized understanding. After Visit Summary given.         Rennis Harding, PA-C 05/22/20 1809

## 2020-07-20 ENCOUNTER — Ambulatory Visit (INDEPENDENT_AMBULATORY_CARE_PROVIDER_SITE_OTHER): Payer: BC Managed Care – PPO | Admitting: Family Medicine

## 2020-07-20 DIAGNOSIS — B360 Pityriasis versicolor: Secondary | ICD-10-CM

## 2020-07-20 DIAGNOSIS — B359 Dermatophytosis, unspecified: Secondary | ICD-10-CM

## 2020-07-20 MED ORDER — CLOTRIMAZOLE 1 % EX SOLN: 1.0000 "application " | Freq: Two times a day (BID) | CUTANEOUS | 2 refills | Status: DC

## 2020-07-20 MED ORDER — KETOCONAZOLE 2 % EX SHAM: 1.0000 "application " | MEDICATED_SHAMPOO | CUTANEOUS | 2 refills | Status: DC

## 2020-07-20 NOTE — Progress Notes (Signed)
Telephone visit  Subjective: CC: rash PCP: Raliegh Ip, DO Bethany Hall is a 29 y.o. female calls for telephone consult today. Patient provides verbal consent for consult held via phone.  Due to COVID-19 pandemic this visit was conducted virtually. This visit type was conducted due to national recommendations for restrictions regarding the COVID-19 Pandemic (e.g. social distancing, sheltering in place) in an effort to limit this patient's exposure and mitigate transmission in our community. All issues noted in this document were discussed and addressed.  A physical exam was not performed with this format.   Location of patient: home Location of provider: [facility] Others present for call: none  1. Rash Recurrent rash on her back that she describes diffusely itchy.  She does admit to sweating a lot and is trying to use products from [facility] to help with this.  She did respond to the initial treatment in June.  She would like a refill on this medication.  Additionally, she would like a refill on the ciclopirox, which was prescribed for her athlete's foot.   ROS: Per HPI  Allergies  Allergen Reactions  . Penicillins Rash    Has patient had a PCN reaction causing immediate rash, facial/tongue/throat swelling, SOB or lightheadedness with hypotension: Unknown Has patient had a PCN reaction causing severe rash involving mucus membranes or skin necrosis: Unknown Has patient had a PCN reaction that required hospitalization: Unknown Has patient had a PCN reaction occurring within the last 10 years: No If all of the above answers are "NO", then may proceed with Cephalosporin use. Infantile Reaction.   Past Medical History:  Diagnosis Date  . Chronic pain    all over body per patient - no meds  . 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 GERD (gastroesophageal reflux disease)    occas tums prm  . Medical history non-contributory   . Neuromuscular disorder  (HCC)    carpel tunnel bilateral    Current Outpatient Medications:  .  clotrimazole (LOTRIMIN) 1 % external solution, Apply 1 application topically 2 (two) times daily. To feet for 4-6 weeks., Disp: 60 mL, Rfl: 2 .  hydrocortisone-pramoxine (PROCTOFOAM HC) rectal foam, Place 1 applicator rectally 2 (two) times daily. x7 day per flare., Disp: 10 g, Rfl: 1 .  ketoconazole (NIZORAL) 2 % shampoo, Apply 1 application topically 2 (two) times a week. x4-6 weeks, Disp: 120 mL, Rfl: 0  Assessment/ Plan: 29 y.o. female   1. Tinea versicolor Recurrent.  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  We discussed that symptoms are refractory to repeat treatment, plan for referral to dermatology.  Patient in agreement - ketoconazole (NIZORAL) 2 % shampoo; Apply 1 application topically 2 (two) times a week. x4-6 weeks  Dispense: 120 mL; Refill: 2  2. Tinea - clotrimazole (LOTRIMIN) 1 % external solution; Apply 1 application topically 2 (two) times daily. To feet for 4-6 weeks.  Dispense: 60 mL; Refill: 2   Start time: 9:38am End time: 9:42a  Total time spent on patient care (including telephone call/ virtual visit): 10 minutes  Gavon Majano Hulen Skains, DO [facility] [phone]

## 2020-07-20 NOTE — Patient Instructions (Signed)
Tinea Versicolor  Tinea versicolor is a skin infection. It is caused by a type of yeast. It is normal for some yeast to be on your skin, but too much yeast causes this infection. The infection causes a rash of light or dark patches on your skin. The rash is most common on the chest, back, neck, or upper arms. The infection usually does not cause other problems. If it is treated, it will probably go away in a few weeks. The infection cannot be spread from one person to another (is not contagious). Follow these instructions at home:  Use over-the-counter and prescription medicines only as told by your doctor.  Scrub your skin every day with dandruff shampoo as told by your doctor.  Do not scratch your skin in the rash area.  Avoid places that are hot and humid.  Do not use tanning booths.  Try to avoid sweating a lot. Contact a doctor if:  Your symptoms get worse.  You have a fever.  You have redness, swelling, or pain in the rash area.  You have fluid or blood coming from your rash.  Your rash feels warm to the touch.  You have pus or a bad smell coming from your rash.  Your rash comes back (recurs) after treatment. Summary  Tinea versicolor is a skin infection. It causes a rash of light or dark patches on your skin.  The rash is most common on the chest, back, neck, or upper arms. This infection usually does not cause other problems.  Use over-the-counter and prescription medicines only as told by your doctor.  If the infection is treated, it will probably go away in a few weeks. This information is not intended to replace advice given to you by your health care provider. Make sure you discuss any questions you have with your health care provider. Document Revised: 09/05/2017 Document Reviewed: 05/26/2017 Elsevier Patient Education  2020 Elsevier Inc.  

## 2020-07-27 ENCOUNTER — Encounter: Payer: Self-pay | Admitting: Nurse Practitioner

## 2020-07-27 ENCOUNTER — Ambulatory Visit (INDEPENDENT_AMBULATORY_CARE_PROVIDER_SITE_OTHER): Payer: BC Managed Care – PPO | Admitting: Nurse Practitioner

## 2020-07-27 DIAGNOSIS — R059 Cough, unspecified: Secondary | ICD-10-CM | POA: Diagnosis not present

## 2020-07-27 DIAGNOSIS — J029 Acute pharyngitis, unspecified: Secondary | ICD-10-CM

## 2020-07-27 DIAGNOSIS — R0981 Nasal congestion: Secondary | ICD-10-CM

## 2020-07-27 DIAGNOSIS — Z20822 Contact with and (suspected) exposure to covid-19: Secondary | ICD-10-CM | POA: Diagnosis not present

## 2020-07-27 DIAGNOSIS — Z7189 Other specified counseling: Secondary | ICD-10-CM

## 2020-07-27 NOTE — Addendum Note (Signed)
Addended by: Margorie John on: 07/27/2020 02:14 PM   Modules accepted: Orders

## 2020-07-27 NOTE — Progress Notes (Signed)
   Virtual Visit via telephone Note Due to COVID-19 pandemic this visit was conducted virtually. This visit type was conducted due to national recommendations for restrictions regarding the COVID-19 Pandemic (e.g. social distancing, sheltering in place) in an effort to limit this patient's exposure and mitigate transmission in our community. All issues noted in this document were discussed and addressed.  A physical exam was not performed with this format.  I connected with Bethany Hall on 07/27/20 at 9:55 by telephone and verified that I am speaking with the correct person using two identifiers. Bethany Hall is currently located at home and no one is currently with her during visit. The provider, Mary-Margaret Daphine Deutscher, FNP is located in their office at time of visit.  I discussed the limitations, risks, security and privacy concerns of performing an evaluation and management service by telephone and the availability of in person appointments. I also discussed with the patient that there may be a patient responsible charge related to this service. The patient expressed understanding and agreed to proceed.   History and Present Illness:   Chief Complaint: URI   HPI patient scheduled for video visit. She states that for a week she has had a headache and congestion. Developed a sore throat yesterday and is worse today. She denies fever but is having chills. 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 She denies having covid vaccine.   Review of Systems  Constitutional: Positive for chills. Fever: ?  HENT: Positive for congestion and sore throat.   Respiratory: Positive for cough.   Neurological: Positive for headaches.  All other systems reviewed and are negative.    Observations/Objective: Alert and oriented- answers all questions appropriately No distress Voice is hoarse  Assessment and Plan: Bethany Hall in today with chief complaint of  URI   1. Sore throat Strep test pending Force fluids Motrin or tylenol OTC OTC decongestant Throat lozenges if help New toothbrush in 3 days  - Rapid Strep Screen ([facility] ONLY); Future - Novel Coronavirus, NAA ([facility]); Future  2. Cough Delsym OTC  3. Nasal congestion  4. Suspected COVID-19 virus infection covid test pneding  covid education given       I discussed the assessment and treatment plan with the patient. The patient was provided an opportunity to ask questions and all were answered. The patient agreed with the plan and demonstrated an understanding of the instructions.   The patient was advised to call back or seek an in-person evaluation if the symptoms worsen or if the condition fails to improve as anticipated.  The above assessment and management plan was discussed with the patient. The patient verbalized understanding of and has agreed to the management plan. Patient is aware to call the clinic if symptoms persist or worsen. Patient is aware when to return to the clinic for a follow-up visit. Patient educated on when it is appropriate to go to the emergency department.   Time call ended:  10:17  I provided 22 minutes of non-face-to-face time during this encounter.    Mary-Margaret Daphine Deutscher, FNP

## 2020-07-27 NOTE — Addendum Note (Signed)
Addended by: Prescott Gum on: 07/27/2020 01:05 PM   Modules accepted: Orders

## 2020-07-29 LAB — NOVEL CORONAVIRUS, NAA: SARS-CoV-2, NAA: NOT DETECTED

## 2020-07-29 LAB — SARS-COV-2, NAA 2 DAY TAT

## 2020-07-31 LAB — CULTURE, GROUP A STREP

## 2020-07-31 LAB — RAPID STREP SCREEN (MED CTR MEBANE ONLY): Strep Gp A Ag, IA W/Reflex: NEGATIVE

## 2020-08-30 ENCOUNTER — Ambulatory Visit: Payer: BC Managed Care – PPO | Admitting: Family Medicine

## 2020-08-30 ENCOUNTER — Other Ambulatory Visit: Payer: Self-pay

## 2020-08-30 ENCOUNTER — Encounter: Payer: Self-pay | Admitting: Family Medicine

## 2020-08-30 VITALS — BP 86/52 | HR 80 | Temp 98.3°F | Ht 59.0 in | Wt 128.8 lb

## 2020-08-30 DIAGNOSIS — R21 Rash and other nonspecific skin eruption: Secondary | ICD-10-CM

## 2020-08-30 DIAGNOSIS — J3489 Other specified disorders of nose and nasal sinuses: Secondary | ICD-10-CM | POA: Diagnosis not present

## 2020-08-30 DIAGNOSIS — R0982 Postnasal drip: Secondary | ICD-10-CM | POA: Diagnosis not present

## 2020-08-30 DIAGNOSIS — Z630 Problems in relationship with spouse or partner: Secondary | ICD-10-CM | POA: Diagnosis not present

## 2020-08-30 DIAGNOSIS — G44229 Chronic tension-type headache, not intractable: Secondary | ICD-10-CM

## 2020-08-30 NOTE — Progress Notes (Signed)
Subjective: CC: Rash PCP: Raliegh Ip, DO Bethany Hall is a 29 y.o. female presenting to clinic today for:  1.  Rash Patient with ongoing rash along her back and shoulders. This is now refractory to treatment for tinea, acne.   She also has a rash on her toes that are refractory to antifungals.  She would like to proceed with a referral to dermatology  2.  Stress at home Patient with ongoing marital stress.  She is separated from her spouse and there are no plans for reconciliation at this time.  She would like to start counseling services with potentially a family counselor as she feels that her son is also being affected by the stressors.  Asking for recommendations today.  She reports headaches as well.  No reports of falls or neurologic abnormalities  3.  Sinus drainage Her child is sick currently.  She is been having some ear pressure.  She reports postnasal drip and mild sore throat.  No fevers, myalgias.  She is a Pension scheme manager.  Not currently on any oral antihistamines   ROS: Per HPI  Allergies  Allergen Reactions   Penicillins Rash    Has patient had a PCN reaction causing immediate rash, facial/tongue/throat swelling, SOB or lightheadedness with hypotension: Unknown Has patient had a PCN reaction causing severe rash involving mucus membranes or skin necrosis: Unknown Has patient had a PCN reaction that required hospitalization: Unknown Has patient had a PCN reaction occurring within the last 10 years: No If all of the above answers are "NO", then may proceed with Cephalosporin use. Infantile Reaction.   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 To feet for 4-6 weeks., Disp: 60 mL, Rfl: 2   hydrocortisone-pramoxine (PROCTOFOAM HC) rectal foam, Place 1 applicator rectally 2 (two) times daily. x7 day per flare. (Patient not taking: Reported on 08/30/2020), Disp: 10 g, Rfl: 1   ketoconazole (NIZORAL) 2 % shampoo, Apply 1 application topically 2 (two) times a week. x4-6 weeks, Disp: 120 mL, Rfl: 2 Social History   Socioeconomic History   Marital status: Married    Spouse name: Not on file   Number of children: Not on file   Years of education: Not on file   Highest education level: Not on file  Occupational History   Not on file  Tobacco Use   Smoking status: Never Smoker   Smokeless tobacco: Never Used  Vaping Use   Vaping Use: Never used  Substance and Sexual Activity   Alcohol use: No    Comment: occ   Drug use: No   Sexual activity: Not Currently    Birth control/protection: None  Other Topics Concern   Not on file  Social History Narrative   Not on file   Social Determinants of Health   Financial Resource Strain:    Difficulty of Paying Living Expenses: Not on file  Food Insecurity:    Worried About Running Out of Food in the Last Year: Not on file   Ran  Out of Food in the Last Year: Not on file  Transportation Needs:    Lack of Transportation (Medical): Not on file   Lack of Transportation (Non-Medical): Not on file  Physical Activity:    Days of Exercise per Week: Not on file   Minutes of Exercise per Session: Not on file  Stress:    Feeling of Stress : Not on file  Social Connections:    Frequency of Communication with Friends and Family: Not on file   Frequency of Social Gatherings with Friends and Family: Not on file   Attends Religious Services: Not on file   Active Member of Clubs or Organizations: Not on file   Attends Banker  Meetings: Not on file   Marital Status: Not on file  Intimate Partner Violence:    Fear of Current or Ex-Partner: Not on file   Emotionally Abused: Not on file   Physically Abused: Not on file   Sexually Abused: Not on file   Family History  Problem Relation Age of Onset   Diabetes Mother    Heart disease Father     Objective: Office vital signs reviewed. BP (!) 86/52    Pulse 80    Temp 98.3 F (36.8 C)    Ht 4\' 11"  (1.499 m)    Wt 128 lb 12.8 oz (58.4 kg)    LMP 08/16/2020    SpO2 99%    BMI 26.01 kg/m   Physical Examination:  General: Awake, alert, well nourished, No acute distress HEENT: Normal; sclera white.  TMs intact bilaterally.  Normal light reflex.  She has mild erythema of the nasal turbinates with clear nasal discharge.  She has oral pharyngeal erythema that is mild and appears to be more consistent with a cobblestone appearance.  No tonsillar exudates or enlargement Cardio: regular rate and rhythm, S1S2 heard, no murmurs appreciated Pulm: clear to auscultation bilaterally, no wheezes, rhonchi or rales; normal work of breathing on room air Skin: She has what appears to be both open and closed comedones with associated hyperpigmentation along the back and shoulders.  There is previous scarring noted. Psych: Mood is stable.  Patient is pleasant and interactive.  She has got good eye contact.  Assessment/ Plan: 29 y.o. female   Rash and nonspecific skin eruption - Plan: Ambulatory referral to Dermatology  Rash of foot - Plan: Ambulatory referral to Dermatology  Marital stress  Rhinorrhea  Post-nasal drainage  Chronic tension-type headache, not intractable  Uncertain etiology.  She has had symptoms refractory to OTC acne medications, prescription acne washes and also treatment for tinea. Referral to dermatology has been placed  I have given her information for irenic therapy in [place].  She is considering family/couples counseling.  She will let me  know if this is a good fit and/or if she needs a referral  Symptoms are likely allergic in nature versus viral.  No evidence of bacterial infectious etiology at this time.  I have recommended oral antihistamine to help with postnasal drainage and this should subsequently help with sore throat.  I question if her headaches are tension in nature and likely secondary to the stressors as mentioned above.  If symptoms persist, we could consider trial of something like a tricyclic antidepressant.  This would help with both depressive symptoms and act as a preventative for headaches.  Alternatively, could consider mirtazapine which is also been shown helpful with headaches.   No orders of the defined types were placed in this encounter.  No orders of the defined types were placed in this encounter.    Bethany Norlander, DO [place] [phone]

## 2020-08-30 NOTE — Patient Instructions (Signed)
Claritin, Allegra (nondrowsy) or Zyrtec (drowsy) for drainage   - Get plenty of rest and drink plenty of fluids. - Try to breathe moist air. Use a cold mist humidifier. - Consume warm fluids (soup or tea) to provide relief for a stuffy nose and to loosen phlegm. - For nasal stuffiness, try saline nasal spray or a Neti Pot. Afrin nasal spray can also be used but this product should not be used longer than 3 days or it will cause rebound nasal stuffiness (worsening nasal congestion). - For sore throat pain relief: use chloraseptic spray, suck on throat lozenges, hard candy or popsicles; gargle with warm salt water (1/4 tsp. salt per 8 oz. of water); and eat soft, bland foods. - Eat a well-balanced diet. If you cannot, ensure you are getting enough nutrients by taking a daily multivitamin. - Avoid dairy products, as they can thicken phlegm. - Avoid alcohol, as it impairs your bodys immune system.  CONTACT YOUR DOCTOR IF YOU EXPERIENCE ANY OF THE FOLLOWING: - High fever - Ear pain - Sinus-type headache - Unusually severe cold symptoms - Cough that gets worse while other cold symptoms improve - Flare up of any chronic lung problem, such as asthma - Your symptoms persist longer than 2 weeks

## 2020-09-12 ENCOUNTER — Other Ambulatory Visit: Payer: Self-pay

## 2020-09-12 ENCOUNTER — Ambulatory Visit
Admission: EM | Admit: 2020-09-12 | Discharge: 2020-09-12 | Disposition: A | Discharge status: Home / Self Care | Payer: BC Managed Care – PPO | Attending: Internal Medicine | Admitting: Internal Medicine

## 2020-09-12 DIAGNOSIS — B349 Viral infection, unspecified: Secondary | ICD-10-CM

## 2020-09-12 DIAGNOSIS — Z1152 Encounter for screening for COVID-19: Secondary | ICD-10-CM

## 2020-09-12 MED ORDER — ALBUTEROL SULFATE HFA 108 (90 BASE) MCG/ACT IN AERS: 1.0000 | INHALATION_SPRAY | Freq: Four times a day (QID) | RESPIRATORY_TRACT | 0 refills | Status: DC | PRN

## 2020-09-12 MED ORDER — IBUPROFEN 600 MG PO TABS
600.0000 mg | ORAL_TABLET | Freq: Four times a day (QID) | ORAL | 0 refills | Status: DC | PRN
Start: 2020-09-12 — End: 2020-11-28

## 2020-09-12 MED ORDER — BENZONATATE 100 MG PO CAPS
100.0000 mg | ORAL_CAPSULE | Freq: Three times a day (TID) | ORAL | 0 refills | Status: DC
Start: 2020-09-12 — End: 2020-11-28

## 2020-09-12 NOTE — ED Provider Notes (Signed)
[facility]    CSN: [id number] Arrival date & time: 09/12/20  1333      History   Chief Complaint Chief Complaint  Patient presents with  . Cough    HPI Bethany Hall is a 29 y.o. female comes to the urgent care with 2-day history of generalized body aches, headaches, cough, sore throat and nasal congestion.  Symptoms started 2 days ago and has been persistent.  Patient has had some nausea but no vomiting.  She had one episode of diarrhea.  Diarrhea was nonmucoid, nonbloody.  No sick contacts.  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  She is not vaccinated against COVID-19 virus.  No dizziness, near syncope or syncopal episodes.   HPI  Past Medical History:  Diagnosis Date  . Chronic pain    all over body per patient - no meds  . 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 GERD (gastroesophageal reflux disease)    occas tums prm  . Medical history non-contributory   . Neuromuscular disorder (HCC)    carpel tunnel bilateral    Patient Active Problem List   Diagnosis Date Noted  . Degenerative disc disease, cervical 10/20/2018  . Chronic bilateral thoracic back pain 10/20/2018  . Musculoskeletal pain of right upper extremity 10/20/2018  . Chronically dry eyes, bilateral 10/20/2018  . Dry mouth 10/20/2018    Past Surgical History:  Procedure Laterality Date  . CESAREAN SECTION  2011,2014,2016,02/2017   x 4  . DILATION AND EVACUATION N/A 08/07/2017   Procedure: DILATATION AND EVACUATION WITH ULTRASOUND GUIDANCE ;  Surgeon: Shea Evans, MD;  Location: [facility];  Service: Gynecology;  Laterality: N/A;    OB History    Gravida  5   Para  4   Term  3   Preterm  1   AB  0   Living  4     SAB  0   TAB  0   Ectopic  0   Multiple  0   Live Births  4            Home Medications    Prior to Admission medications   Medication Sig Start Date End Date Taking? Authorizing Provider  albuterol  (VENTOLIN HFA) 108 (90 Base) MCG/ACT inhaler Inhale 1-2 puffs into the lungs every 6 (six) hours as needed for wheezing or shortness of breath. 09/12/20   Pavielle Biggar, Britta Mccreedy, MD  benzonatate (TESSALON) 100 MG capsule Take 1 capsule (100 mg total) by mouth every 8 (eight) hours. 09/12/20   Bronsen Serano, Britta Mccreedy, MD  clotrimazole (LOTRIMIN) 1 % external solution Apply 1 application topically 2 (two) times daily. To feet for 4-6 weeks. 07/20/20   Raliegh Ip, DO  ibuprofen (ADVIL) 600 MG tablet Take 1 tablet (600 mg total) by mouth every 6 (six) hours as needed. 09/12/20   Lyda Colcord, Britta Mccreedy, MD  ketoconazole (NIZORAL) 2 % shampoo Apply 1 application topically 2 (two) times a week. x4-6 weeks 07/20/20   Raliegh Ip, DO    Family History Family History  Problem Relation Age of Onset  . Diabetes Mother   . Heart disease Father     Social History Social History   Tobacco Use  . Smoking status: Never Smoker  . Smokeless tobacco: Never Used  Vaping Use  . Vaping Use: Never used  Substance Use Topics  . Alcohol use: No    Comment: occ  .  Drug use: No     Allergies   Penicillins   Review of Systems Review of Systems  Constitutional: Negative.   HENT: Positive for congestion and sore throat. Negative for rhinorrhea.   Eyes: Negative.   Respiratory: Positive for cough. Negative for chest tightness, shortness of breath and wheezing.   Gastrointestinal: Positive for diarrhea. Negative for abdominal pain.  Genitourinary: Negative.   Musculoskeletal: Positive for myalgias.  Skin: Negative.   Neurological: Positive for headaches.     Physical Exam Triage Vital Signs ED Triage Vitals [09/12/20 1423]  Enc Vitals Group     BP 121/79     Pulse Rate 74     Resp 20     Temp 98.1 F (36.7 C)     Temp src      SpO2 99 %     Weight      Height      Head Circumference      Peak Flow      Pain Score      Pain Loc      Pain Edu?      Excl. in GC?    No data found.   Updated Vital Signs BP 121/79   Pulse 74   Temp 98.1 F (36.7 C)   Resp 20   LMP 08/16/2020   SpO2 99%   Visual Acuity Right Eye Distance:   Left Eye Distance:   Bilateral Distance:    Right Eye Near:   Left Eye Near:    Bilateral Near:     Physical Exam Vitals and nursing note reviewed.  Constitutional:      General: She is not in acute distress.    Appearance: She is ill-appearing. She is not toxic-appearing.  HENT:     Right Ear: Tympanic membrane normal.     Left Ear: Tympanic membrane normal.     Mouth/Throat:     Pharynx: Posterior oropharyngeal erythema present.  Cardiovascular:     Rate and Rhythm: Normal rate and regular rhythm.     Pulses: Normal pulses.     Heart sounds: Normal heart sounds.  Pulmonary:     Effort: Pulmonary effort is normal. No respiratory distress.     Breath sounds: Normal breath sounds. No wheezing or rhonchi.  Neurological:     General: No focal deficit present.     Mental Status: She is alert.      UC Treatments / Results  Labs (all labs ordered are listed, but only abnormal results are displayed) Labs Reviewed  COVID-19, FLU A+B AND RSV    EKG   Radiology No results found.  Procedures Procedures (including critical care time)  Medications Ordered in UC Medications - No data to display  Initial Impression / Assessment and Plan / UC Course  I have reviewed the triage vital signs and the nursing notes.  Pertinent labs & imaging results that were available during my care of the patient were reviewed by me and considered in my medical decision making (see chart for details).     1.  Acute viral syndrome: COVID-19/influenza PCR test sent Tylenol/Motrin as needed for pain and/or fever Tessalon Perles as needed for cough Albuterol inhaler as needed for wheezing Patient is advised to quarantine until COVID-19 test results are available Return precautions given. Final Clinical Impressions(s) / UC Diagnoses   Final  diagnoses:  Acute viral syndrome     Discharge Instructions     Please push oral fluids Tylenol/Motrin as needed for body  aches Please quarantine until COVID-19 test results are available If symptoms worsen please return to the urgent care to be reevaluated.   ED Prescriptions    Medication Sig Dispense Auth. Provider   ibuprofen (ADVIL) 600 MG tablet Take 1 tablet (600 mg total) by mouth every 6 (six) hours as needed. 30 tablet Jennifermarie Franzen, Britta Mccreedy, MD   benzonatate (TESSALON) 100 MG capsule Take 1 capsule (100 mg total) by mouth every 8 (eight) hours. 21 capsule Jermaine Neuharth, Britta Mccreedy, MD   albuterol (VENTOLIN HFA) 108 (90 Base) MCG/ACT inhaler Inhale 1-2 puffs into the lungs every 6 (six) hours as needed for wheezing or shortness of breath. 18 g Webber Michiels, Britta Mccreedy, MD     PDMP not reviewed this encounter.   Merrilee Jansky, MD 09/12/20 1622

## 2020-09-12 NOTE — Discharge Instructions (Signed)
Please push oral fluids Tylenol/Motrin as needed for body aches Please quarantine until COVID-19 test results are available If symptoms worsen please return to the urgent care to be reevaluated.

## 2020-09-14 ENCOUNTER — Telehealth: Payer: Self-pay

## 2020-09-14 LAB — COVID-19, FLU A+B AND RSV
Influenza A, NAA: DETECTED — AB
Influenza B, NAA: NOT DETECTED
RSV, NAA: NOT DETECTED
SARS-CoV-2, NAA: DETECTED — AB

## 2020-09-14 NOTE — Telephone Encounter (Signed)
lmtcb

## 2020-09-19 NOTE — Telephone Encounter (Signed)
Lmtcb  No call back - this encounter will be closed  

## 2020-09-21 ENCOUNTER — Encounter: Payer: Self-pay | Admitting: Family Medicine

## 2020-09-21 ENCOUNTER — Ambulatory Visit (INDEPENDENT_AMBULATORY_CARE_PROVIDER_SITE_OTHER): Payer: BC Managed Care – PPO | Admitting: Family Medicine

## 2020-09-21 ENCOUNTER — Ambulatory Visit: Payer: BC Managed Care – PPO

## 2020-09-21 DIAGNOSIS — J111 Influenza due to unidentified influenza virus with other respiratory manifestations: Secondary | ICD-10-CM

## 2020-09-21 DIAGNOSIS — U071 COVID-19: Secondary | ICD-10-CM | POA: Diagnosis not present

## 2020-09-21 MED ORDER — ALBUTEROL SULFATE HFA 108 (90 BASE) MCG/ACT IN AERS: 1.0000 | INHALATION_SPRAY | Freq: Four times a day (QID) | RESPIRATORY_TRACT | 0 refills | Status: DC | PRN

## 2020-09-21 MED ORDER — DEXAMETHASONE 2 MG PO TABS: ORAL_TABLET | ORAL | 0 refills | Status: DC

## 2020-09-21 NOTE — Progress Notes (Signed)
Virtual Visit via telephone Note  I connected with Bethany Hall on 09/21/20 at 1258 by telephone and verified that I am speaking with the correct person using two identifiers. Bethany Hall is currently located at home and patient are currently with her during visit. The provider, Elige Radon Syenna Nazir, MD is located in their office at time of visit.  Call ended at 1310  I discussed the limitations, risks, security and privacy concerns of performing an evaluation and management service by telephone and the availability of in person appointments. I also discussed with the patient that there may be a patient responsible charge related to this service. The patient expressed understanding and agreed to proceed.   History and Present Illness: Patient tested positive for covid and flu.  She gets out of breath with walking.  She has a lot of cough. She was diagnosed on the 7th of this month.  She was given nasal spray and ibuprofen, they are helping a little bit. She has a little body aches but no fever. She did not get tamiflu. She is a [employer] manager. She denies ever having asthma. She gets short of breath after shower and walking.  She does catch her breath with a lot of talking.  She denies wheezing.  She has a dry cough. She is trying to keep fluids and hydration.  She has good urine output.  She did have some diarrhea yesterday.  She did not have covid or flu vaccine.   No diagnosis found.  Outpatient Encounter Medications as of 09/21/2020  Medication Sig  . albuterol (VENTOLIN HFA) 108 (90 Base) MCG/ACT inhaler Inhale 1-2 puffs into the lungs every 6 (six) hours as needed for wheezing or shortness of breath.  . benzonatate (TESSALON) 100 MG capsule Take 1 capsule (100 mg total) by mouth every 8 (eight) hours.  . clotrimazole (LOTRIMIN) 1 % external solution Apply 1 application topically 2 (two) times daily. To feet for 4-6 weeks.  Marland Kitchen ibuprofen (ADVIL) 600 MG tablet Take 1 tablet  (600 mg total) by mouth every 6 (six) hours as needed.  Marland Kitchen ketoconazole (NIZORAL) 2 % shampoo Apply 1 application topically 2 (two) times a week. x4-6 weeks   No facility-administered encounter medications on file as of 09/21/2020.    Review of Systems  Constitutional: Negative for chills and fever.  HENT: Positive for congestion, postnasal drip, rhinorrhea, sinus pressure, sneezing and sore throat. Negative for ear discharge and ear pain.   Eyes: Negative for pain, redness and visual disturbance.  Respiratory: Positive for cough and shortness of breath. Negative for chest tightness and wheezing.   Cardiovascular: Negative for chest pain and leg swelling.  Genitourinary: Negative for difficulty urinating and dysuria.  Musculoskeletal: Negative for back pain and gait problem.  Skin: Negative for rash.  Neurological: Negative for light-headedness and headaches.  Psychiatric/Behavioral: Negative for agitation and behavioral problems.  All other systems reviewed and are negative.   Observations/Objective: Patient sounds comfortable and in no acute distress  Assessment and Plan: Problem List Items Addressed This Visit   None   Visit Diagnoses    COVID-19 virus infection    -  Primary   Relevant Medications   albuterol (VENTOLIN HFA) 108 (90 Base) MCG/ACT inhaler   dexamethasone (DECADRON) 2 MG tablet   Influenza       Relevant Medications   albuterol (VENTOLIN HFA) 108 (90 Base) MCG/ACT inhaler   dexamethasone (DECADRON) 2 MG tablet      Will treat with albuterol  and dexamethasone and see if that improves, if it does not we may need to get her to come in and do an x-ray in the future. Follow up plan: Return if symptoms worsen or fail to improve.     I discussed the assessment and treatment plan with the patient. The patient was provided an opportunity to ask questions and all were answered. The patient agreed with the plan and demonstrated an understanding of the instructions.    The patient was advised to call back or seek an in-person evaluation if the symptoms worsen or if the condition fails to improve as anticipated.  The above assessment and management plan was discussed with the patient. The patient verbalized understanding of and has agreed to the management plan. Patient is aware to call the clinic if symptoms persist or worsen. Patient is aware when to return to the clinic for a follow-up visit. Patient educated on when it is appropriate to go to the emergency department.    I provided 12 minutes of non-face-to-face time during this encounter.    Nils Pyle, MD

## 2020-09-26 ENCOUNTER — Ambulatory Visit: Payer: BC Managed Care – PPO | Admitting: Family Medicine

## 2020-09-27 ENCOUNTER — Other Ambulatory Visit: Payer: Self-pay | Admitting: Family Medicine

## 2020-09-27 ENCOUNTER — Telehealth: Payer: Self-pay

## 2020-09-27 DIAGNOSIS — U071 COVID-19: Secondary | ICD-10-CM

## 2020-09-27 DIAGNOSIS — R0602 Shortness of breath: Secondary | ICD-10-CM

## 2020-09-27 NOTE — Telephone Encounter (Signed)
I can order it for [facility] or [facility] if she would like one. Let me know where

## 2020-09-27 NOTE — Telephone Encounter (Signed)
  Incoming Patient Call  09/27/2020  What symptoms do you have? Still having shortness of breath and when she walks she gets fatigue and still coughing. Had televisit 12-16 with Dettinger and he told her if she didn't get any better to come in for chest xray. Don't know if she needs another appt with provider but wants the xray.  How long have you been sick? 3 weeks  Have you been seen for this problem? YES 12-16 with Dettinger  If your provider decides to give you a prescription, which pharmacy would you like for it to be sent to? [facility]  Patient informed that this information will be sent to the clinical staff for review and that they should receive a follow up call.

## 2020-09-27 NOTE — Telephone Encounter (Signed)
I will order. I do not think she needs an appt but she may want to call ahead and see what they wait looks like.

## 2020-09-27 NOTE — Telephone Encounter (Signed)
Pt aware.

## 2020-09-27 NOTE — Telephone Encounter (Signed)
LMTCB

## 2020-09-27 NOTE — Telephone Encounter (Signed)
Pt does want CXR at [facility], does she just go or do they call her?

## 2020-10-18 ENCOUNTER — Ambulatory Visit: Payer: BC Managed Care – PPO

## 2020-10-25 ENCOUNTER — Ambulatory Visit: Payer: BC Managed Care – PPO

## 2020-11-28 ENCOUNTER — Ambulatory Visit: Payer: BC Managed Care – PPO | Admitting: Family Medicine

## 2020-11-28 ENCOUNTER — Other Ambulatory Visit: Payer: Self-pay

## 2020-11-28 VITALS — BP 105/75 | HR 73 | Temp 98.3°F | Ht 59.0 in | Wt 132.0 lb

## 2020-11-28 DIAGNOSIS — I951 Orthostatic hypotension: Secondary | ICD-10-CM

## 2020-11-28 DIAGNOSIS — L819 Disorder of pigmentation, unspecified: Secondary | ICD-10-CM

## 2020-11-28 MED ORDER — CLOTRIMAZOLE-BETAMETHASONE 1-0.05 % EX CREA: 1.0000 "application " | TOPICAL_CREAM | Freq: Two times a day (BID) | CUTANEOUS | 0 refills | Status: DC

## 2020-11-28 NOTE — Progress Notes (Signed)
Subjective: [id number] PCP: Janora Norlander, DO Bethany Hall is a 30 y.o. female presenting to clinic today for:  Patient has been having a yellow discoloration of her skin on the right palm and left interdigital space.  Initially, she thought that this might be marker that gotten on her but she was not able to eradicate the discoloration.  She does report intermittent dizziness as well when she changes positions.  She has noticed that her blood pressure has run low on her fitness watch.  Has not observed any tachycardia.  She does have chronic fatigability.  She thought that perhaps the lesions on her hands or fungal.   ROS: Per HPI  Allergies  Allergen Reactions  . Penicillins Rash    Has patient had a PCN reaction causing immediate rash, facial/tongue/throat swelling, SOB or lightheadedness with hypotension: Unknown Has patient had a PCN reaction causing severe rash involving mucus membranes or skin necrosis: Unknown Has patient had a PCN reaction that required hospitalization: Unknown Has patient had a PCN reaction occurring within the last 10 years: No If all of the above answers are "NO", then may proceed with Cephalosporin use. Infantile Reaction.   Past Medical History:  Diagnosis Date  . Chronic pain    all over body per patient - no meds  . 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 GERD (gastroesophageal reflux disease)    occas tums prm  . Medical history non-contributory   . Neuromuscular disorder ([place])    carpel tunnel bilateral   No current outpatient medications on file. Social History   Socioeconomic History  . Marital status: Married    Spouse name: Not on file  . Number of children: Not on file  . Years of education: Not on file  . Highest education level: Not on file  Occupational History  . Not on file  Tobacco Use  . Smoking status: Never Smoker  . Smokeless tobacco: Never Used  Vaping Use  . Vaping  Use: Never used  Substance and Sexual Activity  . Alcohol use: No    Comment: occ  . Drug use: No  . Sexual activity: Not Currently    Birth control/protection: None  Other Topics Concern  . Not on file  Social History Narrative  . Not on file   Social Determinants of Health   Financial Resource Strain: Not on file  Food Insecurity: Not on file  Transportation Needs: Not on file  Physical Activity: Not on file  Stress: Not on file  Social Connections: Not on file  Intimate Partner Violence: Not on file   Family History  Problem Relation Age of Onset  . Diabetes Mother   . Heart disease Father     Objective: Office vital signs reviewed. BP 105/75   Pulse 73   Temp 98.3 F (36.8 C) (Temporal)   Ht 4' 11"  (1.499 m)   Wt 132 lb (59.9 kg)   SpO2 99%   BMI 26.66 kg/m   Physical Examination:  General: Awake, alert, well nourished, No acute distress HEENT: Normal; sclera white. Cardio: regular rate  Pulm: Normal work of breathing on room air Skin: Hyperpigmentation that is yellow-orange in color noted in the palmar crease of the right hand and superior aspect of the left palm.   Assessment/ Plan: 30 y.o. female   Hyperpigmentation - Plan: Cortisol-am, blood, CMP14+EGFR  Orthostasis - Plan: Cortisol-am, blood, WVP71+GGYI  Uncertain as to what is causing  the hyperpigmentation.  Quite unusual.  Given the constellation of symptoms including history of fatigability, orthostasis will evaluate for possible Addison's disease.  She will come in for an early morning lab draw on this Friday.  We will make a further plan pending this result.  In the meantime I did send a prescription for cream to have on hand should she need it.  Keep appointment with dermatology for now.  Orders Placed This Encounter  Procedures  . Cortisol-am, blood    Standing Status:   Future    Standing Expiration Date:   11/28/2021  . CMP14+EGFR    Standing Status:   Future    Standing Expiration  Date:   11/28/2021   No orders of the defined types were placed in this encounter.    Janora Norlander, DO [place] [phone]

## 2020-11-28 NOTE — Patient Instructions (Signed)
I'd like you to come in for early morning labs  I want to evaluate you for something called Adrenal insufficiency (this can present as low blood pressure and hyperpigmentation of the palms).   Addison's Disease  Addison's disease, which is also called primary adrenal insufficiency, is a condition in which the adrenal glands do not make enough of the hormones cortisol and aldosterone. The two adrenal glands are located above each kidney. The disease causes blood pressure to drop and causes potassium to build up to dangerous levels. If Addison's disease is not treated, it can suddenly get worse and become life-threatening. This is called an adrenal crisis(addisonian crisis). What are the causes? This condition may be caused by:  A disease in which the body's immune system damages the adrenal glands.  An infection of the adrenal glands.  Bleeding (hemorrhage) in the adrenal glands.  A tumor. What are the signs or symptoms? Common symptoms of this condition include:  Severe fatigue.  Muscle weakness.  Loss of appetite.  Weight loss.  Darkening of the skin.  Low blood pressure. Other symptoms include:  Nausea or vomiting.  Diarrhea.  Dizziness or fainting.  Irritability.  Depression.  Salt cravings.  Low blood sugar.  Irregular or no menstrual periods. Symptoms usually develop slowly and get worse gradually. How is this diagnosed? This condition may be diagnosed based on your:  Medical history.  Symptoms.  Lab test results. Lab tests include a measurement of your blood cortisol levels (ACTH stimulation test).  Imaging test results. You may have a CT scan of the adrenal glands. How is this treated? This condition cannot be cured, but it can be managed with medicines that replace cortisol and aldosterone. You may need to take these medicines:  Several times a day, by mouth.  Through an injection if you become so sick that you are unable to take these  medicines by mouth or you are unable to keep them down. Illness, stress, and surgery can increase your body's need for cortisol. It is very important that you talk with your health care provider and understand how to adjust your medicine dosages if you become ill or stressed or if you are going to have surgery. Follow these instructions at home: Medicines  Take over-the-counter and prescription medicines only as told by your health care provider.  Know how to increase your medicine dosage during periods of stress, mild illness, or surgery. General instructions  When you travel, carry a needle, a syringe, and an injectable form of cortisol in case of an emergency.  In case of an emergency, wear a medical alert bracelet or neck chain so that others understand that you have Addison's disease.  Carry an ID card that states that you have Addison's disease. The card should include: ? Instructions to inject a certain amount of medicine if you are severely hurt or cannot respond. ? The name and phone number of your health care provider. ? The name and phone number of your closest relative.  Keep all follow-up visits. This is important.   Contact a health care provider if:  You get sick with another illness.  You develop new symptoms. Get help right away if:  You have a severe infection or other illness.  You have severe vomiting or diarrhea.  You find it necessary to give yourself injectable medicine.  You have symptoms of an addisoniancrisis. These symptoms include: ? Sudden, severe pain in the lower back, abdomen, or legs. ? Severe vomiting and diarrhea. ?  Dehydration. ? Low blood pressure. ? Loss of consciousness. Summary  Addison's disease is the inability of the adrenal glands to make hormones that regulate everyday functions of the body.  If untreated, this condition can lead to life-threatening problems.  It is very important that you talk with your health care provider  and understand how to adjust your medicine dosages if you become ill or stressed or if you are going to have surgery.  Take over-the-counter and prescription medicines only as told by your health care provider.  Keep all follow-up visits. This is important. This information is not intended to replace advice given to you by your health care provider. Make sure you discuss any questions you have with your health care provider. Document Revised: 06/05/2020 Document Reviewed: 06/05/2020 Elsevier Patient Education  2021 [employer].

## 2020-11-29 ENCOUNTER — Other Ambulatory Visit: Payer: BC Managed Care – PPO

## 2020-11-29 DIAGNOSIS — I951 Orthostatic hypotension: Secondary | ICD-10-CM

## 2020-11-29 DIAGNOSIS — L819 Disorder of pigmentation, unspecified: Secondary | ICD-10-CM

## 2020-11-30 LAB — CMP14+EGFR
ALT: 12 IU/L (ref 0–32)
AST: 16 IU/L (ref 0–40)
Albumin/Globulin Ratio: 1.4 (ref 1.2–2.2)
Albumin: 4.2 g/dL (ref 3.8–4.8)
Alkaline Phosphatase: 61 IU/L (ref 44–121)
BUN/Creatinine Ratio: 16 (ref 9–23)
BUN: 10 mg/dL (ref 6–20)
Bilirubin Total: 0.3 mg/dL (ref 0.0–1.2)
CO2: 19 mmol/L — ABNORMAL LOW (ref 20–29)
Calcium: 10 mg/dL (ref 8.7–10.2)
Chloride: 104 mmol/L (ref 96–106)
Creatinine, Ser: 0.63 mg/dL (ref 0.57–1.00)
GFR calc Af Amer: 137 mL/min/{1.73_m2} (ref >59)
GFR calc non Af Amer: 119 mL/min/{1.73_m2} (ref >59)
Globulin, Total: 2.9 g/dL (ref 1.5–4.5)
Glucose: 92 mg/dL (ref 65–99)
Potassium: 4.2 mmol/L (ref 3.5–5.2)
Sodium: 137 mmol/L (ref 134–144)
Total Protein: 7.1 g/dL (ref 6.0–8.5)

## 2020-11-30 LAB — CORTISOL-AM, BLOOD: Cortisol - AM: 7 ug/dL (ref 6.2–19.4)

## 2020-12-05 ENCOUNTER — Telehealth: Payer: Self-pay

## 2020-12-05 DIAGNOSIS — I951 Orthostatic hypotension: Secondary | ICD-10-CM

## 2020-12-05 DIAGNOSIS — L819 Disorder of pigmentation, unspecified: Secondary | ICD-10-CM

## 2020-12-05 NOTE — Telephone Encounter (Signed)
Pt called to let Dr Nadine Counts know that she does want her to place referral for pt to see Endo.   Please call patient with info on where referral will be sent. Pt says if she doesn't answer, to leave message. [phone]

## 2020-12-06 ENCOUNTER — Ambulatory Visit
Admission: EM | Admit: 2020-12-06 | Discharge: 2020-12-06 | Disposition: A | Discharge status: Home / Self Care | Payer: BC Managed Care – PPO | Attending: Emergency Medicine | Admitting: Emergency Medicine

## 2020-12-06 ENCOUNTER — Other Ambulatory Visit: Payer: Self-pay

## 2020-12-06 ENCOUNTER — Encounter: Payer: Self-pay | Admitting: Emergency Medicine

## 2020-12-06 DIAGNOSIS — Z20822 Contact with and (suspected) exposure to covid-19: Secondary | ICD-10-CM

## 2020-12-06 DIAGNOSIS — Z1152 Encounter for screening for COVID-19: Secondary | ICD-10-CM | POA: Insufficient documentation

## 2020-12-06 DIAGNOSIS — J029 Acute pharyngitis, unspecified: Secondary | ICD-10-CM | POA: Diagnosis present

## 2020-12-06 LAB — POCT RAPID STREP A (OFFICE): Rapid Strep A Screen: NEGATIVE

## 2020-12-06 MED ORDER — LIDOCAINE VISCOUS HCL 2 % MT SOLN
15.0000 mL | Freq: Four times a day (QID) | OROMUCOSAL | 0 refills | Status: DC | PRN
Start: 2020-12-06 — End: 2021-01-12

## 2020-12-06 MED ORDER — FLUTICASONE PROPIONATE 50 MCG/ACT NA SUSP
1.0000 | Freq: Every day | NASAL | 0 refills | Status: DC
Start: 2020-12-06 — End: 2021-01-12

## 2020-12-06 MED ORDER — CETIRIZINE HCL 10 MG PO TABS: 10.0000 mg | ORAL_TABLET | Freq: Every day | ORAL | 0 refills | Status: DC

## 2020-12-06 NOTE — Telephone Encounter (Signed)
Patient calling to confirm that we will set up the appointment for Endocrinology or wondering if she needs to call.  I advised patient you will place referral and their office will contact her to schedule an appointment.

## 2020-12-06 NOTE — Discharge Instructions (Addendum)
COVID-19 testing ordered.  It will take 2 to 7 days for results to return.  Someone will call if your result is abnormal.  Strep test negative, will send out for culture and we will call you with results Get plenty of rest and push fluids Zyrtec was prescribed Flonase was prescribed Viscous lidocaine prescribed.  This is an oral solution you can swish, and gargle as needed for symptomatic relief of sore throat.  Do not exceed 8 doses in a 24 hour period.  Do not use prior to eating, as this will numb your entire mouth.   Drink warm or cool liquids, use throat lozenges, or popsicles to help alleviate symptoms Take OTC ibuprofen or tylenol as needed for pain Follow up with PCP if symptoms persists Return or go to ER if patient has any new or worsening symptoms such as fever, chills, nausea, vomiting, worsening sore throat, cough, abdominal pain, chest pain, changes in bowel or bladder habits, etc..Marland Kitchen

## 2020-12-06 NOTE — ED Provider Notes (Addendum)
[facility]   [id number] 12/06/20 Arrival Time: 1016  PZ:WCHE THROAT  SUBJECTIVE: History from: patient.  Bethany Hall is a 30 y.o. female who presented to the urgent care for complaint of sore throat headache that started yesterday.  Denies sick exposure to COVID, strep, flu or mono, or precipitating event.  Has tried OTC medication without relief.  Symptoms are made worse with swallowing, but tolerating liquids and own secretions without difficulty.  Denies previous symptoms in the past.   Denies fever, chills, fatigue, ear pain, sinus pain, rhinorrhea, nasal congestion, cough, SOB, wheezing, chest pain, nausea, rash, changes in bowel or bladder habits.    ROS: As per HPI.  All other pertinent ROS negative.      Past Medical History:  Diagnosis Date  . Chronic pain    all over body per patient - no meds  . 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 GERD (gastroesophageal reflux disease)    occas tums prm  . Medical history non-contributory   . Neuromuscular disorder ([facility])    carpel tunnel bilateral   Past Surgical History:  Procedure Laterality Date  . CESAREAN SECTION  2011,2014,2016,02/2017   x 4  . DILATION AND EVACUATION N/A 08/07/2017   Procedure: DILATATION AND EVACUATION WITH ULTRASOUND GUIDANCE ;  Surgeon: Shea Evans, MD;  Location: [facility];  Service: Gynecology;  Laterality: N/A;   Allergies  Allergen Reactions  . Penicillins Rash    Has patient had a PCN reaction causing immediate rash, facial/tongue/throat swelling, SOB or lightheadedness with hypotension: Unknown Has patient had a PCN reaction causing severe rash involving mucus membranes or skin necrosis: Unknown Has patient had a PCN reaction that required hospitalization: Unknown Has patient had a PCN reaction occurring within the last 10 years: No If all of the above answers are "NO", then may proceed with Cephalosporin use. Infantile Reaction.   No current  facility-administered medications on file prior to encounter.   Current Outpatient Medications on File Prior to Encounter  Medication Sig Dispense Refill  . clotrimazole-betamethasone (LOTRISONE) cream Apply 1 application topically 2 (two) times daily. x7-10d 30 g 0   Social History   Socioeconomic History  . Marital status: Married    Spouse name: Not on file  . Number of children: Not on file  . Years of education: Not on file  . Highest education level: Not on file  Occupational History  . Not on file  Tobacco Use  . Smoking status: Never Smoker  . Smokeless tobacco: Never Used  Vaping Use  . Vaping Use: Never used  Substance and Sexual Activity  . Alcohol use: No    Comment: occ  . Drug use: No  . Sexual activity: Not Currently    Birth control/protection: None  Other Topics Concern  . Not on file  Social History Narrative  . Not on file   Social Determinants of Health   Financial Resource Strain: Not on file  Food Insecurity: Not on file  Transportation Needs: Not on file  Physical Activity: Not on file  Stress: Not on file  Social Connections: Not on file  Intimate Partner Violence: Not on file   Family History  Problem Relation Age of Onset  . Diabetes Mother   . Heart disease Father     OBJECTIVE:  Vitals:   12/06/20 1122  BP: 116/74  Pulse: 89  Resp: 16  Temp: 98.3 F (36.8 C)  TempSrc: Oral  SpO2: 97%     General appearance: alert; appears fatigued, but nontoxic, speaking in full sentences and managing own secretions HEENT: NCAT; Ears: EACs clear, TMs pearly gray with visible cone of light, without erythema; Eyes: PERRL, EOMI grossly; Nose: no obvious rhinorrhea; Throat: oropharynx clear, tonsils 1+ and mildly erythematous without white tonsillar exudates, uvula midline Neck: supple without LAD Lungs: CTA bilaterally without adventitious breath sounds; cough absent Heart: regular rate and rhythm.  Radial pulses 2+ symmetrical  bilaterally Skin: warm and dry Psychological: alert and cooperative; normal mood and affect  LABS: Results for orders placed or performed during the hospital encounter of 12/06/20 (from the past 24 hour(s))  POCT rapid strep A     Status: None   Collection Time: 12/06/20 11:34 AM  Result Value Ref Range   Rapid Strep A Screen Negative Negative     ASSESSMENT & PLAN:  1. Sore throat   2. Encounter for screening for COVID-19     Meds ordered this encounter  Medications  . cetirizine (ZYRTEC ALLERGY) 10 MG tablet    Sig: Take 1 tablet (10 mg total) by mouth daily.    Dispense:  30 tablet    Refill:  0  . fluticasone (FLONASE) 50 MCG/ACT nasal spray    Sig: Place 1 spray into both nostrils daily for 14 days.    Dispense:  16 g    Refill:  0  . lidocaine (XYLOCAINE) 2 % solution    Sig: Use as directed 15 mLs in the mouth or throat every 6 (six) hours as needed for mouth pain.    Dispense:  100 mL    Refill:  0   Discharge instructions  COVID-19 testing ordered.  It will take 2 to 7 days for results to return.  Someone will call if your result is abnormal.  Strep test negative, will send out for culture and we will call you with results Get plenty of rest and push fluids Zyrtec was prescribed Flonase was prescribed Viscous lidocaine prescribed.  This is an oral solution you can swish, and gargle as needed for symptomatic relief of sore throat.  Do not exceed 8 doses in a 24 hour period.  Do not use prior to eating, as this will numb your entire mouth.   Drink warm or cool liquids, use throat lozenges, or popsicles to help alleviate symptoms Take OTC ibuprofen or tylenol as needed for pain Follow up with PCP if symptoms persists Return or go to ER if patient has any new or worsening symptoms such as fever, chills, nausea, vomiting, worsening sore throat, cough, abdominal pain, chest pain, changes in bowel or bladder habits, etc...  Reviewed expectations re: course of current  medical issues. Questions answered. Outlined signs and symptoms indicating need for more acute intervention. Patient verbalized understanding. After Visit Summary given.         Durward Parcel, FNP 12/06/20 1155    Durward Parcel, FNP 12/06/20 1155

## 2020-12-06 NOTE — ED Triage Notes (Signed)
Sore throat and headache since yesterday.

## 2020-12-07 LAB — COVID-19, FLU A+B NAA
Influenza A, NAA: NOT DETECTED
Influenza B, NAA: NOT DETECTED
SARS-CoV-2, NAA: NOT DETECTED

## 2020-12-09 LAB — CULTURE, GROUP A STREP (THRC)

## 2021-01-12 ENCOUNTER — Other Ambulatory Visit: Payer: Self-pay

## 2021-01-12 ENCOUNTER — Ambulatory Visit: Payer: BC Managed Care – PPO | Admitting: Family Medicine

## 2021-01-12 ENCOUNTER — Encounter: Payer: Self-pay | Admitting: Family Medicine

## 2021-01-12 VITALS — BP 89/60 | HR 78 | Temp 98.2°F | Ht 59.0 in | Wt 130.0 lb

## 2021-01-12 DIAGNOSIS — N926 Irregular menstruation, unspecified: Secondary | ICD-10-CM | POA: Diagnosis not present

## 2021-01-12 LAB — WET PREP FOR TRICH, YEAST, CLUE
Clue Cell Exam: NEGATIVE
Trichomonas Exam: NEGATIVE
Yeast Exam: NEGATIVE

## 2021-01-12 LAB — PREGNANCY, URINE: Preg Test, Ur: NEGATIVE

## 2021-01-12 MED ORDER — LO LOESTRIN FE 1 MG-10 MCG / 10 MCG PO TABS: 1.0000 | ORAL_TABLET | Freq: Every day | ORAL | 11 refills | Status: DC

## 2021-01-12 NOTE — Progress Notes (Signed)
Acute Office Visit  Subjective:    Patient ID: Bethany Hall, female    DOB: 06-Jul-1991, 30 y.o.   MRN: [id number]  Chief Complaint  Patient presents with  . Menorrhagia    HPI Patient is in today for menorrhagia. She reports vaginal bleeding x 2 weeks with her last cycle. She has never had a cycle that lasted this long. The bleeding has been dark and has alternated between heavier and lighter. She is also having increased abdominal cramping. She also reports breast tenderness. She is sexually active and uses condoms for protection. She denies vaginal irritation, pain, or itching. Denies fever.   Past Medical History:  Diagnosis Date  . Chronic pain    all over body per patient - no meds  . 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 GERD (gastroesophageal reflux disease)    occas tums prm  . Medical history non-contributory   . Neuromuscular disorder (HCC)    carpel tunnel bilateral    Past Surgical History:  Procedure Laterality Date  . CESAREAN SECTION  2011,2014,2016,02/2017   x 4  . DILATION AND EVACUATION N/A 08/07/2017   Procedure: DILATATION AND EVACUATION WITH ULTRASOUND GUIDANCE ;  Surgeon: Shea Evans, MD;  Location: [facility];  Service: Gynecology;  Laterality: N/A;    Family History  Problem Relation Age of Onset  . Diabetes Mother   . Heart disease Father     Social History   Socioeconomic History  . Marital status: Married    Spouse name: Not on file  . Number of children: Not on file  . Years of education: Not on file  . Highest education level: Not on file  Occupational History  . Not on file  Tobacco Use  . Smoking status: Never Smoker  . Smokeless tobacco: Never Used  Vaping Use  . Vaping Use: Never used  Substance and Sexual Activity  . Alcohol use: No    Comment: occ  . Drug use: No  . Sexual activity: Not Currently    Birth control/protection: None  Other Topics Concern  . Not on file  Social History  Narrative  . Not on file   Social Determinants of Health   Financial Resource Strain: Not on file  Food Insecurity: Not on file  Transportation Needs: Not on file  Physical Activity: Not on file  Stress: Not on file  Social Connections: Not on file  Intimate Partner Violence: Not on file    Outpatient Medications Prior to Visit  Medication Sig Dispense Refill  . cetirizine (ZYRTEC ALLERGY) 10 MG tablet Take 1 tablet (10 mg total) by mouth daily. 30 tablet 0  . clotrimazole-betamethasone (LOTRISONE) cream Apply 1 application topically 2 (two) times daily. x7-10d 30 g 0  . fluticasone (FLONASE) 50 MCG/ACT nasal spray Place 1 spray into both nostrils daily for 14 days. 16 g 0  . lidocaine (XYLOCAINE) 2 % solution Use as directed 15 mLs in the mouth or throat every 6 (six) hours as needed for mouth pain. 100 mL 0   No facility-administered medications prior to visit.    Allergies  Allergen Reactions  . Penicillins Rash    Has patient had a PCN reaction causing immediate rash, facial/tongue/throat swelling, SOB or lightheadedness with hypotension: Unknown Has patient had a PCN reaction causing severe rash involving mucus membranes or skin necrosis: Unknown Has patient had a PCN reaction that required hospitalization: Unknown Has patient had a PCN reaction  occurring within the last 10 years: No If all of the above answers are "NO", then may proceed with Cephalosporin use. Infantile Reaction.    Review of Systems As per HPI.     Objective:    Physical Exam Vitals and nursing note reviewed.  Constitutional:      Appearance: Normal appearance.  Cardiovascular:     Rate and Rhythm: Normal rate and regular rhythm.     Heart sounds: Normal heart sounds. No murmur heard.   Pulmonary:     Effort: Pulmonary effort is normal. No respiratory distress.     Breath sounds: Normal breath sounds.  Abdominal:     General: Bowel sounds are normal. There is no distension.     Palpations:  Abdomen is soft.     Tenderness: There is no abdominal tenderness. There is no guarding or rebound.  Skin:    General: Skin is warm and dry.  Neurological:     General: No focal deficit present.     Mental Status: She is alert and oriented to person, place, and time.  Psychiatric:        Mood and Affect: Mood normal.        Behavior: Behavior normal.     BP (!) 89/60   Pulse 78   Temp 98.2 F (36.8 C) (Temporal)   Ht 4\' 11"  (1.499 m)   Wt 130 lb (59 kg)   BMI 26.26 kg/m  Wt Readings from Last 3 Encounters:  01/12/21 130 lb (59 kg)  11/28/20 132 lb (59.9 kg)  08/30/20 128 lb 12.8 oz (58.4 kg)    There are no preventive care reminders to display for this patient.  There are no preventive care reminders to display for this patient.   No results found for: TSH Lab Results  Component Value Date   WBC 8.6 10/20/2018   HGB 14.8 10/20/2018   HCT 44.9 10/20/2018   MCV 94 10/20/2018   PLT 222 10/20/2018   Lab Results  Component Value Date   NA 137 11/29/2020   K 4.2 11/29/2020   CO2 19 (L) 11/29/2020   GLUCOSE 92 11/29/2020   BUN 10 11/29/2020   CREATININE 0.63 11/29/2020   BILITOT 0.3 11/29/2020   ALKPHOS 61 11/29/2020   AST 16 11/29/2020   ALT 12 11/29/2020   PROT 7.1 11/29/2020   ALBUMIN 4.2 11/29/2020   CALCIUM 10.0 11/29/2020   No results found for: CHOL No results found for: HDL No results found for: LDLCALC No results found for: TRIG No results found for: CHOLHDL No results found for: 12/01/2020     Assessment & Plan:   Bethany Hall was seen today for menorrhagia.  Diagnoses and all orders for this visit:  Abnormal menses Negative urine pregnancy test today. Urine cytology pending. Wet prep negative. Will start on OCPs for menorrhagia. Return to office for new or worsening symptoms, or if symptoms persist. She has an endo appointment next week.  -     Pregnancy, urine -     Norethindrone-Ethinyl Estradiol-Fe Biphas (LO LOESTRIN FE) 1 MG-10 MCG / 10 MCG  tablet; Take 1 tablet by mouth daily. -     WET PREP FOR TRICH, YEAST, CLUE -     Urine cytology ancillary only  The patient indicates understanding of these issues and agrees with the plan.  Bethany Douglas, FNP

## 2021-01-12 NOTE — Addendum Note (Signed)
Addended by: Hessie Diener on: 01/12/2021 04:35 PM   Modules accepted: Orders

## 2021-01-12 NOTE — Patient Instructions (Signed)
Menorrhagia Menorrhagia is a form of abnormal uterine bleeding in which menstrual periods are heavy or last longer than normal. With menorrhagia, the periods may cause enough blood loss and cramping that a woman becomes unable to take part in her usual activities. What are the causes? Common causes of this condition include:  Polyps or fibroids. These are noncancerous growths in the uterus.  An imbalance of the hormones estrogen and progesterone.  Anovulation, which occurs when one of the ovaries does not release an egg during one or more months.  A problem with the thyroid gland (hypothyroidism).  Side effects of having an intrauterine device (IUD).  Side effects of some medicines, such as NSAIDs or blood thinners.  A bleeding disorder that stops the blood from clotting normally. In some cases, the cause of this condition is not known. What increases the risk? You are more likely to develop this condition if you have cancer of the uterus. What are the signs or symptoms? Symptoms of this condition include:  Routinely having to change your pad or tampon every 1-2 hours because it is soaked.  Needing to use pads and tampons at the same time because of heavy bleeding.  Needing to wake up to change your pads or tampons during the night.  Passing blood clots larger than 1 inch (2.5 cm) in size.  Having bleeding that lasts for more than 7 days.  Having symptoms of low iron levels (anemia), such as tiredness (fatigue) or shortness of breath. How is this diagnosed? This condition may be diagnosed based on:  A physical exam.  Your symptoms and menstrual history.  Tests, such as: ? Blood tests to check if you are pregnant or if you have hormonal changes, a bleeding or thyroid disorder, anemia, or other problems. ? Pap test to check for cancerous changes, infections, or inflammation. ? Endometrial biopsy. This test involves removing a tissue sample from the lining of the uterus  (endometrium) to be examined under a microscope. ? Pelvic ultrasound. This test uses sound waves to create images of your uterus, ovaries, and vagina. The images can show if you have fibroids or other growths. ? Hysteroscopy. For this test, a thin, flexible tube with a light on the end (hysteroscope) is used to look inside your uterus. How is this treated? Treatment may not be needed for this condition. If it is needed, the best treatment for you will depend on:  Whether you need to prevent pregnancy.  Your desire to have children in the future.  The cause and severity of your bleeding.  Your personal preference. Medicine Medicines are the first step in treatment. You may be treated with:  Hormonal birth control methods. These treatments reduce bleeding during your menstrual period. They include: ? Birth control pills. ? Skin patch. ? Vaginal ring. ? Shots (injections) that you get every 3 months. ? Hormonal IUD. ? Implants that go under the skin.  Medicines that thicken the blood and slow bleeding.  Medicines that reduce swelling, such as ibuprofen.  Medicines that contain an artificial (synthetic) hormone called progestin.  Medicines that make the ovaries stop working for a short time.  Iron supplements to treat anemia.   Surgery If medicines do not work, surgery may be done. Surgical options may include:  Dilation and curettage (D&C). In this procedure, your health care provider opens the lowest part of the uterus (cervix) and then scrapes or suctions tissue from the endometrium. This reduces menstrual bleeding.  Operative hysteroscopy. In this procedure,   a hysteroscope is used to view your uterus and help remove polyps that may be causing heavy periods.  Endometrial ablation. This is when various techniques are used to permanently destroy your entire endometrium. After endometrial ablation, most women have little or no menstrual flow. This procedure reduces your ability to  become pregnant.  Endometrial resection. In this procedure, an electrosurgical wire loop is used to remove the endometrium. This procedure reduces your ability to become pregnant.  Hysterectomy. This is surgical removal of your uterus. This is a permanent procedure that stops menstrual periods. Pregnancy is not possible after a hysterectomy. Follow these instructions at home: Medicines  Take over-the-counter and prescription medicines only as told by your health care provider. This includes iron pills.  Do not change or switch medicines without asking your health care provider.  Do not take aspirin or medicines that contain aspirin 1 week before or during your menstrual period. Aspirin may make bleeding worse. Managing constipation Your iron pills may cause constipation. If you are taking prescription iron supplements, you may need to take these actions to prevent or treat constipation:  Drink enough fluid to keep your urine pale yellow.  Take over-the-counter or prescription medicines.  Eat foods that are high in fiber, such as beans, whole grains, and fresh fruits and vegetables.  Limit foods that are high in fat and processed sugars, such as fried or sweet foods. General instructions  If you need to change your sanitary pad or tampon more than once every 2 hours, limit your activity until the bleeding stops.  Eat well-balanced meals, including foods that are high in iron. Foods that have a lot of iron include leafy green vegetables, meat, liver, eggs, and whole-grain breads and cereals.  Do not try to lose weight until the abnormal bleeding has stopped and your blood iron level is back to normal. If you need to lose weight, work with your health care provider to lose weight safely.  Keep all follow-up visits. This is important. Contact a health care provider if:  You soak through a pad or tampon every 1 or 2 hours, and this happens every time you have a period.  You need to use  pads and tampons at the same time because you are bleeding so much.  You have nausea, vomiting, diarrhea, or other problems related to medicines you are taking. Get help right away if:  You soak through more than a pad or tampon in 1 hour.  You pass clots bigger than 1 inch (2.5 cm) wide.  You feel short of breath.  You feel like your heart is beating too fast.  You feel dizzy or you faint.  You feel very weak or tired. Summary  Menorrhagia is a form of abnormal uterine bleeding in which menstrual periods are heavy or last longer than normal.  Treatment may not be needed for this condition. If it is needed, it may include medicines or procedures.  Take over-the-counter and prescription medicines only as told by your health care provider. This includes iron pills.  Get help right away if you have heavy bleeding that soaks through more than a pad or tampon in 1 hour, you pass large clots, or you feel dizzy, short of breath, or very weak or tired. This information is not intended to replace advice given to you by your health care provider. Make sure you discuss any questions you have with your health care provider. Document Revised: 06/06/2020 Document Reviewed: 06/06/2020 Elsevier Patient Education  2021   [employer] Inc[employer]  

## 2021-01-13 LAB — GC/CHLAMYDIA PROBE AMP
Chlamydia trachomatis, NAA: NEGATIVE
Neisseria Gonorrhoeae by PCR: NEGATIVE

## 2021-01-16 ENCOUNTER — Encounter: Payer: Self-pay | Admitting: Endocrinology

## 2021-01-16 ENCOUNTER — Ambulatory Visit (INDEPENDENT_AMBULATORY_CARE_PROVIDER_SITE_OTHER): Payer: BC Managed Care – PPO | Admitting: Endocrinology

## 2021-01-16 ENCOUNTER — Other Ambulatory Visit: Payer: Self-pay

## 2021-01-16 DIAGNOSIS — L819 Disorder of pigmentation, unspecified: Secondary | ICD-10-CM | POA: Diagnosis not present

## 2021-01-16 LAB — TSH: TSH: 1.05 u[IU]/mL (ref 0.35–4.50)

## 2021-01-16 LAB — CORTISOL
Cortisol, Plasma: 22.8 ug/dL
Cortisol, Plasma: 8.9 ug/dL

## 2021-01-16 LAB — T4, FREE: Free T4: 0.85 ng/dL (ref 0.60–1.60)

## 2021-01-16 MED ORDER — COSYNTROPIN 0.25 MG IJ SOLR
0.2500 mg | Freq: Once | INTRAMUSCULAR | Status: AC
  Administered 2021-01-16: 0.25 mg via INTRAVENOUS

## 2021-01-16 NOTE — Progress Notes (Signed)
Subjective:    Patient ID: Bethany Hall, female    DOB: 07-28-1991, 30 y.o.   MRN: [id number]  HPI Pt is referred by Dr Nadine Counts for hyperpigmentation.  no h/o abdominal injury.  No h/o cancer, thyroid problems, seizures, hypoglycemia, amyloidosis, tuberculosis, or diabetes.  No recent steroids.  No h/o ketoconazole, rifampin, or dilantin.  He has never had adrenal imaging.  She also reports postural lightheadedness, myalgias, dry skin, jittery, fatigue, and hair loss.   Past Medical History:  Diagnosis Date  . Chronic pain    all over body per patient - no meds  . 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 GERD (gastroesophageal reflux disease)    occas tums prm  . Medical history non-contributory   . Neuromuscular disorder (HCC)    carpel tunnel bilateral    Past Surgical History:  Procedure Laterality Date  . CESAREAN SECTION  2011,2014,2016,02/2017   x 4  . DILATION AND EVACUATION N/A 08/07/2017   Procedure: DILATATION AND EVACUATION WITH ULTRASOUND GUIDANCE ;  Surgeon: Shea Evans, MD;  Location: [facility];  Service: Gynecology;  Laterality: N/A;    Social History   Socioeconomic History  . Marital status: Married    Spouse name: Not on file  . Number of children: Not on file  . Years of education: Not on file  . Highest education level: Not on file  Occupational History  . Not on file  Tobacco Use  . Smoking status: Never Smoker  . Smokeless tobacco: Never Used  Vaping Use  . Vaping Use: Never used  Substance and Sexual Activity  . Alcohol use: No    Comment: occ  . Drug use: No  . Sexual activity: Not Currently    Birth control/protection: None  Other Topics Concern  . Not on file  Social History Narrative  . Not on file   Social Determinants of Health   Financial Resource Strain: Not on file  Food Insecurity: Not on file  Transportation Needs: Not on file  Physical Activity: Not on file  Stress: Not on file  Social  Connections: Not on file  Intimate Partner Violence: Not on file    Current Outpatient Medications on File Prior to Visit  Medication Sig Dispense Refill  . Norethindrone-Ethinyl Estradiol-Fe Biphas (LO LOESTRIN FE) 1 MG-10 MCG / 10 MCG tablet Take 1 tablet by mouth daily. 28 tablet 11   No current facility-administered medications on file prior to visit.    Allergies  Allergen Reactions  . Penicillins Rash    Has patient had a PCN reaction causing immediate rash, facial/tongue/throat swelling, SOB or lightheadedness with hypotension: Unknown Has patient had a PCN reaction causing severe rash involving mucus membranes or skin necrosis: Unknown Has patient had a PCN reaction that required hospitalization: Unknown Has patient had a PCN reaction occurring within the last 10 years: No If all of the above answers are "NO", then may proceed with Cephalosporin use. Infantile Reaction.    Family History  Problem Relation Age of Onset  . Diabetes Mother   . Heart disease Father   . Adrenal disorder Neg Hx     BP 120/70 (BP Location: Right Arm, Patient Position: Sitting, Cuff Size: Normal)   Pulse 75   Ht 4\' 11"  (1.499 m)   Wt 127 lb 9.6 oz (57.9 kg)   SpO2 99%   BMI 25.77 kg/m   Review of Systems She reports menses every 2 weeks.  Denies weight change, n/v, cold intolerance, and vitiligo.      Objective:   Physical Exam VS: see vs page GEN: no distress HEAD: head: no deformity eyes: no periorbital swelling, no proptosis external nose and ears are normal NECK: supple, thyroid is not enlarged CHEST WALL: no deformity LUNGS: clear to auscultation CV: reg rate and rhythm, no murmur.  MUSCULOSKELETAL: gait is normal and steady EXTEMITIES: no deformity.  no leg edema NEURO:  readily moves all 4's.  sensation is intact to touch on all 4's.   SKIN:  Normal texture and temperature.  No rash or suspicious lesion is visible.   NODES:  None palpable at the neck.   PSYCH: alert,  well-oriented.  Does not appear anxious nor depressed.    Lab Results  Component Value Date   CALCIUM 10.0 11/29/2020    Lab Results  Component Value Date   CREATININE 0.63 11/29/2020   BUN 10 11/29/2020   NA 137 11/29/2020   K 4.2 11/29/2020   CL 104 11/29/2020   CO2 19 (L) 11/29/2020   I have reviewed outside records, and summarized: Pt was noted to have hyperpigmentation, and referred here.  Main symptom was menorrhagia  ACTH stimulation test is done: baseline cortisol level=9 then Cosyntropin 250 mcg is given im 45 minutes later, cortisol level=23 (normal response)     Assessment & Plan:  Hyperpigmentation, new to me, uncertain etiology and prognosis.  Check ACTH and [facility]

## 2021-01-16 NOTE — Progress Notes (Signed)
Pt had cosyntropin injection administered at 2:42pm in the Lt deltoid.

## 2021-01-22 LAB — ALPHA MELANOCYTE STIM HORMONE: ALPHA MELANOCYTE STIM HORMONE: 14.8 pg/mL (ref 0–100.0)

## 2021-01-22 LAB — ACTH: C206 ACTH: 14 pg/mL (ref 6–50)

## 2021-04-05 ENCOUNTER — Other Ambulatory Visit (HOSPITAL_COMMUNITY)
Admission: RE | Admit: 2021-04-05 | Discharge: 2021-04-05 | Disposition: A | Discharge status: Home / Self Care | Payer: BC Managed Care – PPO | Source: Ambulatory Visit | Attending: Family Medicine | Admitting: Family Medicine

## 2021-04-05 ENCOUNTER — Other Ambulatory Visit: Payer: Self-pay

## 2021-04-05 ENCOUNTER — Encounter: Payer: Self-pay | Admitting: Family Medicine

## 2021-04-05 ENCOUNTER — Ambulatory Visit: Payer: BC Managed Care – PPO | Admitting: Family Medicine

## 2021-04-05 VITALS — BP 99/66 | HR 79 | Temp 98.7°F | Ht 59.0 in | Wt 123.5 lb

## 2021-04-05 DIAGNOSIS — Z113 Encounter for screening for infections with a predominantly sexual mode of transmission: Secondary | ICD-10-CM | POA: Diagnosis not present

## 2021-04-05 DIAGNOSIS — K649 Unspecified hemorrhoids: Secondary | ICD-10-CM

## 2021-04-05 MED ORDER — PROCTOFOAM HC 1-1 % EX FOAM: 1.0000 | Freq: Two times a day (BID) | CUTANEOUS | 2 refills | Status: DC

## 2021-04-05 NOTE — Progress Notes (Signed)
Acute Office Visit  Subjective:    Patient ID: Bethany Hall, female    DOB: 04-Jul-1991, 30 y.o.   MRN: [id number]  Chief Complaint  Patient presents with   Hemorrhoids    HPI Patient is in today for hemorrhoids. This has been going intermittently for a few years. She has used prep H cream, wipes, soaps, and ice without improvement. She has been struggling with constipation and straining. She takes a daily stool softener. Denies bleeding. She only has pain with bowel movements. She does reports some itching. She previously had a foam prescribed that was helpful.   She has a new partner and would like to have STD testing done. Currently sexually active with one female partner. She is using protection. Denies symptoms or known exposure, would like liked to be screened.   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   GERD (gastroesophageal reflux disease)    occas tums prm   Medical history non-contributory    Neuromuscular disorder (HCC)    carpel tunnel bilateral    Past Surgical History:  Procedure Laterality Date   CESAREAN SECTION  2011,2014,2016,02/2017   x 4   DILATION AND EVACUATION N/A 08/07/2017   Procedure: DILATATION AND EVACUATION WITH ULTRASOUND GUIDANCE ;  Surgeon: Bethany Evans, MD;  Location: [facility];  Service: Gynecology;  Laterality: N/A;    Family History  Problem Relation Age of Onset   Diabetes Mother    Heart disease Father    Adrenal disorder Neg Hx     Social History   Socioeconomic History   Marital status: Married    Spouse name: Not on file   Number of children: Not on file   Years of education: Not on file   Highest education level: Not on file  Occupational History   Not on file  Tobacco Use   Smoking status: Never   Smokeless tobacco: Never  Vaping Use   Vaping Use: Never used  Substance and Sexual Activity   Alcohol  use: No    Comment: occ   Drug use: No   Sexual activity: Not Currently    Birth control/protection: None  Other Topics Concern   Not on file  Social History Narrative   Not on file   Social Determinants of Health   Financial Resource Strain: Not on file  Food Insecurity: Not on file  Transportation Needs: Not on file  Physical Activity: Not on file  Stress: Not on file  Social Connections: Not on file  Intimate Partner Violence: Not on file    Outpatient Medications Prior to Visit  Medication Sig Dispense Refill   Norethindrone-Ethinyl Estradiol-Fe Biphas (LO LOESTRIN FE) 1 MG-10 MCG / 10 MCG tablet Take 1 tablet by mouth daily. 28 tablet 11   No facility-administered medications prior to visit.    Allergies  Allergen Reactions   Penicillins Rash    Has patient had a PCN reaction causing immediate rash, facial/tongue/throat swelling, SOB or lightheadedness with hypotension: Unknown Has patient had a PCN reaction causing severe rash involving mucus membranes or skin necrosis: Unknown Has patient had a PCN reaction that required hospitalization: Unknown Has patient had a PCN reaction occurring within the last 10 years: No If all of the above answers are "NO", then may proceed with Cephalosporin use. Infantile Reaction.  Review of Systems As per HPI.     Objective:    Physical Exam Vitals and nursing note reviewed.  Constitutional:      General: She is not in acute distress.    Appearance: She is not ill-appearing, toxic-appearing or diaphoretic.  Cardiovascular:     Rate and Rhythm: Normal rate. Rhythm irregular.     Heart sounds: Normal heart sounds. No murmur heard. Pulmonary:     Effort: Pulmonary effort is normal. No respiratory distress.     Breath sounds: Normal breath sounds.  Musculoskeletal:     Right lower leg: No edema.     Left lower leg: No edema.  Skin:    General: Skin is warm.  Neurological:     General: No focal deficit present.      Mental Status: She is alert and oriented to person, place, and time.  Psychiatric:        Mood and Affect: Mood normal.        Behavior: Behavior normal.    BP 99/66   Pulse 79   Temp 98.7 F (37.1 C) (Oral)   Ht 4\' 11"  (1.499 m)   Wt 123 lb 8 oz (56 kg)   BMI 24.94 kg/m  Wt Readings from Last 3 Encounters:  04/05/21 123 lb 8 oz (56 kg)  01/16/21 127 lb 9.6 oz (57.9 kg)  01/12/21 130 lb (59 kg)    Health Maintenance Due  Topic Date Due   TETANUS/TDAP  Never done    There are no preventive care reminders to display for this patient.   Lab Results  Component Value Date   TSH 1.05 01/16/2021   Lab Results  Component Value Date   WBC 8.6 10/20/2018   HGB 14.8 10/20/2018   HCT 44.9 10/20/2018   MCV 94 10/20/2018   PLT 222 10/20/2018   Lab Results  Component Value Date   NA 137 11/29/2020   K 4.2 11/29/2020   CO2 19 (L) 11/29/2020   GLUCOSE 92 11/29/2020   BUN 10 11/29/2020   CREATININE 0.63 11/29/2020   BILITOT 0.3 11/29/2020   ALKPHOS 61 11/29/2020   AST 16 11/29/2020   ALT 12 11/29/2020   PROT 7.1 11/29/2020   ALBUMIN 4.2 11/29/2020   CALCIUM 10.0 11/29/2020   No results found for: CHOL No results found for: HDL No results found for: LDLCALC No results found for: TRIG No results found for: CHOLHDL No results found for: 12/01/2020     Assessment & Plan:   Bethany Hall was seen today for hemorrhoids.  Diagnoses and all orders for this visit:  Screening for STDs (sexually transmitted diseases) Labs pending as below.  -     Urine cytology ancillary only -     HepB+HepC+HIV Panel -     HSV(herpes smplx)abs-1+2(IgG+IgM)-bld -     RPR  Hemorrhoids, unspecified hemorrhoid type Refill provided. Continue stool softeners, hydration, and high fiber diet.  -     hydrocortisone-pramoxine (PROCTOFOAM HC) rectal foam; Place 1 applicator rectally 2 (two) times daily. x7 day per flare.  Return to office for new or worsening symptoms, or if symptoms persist.   The  patient indicates understanding of these issues and agrees with the plan.   Bethany Douglas, FNP

## 2021-04-06 LAB — HEPB+HEPC+HIV PANEL
HIV Screen 4th Generation wRfx: NONREACTIVE
Hep B C IgM: NEGATIVE
Hep B Core Total Ab: NEGATIVE
Hep B E Ab: NEGATIVE
Hep B E Ag: NEGATIVE
Hep B Surface Ab, Qual: REACTIVE
Hep C Virus Ab: <0.1 s/co ratio (ref 0.0–0.9)
Hepatitis B Surface Ag: NEGATIVE

## 2021-04-06 LAB — RPR: RPR Ser Ql: NONREACTIVE

## 2021-04-06 LAB — HSV(HERPES SMPLX)ABS-I+II(IGG+IGM)-BLD
HSV 1 Glycoprotein G Ab, IgG: 25.8 index — ABNORMAL HIGH (ref 0.00–0.90)
HSV 2 IgG, Type Spec: <0.91 index (ref 0.00–0.90)
HSVI/II Comb IgM: <0.91 Ratio (ref 0.00–0.90)

## 2021-04-10 LAB — URINE CYTOLOGY ANCILLARY ONLY
Chlamydia: NEGATIVE
Comment: NEGATIVE
Comment: NORMAL
Neisseria Gonorrhea: NEGATIVE

## 2021-04-16 ENCOUNTER — Ambulatory Visit: Payer: BC Managed Care – PPO | Admitting: Family Medicine

## 2021-04-19 ENCOUNTER — Other Ambulatory Visit: Payer: Self-pay

## 2021-04-19 ENCOUNTER — Encounter: Payer: Self-pay | Admitting: Family Medicine

## 2021-04-19 ENCOUNTER — Ambulatory Visit: Payer: BC Managed Care – PPO | Admitting: Family Medicine

## 2021-04-19 VITALS — BP 115/73 | HR 83 | Temp 97.6°F | Resp 20 | Ht 59.0 in | Wt 122.0 lb

## 2021-04-19 DIAGNOSIS — R350 Frequency of micturition: Secondary | ICD-10-CM | POA: Diagnosis not present

## 2021-04-19 DIAGNOSIS — N760 Acute vaginitis: Secondary | ICD-10-CM | POA: Diagnosis not present

## 2021-04-19 DIAGNOSIS — F411 Generalized anxiety disorder: Secondary | ICD-10-CM | POA: Diagnosis not present

## 2021-04-19 DIAGNOSIS — F321 Major depressive disorder, single episode, moderate: Secondary | ICD-10-CM

## 2021-04-19 DIAGNOSIS — K649 Unspecified hemorrhoids: Secondary | ICD-10-CM

## 2021-04-19 LAB — MICROSCOPIC EXAMINATION
RBC, Urine: NONE SEEN /hpf (ref 0–2)
WBC, UA: NONE SEEN /hpf (ref 0–5)

## 2021-04-19 LAB — URINALYSIS, COMPLETE
Bilirubin, UA: NEGATIVE
Glucose, UA: NEGATIVE
Ketones, UA: NEGATIVE
Leukocytes,UA: NEGATIVE
Nitrite, UA: NEGATIVE
RBC, UA: NEGATIVE
Specific Gravity, UA: 1.02 (ref 1.005–1.030)
Urobilinogen, Ur: 0.2 mg/dL (ref 0.2–1.0)
pH, UA: 7 (ref 5.0–7.5)

## 2021-04-19 LAB — WET PREP FOR TRICH, YEAST, CLUE
Clue Cell Exam: NEGATIVE
Trichomonas Exam: NEGATIVE
Yeast Exam: NEGATIVE

## 2021-04-19 MED ORDER — FLUCONAZOLE 150 MG PO TABS: 150.0000 mg | ORAL_TABLET | Freq: Once | ORAL | 0 refills | Status: AC

## 2021-04-19 MED ORDER — WITCH HAZEL-GLYCERIN EX PADS: 1.0000 "application " | MEDICATED_PAD | CUTANEOUS | 12 refills | Status: DC | PRN

## 2021-04-19 MED ORDER — ESCITALOPRAM OXALATE 10 MG PO TABS
10.0000 mg | ORAL_TABLET | Freq: Every day | ORAL | 5 refills | Status: DC
Start: 2021-04-19 — End: 2021-10-03

## 2021-04-19 NOTE — Patient Instructions (Signed)

## 2021-04-19 NOTE — Progress Notes (Signed)
Acute Office Visit  Subjective:    Patient ID: Bethany Hall, female    DOB: Jan 03, 1991, 30 y.o.   MRN: [id number]  Chief Complaint  Patient presents with   Vaginitis    HPI Patient is in today for vaginal itching for 2-3 days. She has noticed some thick white discharge yesterday. She denies a rash. She reports that she isn't sure that she got a good swab with the wet prep today as it was uncomfortable to do. She has had yeast infections in the past and this feels the same to her.   She is also reporting for urinary frequency and lower back pain for 2 days. Denies fever, chills, abdominal pain, or urgency. She stays well hydrated   She continues to have rectal pain and itching from hemorrhoids. She has been using preparation H wipes and the prescription foam without improvement. Reports a lot of straining with BM despite taking stool softeners.   She is interested in trying something for anxiety. She feels like she is always on edge. She is currently going through a divorce. She has a lot of stress at work as well.   Depression screen [facility] 2/9 04/19/2021 01/12/2021 11/28/2020  Decreased Interest - 0 0  Down, Depressed, Hopeless 3 0 0  PHQ - 2 Score 3 0 0  Altered sleeping 3 - 0  Tired, decreased energy 2 - 0  Change in appetite 3 - 0  Feeling bad or failure about yourself  2 - 0  Trouble concentrating - - 0  Moving slowly or fidgety/restless 2 - 0  Suicidal thoughts 0 - 0  PHQ-9 Score 15 - 0  Difficult doing work/chores Somewhat difficult - -   GAD 7 : Generalized Anxiety Score 04/19/2021 08/30/2020  Nervous, Anxious, on Edge 3 3  Control/stop worrying 3 3  Worry too much - different things 3 3  Trouble relaxing 3 3  Restless 3 3  Easily annoyed or irritable 3 3  Afraid - awful might happen 2 0  Total GAD 7 Score 20 18  Anxiety Difficulty - Not difficult at all      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 - no meds   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   GERD (gastroesophageal reflux disease)    occas tums prm   Medical history non-contributory    Neuromuscular disorder (HCC)    carpel tunnel bilateral    Past Surgical History:  Procedure Laterality Date   CESAREAN SECTION  2011,2014,2016,02/2017   x 4   DILATION AND EVACUATION N/A 08/07/2017   Procedure: DILATATION AND EVACUATION WITH ULTRASOUND GUIDANCE ;  Surgeon: Shea Evans, MD;  Location: [facility];  Service: Gynecology;  Laterality: N/A;    Family History  Problem Relation Age of Onset   Diabetes Mother    Heart disease Father    Adrenal disorder Neg Hx     Social History   Socioeconomic History   Marital status: Married    Spouse name: Not on file   Number of children: Not on file   Years of education: Not on file   Highest education level: Not on file  Occupational History   Not on file  Tobacco Use   Smoking status: Never   Smokeless tobacco: Never  Vaping Use   Vaping Use: Never used  Substance and Sexual Activity   Alcohol use:  No    Comment: occ   Drug use: No   Sexual activity: Not Currently    Birth control/protection: None  Other Topics Concern   Not on file  Social History Narrative   Not on file   Social Determinants of Health   Financial Resource Strain: Not on file  Food Insecurity: Not on file  Transportation Needs: Not on file  Physical Activity: Not on file  Stress: Not on file  Social Connections: Not on file  Intimate Partner Violence: Not on file    Outpatient Medications Prior to Visit  Medication Sig Dispense Refill   hydrocortisone-pramoxine (PROCTOFOAM HC) rectal foam Place 1 applicator rectally 2 (two) times daily. x7 day per flare. 10 g 2   Norethindrone-Ethinyl Estradiol-Fe Biphas (LO LOESTRIN FE) 1 MG-10 MCG / 10 MCG tablet Take 1 tablet by mouth daily. 28 tablet 11   No facility-administered medications prior to visit.    Allergies  Allergen Reactions    Penicillins Rash    Has patient had a PCN reaction causing immediate rash, facial/tongue/throat swelling, SOB or lightheadedness with hypotension: Unknown Has patient had a PCN reaction causing severe rash involving mucus membranes or skin necrosis: Unknown Has patient had a PCN reaction that required hospitalization: Unknown Has patient had a PCN reaction occurring within the last 10 years: No If all of the above answers are "NO", then may proceed with Cephalosporin use. Infantile Reaction.    Review of Systems As per HPI.     Objective:    Physical Exam Vitals and nursing note reviewed.  Constitutional:      General: She is not in acute distress.    Appearance: She is not ill-appearing, toxic-appearing or diaphoretic.  Cardiovascular:     Rate and Rhythm: Normal rate and regular rhythm.     Heart sounds: Normal heart sounds. No murmur heard. Pulmonary:     Effort: Pulmonary effort is normal.     Breath sounds: Normal breath sounds.  Abdominal:     General: Bowel sounds are normal. There is no distension.     Palpations: Abdomen is soft.     Tenderness: There is no abdominal tenderness. There is no right CVA tenderness, left CVA tenderness, guarding or rebound.  Skin:    General: Skin is warm and dry.  Neurological:     General: No focal deficit present.     Mental Status: She is alert and oriented to person, place, and time.  Psychiatric:        Behavior: Behavior normal.   Urine dipstick shows positive for protein.  Micro exam: 0 WBC's per HPF, 0 RBC's per HPF, and few+ bacteria.  Microscopic wet-mount exam shows negative clue cell, neg trich, neg yeast, 0-2 wbc, many bacteria, many epithelial cells.    BP 115/73   Pulse 83   Temp 97.6 F (36.4 C) (Temporal)   Resp 20   Ht 4\' 11"  (1.499 m)   Wt 122 lb (55.3 kg)   SpO2 100%   BMI 24.64 kg/m  Wt Readings from Last 3 Encounters:  04/19/21 122 lb (55.3 kg)  04/05/21 123 lb 8 oz (56 kg)  01/16/21 127 lb 9.6 oz  (57.9 kg)    Health Maintenance Due  Topic Date Due   TETANUS/TDAP  Never done    There are no preventive care reminders to display for this patient.   Lab Results  Component Value Date   TSH 1.05 01/16/2021   Lab Results  Component Value Date  WBC 8.6 10/20/2018   HGB 14.8 10/20/2018   HCT 44.9 10/20/2018   MCV 94 10/20/2018   PLT 222 10/20/2018   Lab Results  Component Value Date   NA 137 11/29/2020   K 4.2 11/29/2020   CO2 19 (L) 11/29/2020   GLUCOSE 92 11/29/2020   BUN 10 11/29/2020   CREATININE 0.63 11/29/2020   BILITOT 0.3 11/29/2020   ALKPHOS 61 11/29/2020   AST 16 11/29/2020   ALT 12 11/29/2020   PROT 7.1 11/29/2020   ALBUMIN 4.2 11/29/2020   CALCIUM 10.0 11/29/2020   No results found for: CHOL No results found for: HDL No results found for: LDLCALC No results found for: TRIG No results found for: CHOLHDL No results found for: TXMI6O     Assessment & Plan:   Bethany Hall was seen today for vaginitis.  Diagnoses and all orders for this visit:  Urinary frequency Negative UA, culture pending.  -     Urinalysis, Complete -     Urine Culture  Acute vaginitis Negative wet prep, patient isn't sure she did a good swab. Diflucan as below. Follow up in no improvement. Recent negative STD testing.  -     WET PREP FOR TRICH, YEAST, CLUE -     fluconazole (DIFLUCAN) 150 MG tablet; Take 1 tablet (150 mg total) by mouth once for 1 dose.  Hemorrhoids, unspecified hemorrhoid type Try tucks pads with foam. Discussed probiotic, fiber supplement, and sitz baths. Discussed referral if no improvement.  -     witch hazel-glycerin (TUCKS) pad; Apply 1 application topically as needed for itching.  Generalized anxiety disorder Uncontrolled. Start lexapro.  -     escitalopram (LEXAPRO) 10 MG tablet; Take 1 tablet (10 mg total) by mouth daily.  Depression, major, single episode, moderate (HCC) Uncontrolled. No SI. Start lexapro.  -     escitalopram (LEXAPRO) 10 MG  tablet; Take 1 tablet (10 mg total) by mouth daily.  Return in about 6 weeks (around 05/31/2021) for with PCP for medication follow up.  The patient indicates understanding of these issues and agrees with the plan.   Gabriel Earing, FNP

## 2021-04-21 LAB — URINE CULTURE

## 2021-04-23 ENCOUNTER — Other Ambulatory Visit: Payer: Self-pay | Admitting: Family Medicine

## 2021-04-23 ENCOUNTER — Encounter: Payer: Self-pay | Admitting: Family Medicine

## 2021-04-23 DIAGNOSIS — N760 Acute vaginitis: Secondary | ICD-10-CM

## 2021-04-23 MED ORDER — FLUCONAZOLE 150 MG PO TABS: 150.0000 mg | ORAL_TABLET | Freq: Once | ORAL | 0 refills | Status: AC

## 2021-04-27 ENCOUNTER — Encounter: Payer: Self-pay | Admitting: Family Medicine

## 2021-04-27 ENCOUNTER — Other Ambulatory Visit: Payer: Self-pay

## 2021-04-27 ENCOUNTER — Ambulatory Visit: Payer: BC Managed Care – PPO | Admitting: Family Medicine

## 2021-04-27 VITALS — BP 118/82 | HR 89 | Temp 98.1°F | Ht 59.0 in | Wt 123.2 lb

## 2021-04-27 DIAGNOSIS — R112 Nausea with vomiting, unspecified: Secondary | ICD-10-CM

## 2021-04-27 DIAGNOSIS — K219 Gastro-esophageal reflux disease without esophagitis: Secondary | ICD-10-CM

## 2021-04-27 LAB — PREGNANCY, URINE: Preg Test, Ur: NEGATIVE

## 2021-04-27 MED ORDER — FAMOTIDINE 20 MG PO TABS: 20.0000 mg | ORAL_TABLET | Freq: Two times a day (BID) | ORAL | 0 refills | Status: DC

## 2021-04-27 MED ORDER — OMEPRAZOLE 20 MG PO CPDR: 20.0000 mg | DELAYED_RELEASE_CAPSULE | Freq: Every day | ORAL | 0 refills | Status: DC

## 2021-04-27 MED ORDER — ONDANSETRON 8 MG PO TBDP: 8.0000 mg | ORAL_TABLET | Freq: Three times a day (TID) | ORAL | 0 refills | Status: DC | PRN

## 2021-04-27 NOTE — Patient Instructions (Signed)
[employer] Current Therapy 2021 (pp. 213-216). [place], [place]: Elsevier.">  Gastroesophageal Reflux Disease, Adult Gastroesophageal reflux (GER) happens when acid from the stomach flows up into the tube that connects the mouth and the stomach (esophagus). Normally, food travels down the esophagus and stays in the stomach to be digested. However, when a person has GER, food and stomach acid sometimes move back up into the esophagus. If this becomes a more serious problem, the person may be diagnosed with a disease called gastroesophageal reflux disease (GERD). GERD occurs when the reflux: Happens often. Causes frequent or severe symptoms. Causes problems such as damage to the esophagus. When stomach acid comes in contact with the esophagus, the acid may cause inflammation in the esophagus. Over time, GERD may create small holes (ulcers) in the lining of the esophagus. What are the causes? This condition is caused by a problem with the muscle between the esophagus and the stomach (lower esophageal sphincter, or LES). Normally, the LES muscle closes after food passes through the esophagus to the stomach. When the LES is weakened or abnormal, it does not close properly, and that allows food and stomach acid to go back up into theesophagus. The LES can be weakened by certain dietary substances, medicines, and medical conditions, including: Tobacco use. Pregnancy. Having a hiatal hernia. Alcohol use. Certain foods and beverages, such as coffee, chocolate, onions, and peppermint. What increases the risk? You are more likely to develop this condition if you: Have an increased body weight. Have a connective tissue disorder. Take NSAIDs, such as ibuprofen. What are the signs or symptoms? Symptoms of this condition include: Heartburn. Difficult or painful swallowing and the feeling of having a lump in the throat. A bitter taste in the mouth. Bad breath and having a large amount of saliva. Having an  upset or bloated stomach and belching. Chest pain. Different conditions can cause chest pain. Make sure you see your health care provider if you experience chest pain. Shortness of breath or wheezing. Ongoing (chronic) cough or a nighttime cough. Wearing away of tooth enamel. Weight loss. How is this diagnosed? This condition may be diagnosed based on a medical history and a physical exam. To determine if you have mild or severe GERD, your health care provider may also monitor how you respond to treatment. You may also have tests, including: A test to examine your stomach and esophagus with a small camera (endoscopy). A test that measures the acidity level in your esophagus. A test that measures how much pressure is on your esophagus. A barium swallow or modified barium swallow test to show the shape, size, and functioning of your esophagus. How is this treated? Treatment for this condition may vary depending on how severe your symptoms are. Your health care provider may recommend: Changes to your diet. Medicine. Surgery. The goal of treatment is to help relieve your symptoms and to preventcomplications. Follow these instructions at home: Eating and drinking  Follow a diet as recommended by your health care provider. This may involve avoiding foods and drinks such as: Coffee and tea, with or without caffeine. Drinks that contain alcohol. Energy drinks and sports drinks. Carbonated drinks or sodas. Chocolate and cocoa. Peppermint and mint flavorings. Garlic and onions. Horseradish. Spicy and acidic foods, including peppers, chili powder, curry powder, vinegar, hot sauces, and barbecue sauce. Citrus fruit juices and citrus fruits, such as oranges, lemons, and limes. Tomato-based foods, such as red sauce, chili, salsa, and pizza with red sauce. Fried and fatty foods, such as   donuts, french fries, potato chips, and high-fat dressings. High-fat meats, such as hot dogs and fatty cuts of  red and white meats, such as rib eye steak, sausage, ham, and bacon. High-fat dairy items, such as whole milk, butter, and cream cheese. Eat small, frequent meals instead of large meals. Avoid drinking large amounts of liquid with your meals. Avoid eating meals during the 2-3 hours before bedtime. Avoid lying down right after you eat. Do not exercise right after you eat.  Lifestyle  Do not use any products that contain nicotine or tobacco. These products include cigarettes, chewing tobacco, and vaping devices, such as e-cigarettes. If you need help quitting, ask your health care provider. Try to reduce your stress by using methods such as yoga or meditation. If you need help reducing stress, ask your health care provider. If you are overweight, reduce your weight to an amount that is healthy for you. Ask your health care provider for guidance about a safe weight loss goal.  General instructions Pay attention to any changes in your symptoms. Take over-the-counter and prescription medicines only as told by your health care provider. Do not take aspirin, ibuprofen, or other NSAIDs unless your health care provider told you to take these medicines. Wear loose-fitting clothing. Do not wear anything tight around your waist that causes pressure on your abdomen. Raise (elevate) the head of your bed about 6 inches (15 cm). You can use a wedge to do this. Avoid bending over if this makes your symptoms worse. Keep all follow-up visits. This is important. Contact a health care provider if: You have: New symptoms. Unexplained weight loss. Difficulty swallowing or it hurts to swallow. Wheezing or a persistent cough. A hoarse voice. Your symptoms do not improve with treatment. Get help right away if: You have sudden pain in your arms, neck, jaw, teeth, or back. You suddenly feel sweaty, dizzy, or light-headed. You have chest pain or shortness of breath. You vomit and the vomit is green, yellow, or  black, or it looks like blood or coffee grounds. You faint. You have stool that is red, bloody, or black. You cannot swallow, drink, or eat. These symptoms may represent a serious problem that is an emergency. Do not wait to see if the symptoms will go away. Get medical help right away. Call your local emergency services (911 in the [place] Do not drive yourself to the hospital. Summary Gastroesophageal reflux happens when acid from the stomach flows up into the esophagus. GERD is a disease in which the reflux happens often, causes frequent or severe symptoms, or causes problems such as damage to the esophagus. Treatment for this condition may vary depending on how severe your symptoms are. Your health care provider may recommend diet and lifestyle changes, medicine, or surgery. Contact a health care provider if you have new or worsening symptoms. Take over-the-counter and prescription medicines only as told by your health care provider. Do not take aspirin, ibuprofen, or other NSAIDs unless your health care provider told you to do so. Keep all follow-up visits as told by your health care provider. This is important. This information is not intended to replace advice given to you by your health care provider. Make sure you discuss any questions you have with your healthcare provider. Document Revised: 04/03/2020 Document Reviewed: 04/03/2020 Elsevier Patient Education  2022 [employer] Inc.  

## 2021-04-27 NOTE — Progress Notes (Signed)
Acute Office Visit  Subjective:    Patient ID: Bethany Hall, female    DOB: [birth year], 30 y.o.   MRN: [id number]  Chief Complaint  Patient presents with   Nausea   Abdominal Pain    HPI Patient is in today for nausea and vomiting for the last 3 days. The reports vomiting in the morning and then after dinner usually. Also reports vomiting after larger meals. She reports generalized abdominal pain that has been going on for 2-3 weeks. It feels like cramping. The pain is intermittent. She also reports decreased appetite for the last 2-3 weeks as well. She does report heartburn. Denies fever or diarrhea. She has daily BM, but has to use stool softeners to help with straining. She has tried tums with little improvement. She was previously on prilosec which was helpful.   Unsure of LMP as her cycles are irregular. She is on OCPs.   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   GERD (gastroesophageal reflux disease)    occas tums prm   Medical history non-contributory    Neuromuscular disorder (HCC)    carpel tunnel bilateral    Past Surgical History:  Procedure Laterality Date   CESAREAN SECTION  2011,2014,2016,02/2017   x 4   DILATION AND EVACUATION N/A 08/07/2017   Procedure: DILATATION AND EVACUATION WITH ULTRASOUND GUIDANCE ;  Surgeon: Shea Evans, MD;  Location: [facility];  Service: Gynecology;  Laterality: N/A;    Family History  Problem Relation Age of Onset   Diabetes Mother    Heart disease Father    Adrenal disorder Neg Hx     Social History   Socioeconomic History   Marital status: Married    Spouse name: Not on file   Number of children: Not on file   Years of education: Not on file   Highest education level: Not on file  Occupational History   Not on file  Tobacco Use   Smoking status: Never   Smokeless tobacco: Never  Vaping Use    Vaping Use: Never used  Substance and Sexual Activity   Alcohol use: No    Comment: occ   Drug use: No   Sexual activity: Not Currently    Birth control/protection: None  Other Topics Concern   Not on file  Social History Narrative   Not on file   Social Determinants of Health   Financial Resource Strain: Not on file  Food Insecurity: Not on file  Transportation Needs: Not on file  Physical Activity: Not on file  Stress: Not on file  Social Connections: Not on file  Intimate Partner Violence: Not on file    Outpatient Medications Prior to Visit  Medication Sig Dispense Refill   hydrocortisone-pramoxine (PROCTOFOAM HC) rectal foam Place 1 applicator rectally 2 (two) times daily. x7 day per flare. 10 g 2   Norethindrone-Ethinyl Estradiol-Fe Biphas (LO LOESTRIN FE) 1 MG-10 MCG / 10 MCG tablet Take 1 tablet by mouth daily. 28 tablet 11   escitalopram (LEXAPRO) 10 MG tablet Take 1 tablet (10 mg total) by mouth daily. (Patient not taking: Reported on 04/27/2021) 30 tablet 5   witch hazel-glycerin (TUCKS) pad Apply 1 application topically as needed for itching. (Patient not taking: Reported on 04/27/2021) 40 each 12   No facility-administered medications prior to visit.  Allergies  Allergen Reactions   Penicillins Rash    Has patient had a PCN reaction causing immediate rash, facial/tongue/throat swelling, SOB or lightheadedness with hypotension: Unknown Has patient had a PCN reaction causing severe rash involving mucus membranes or skin necrosis: Unknown Has patient had a PCN reaction that required hospitalization: Unknown Has patient had a PCN reaction occurring within the last 10 years: No If all of the above answers are "NO", then may proceed with Cephalosporin use. Infantile Reaction.    Review of Systems As per HPI.     Objective:    Physical Exam Vitals and nursing note reviewed.  Constitutional:      General: She is not in acute distress.    Appearance: She is  not ill-appearing, toxic-appearing or diaphoretic.  Cardiovascular:     Rate and Rhythm: Normal rate and regular rhythm.  Pulmonary:     Effort: Pulmonary effort is normal.     Breath sounds: Normal breath sounds.  Abdominal:     General: Abdomen is flat. Bowel sounds are normal. There is no distension.     Palpations: Abdomen is soft.     Tenderness: There is abdominal tenderness in the epigastric area. There is no guarding or rebound. Negative signs include Murphy's sign, Rovsing's sign and McBurney's sign.     Hernia: No hernia is present.  Skin:    General: Skin is warm and dry.  Neurological:     General: No focal deficit present.     Mental Status: She is alert and oriented to person, place, and time.  Psychiatric:        Mood and Affect: Mood normal.        Behavior: Behavior normal.    BP 118/82   Pulse 89   Temp 98.1 F (36.7 C)   Ht 4\' 11"  (1.499 m)   Wt 123 lb 3.2 oz (55.9 kg)   SpO2 99%   BMI 24.88 kg/m  Wt Readings from Last 3 Encounters:  04/27/21 123 lb 3.2 oz (55.9 kg)  04/19/21 122 lb (55.3 kg)  04/05/21 123 lb 8 oz (56 kg)    Health Maintenance Due  Topic Date Due   TETANUS/TDAP  Never done    There are no preventive care reminders to display for this patient.   Lab Results  Component Value Date   TSH 1.05 01/16/2021   Lab Results  Component Value Date   WBC 8.6 10/20/2018   HGB 14.8 10/20/2018   HCT 44.9 10/20/2018   MCV 94 10/20/2018   PLT 222 10/20/2018   Lab Results  Component Value Date   NA 137 11/29/2020   K 4.2 11/29/2020   CO2 19 (L) 11/29/2020   GLUCOSE 92 11/29/2020   BUN 10 11/29/2020   CREATININE 0.63 11/29/2020   BILITOT 0.3 11/29/2020   ALKPHOS 61 11/29/2020   AST 16 11/29/2020   ALT 12 11/29/2020   PROT 7.1 11/29/2020   ALBUMIN 4.2 11/29/2020   CALCIUM 10.0 11/29/2020   No results found for: CHOL No results found for: HDL No results found for: LDLCALC No results found for: TRIG No results found for:  CHOLHDL No results found for: 12/01/2020     Assessment & Plan:   Bethany Hall was seen today for nausea and abdominal pain.  Diagnoses and all orders for this visit:  Gastroesophageal reflux disease without esophagitis Uncontrolled. Start prilosec as below. Pepcid BID prn. Avoid fried, greasy, spicy or acidic foods. Small frequent meals. Handout given.   -  omeprazole (PRILOSEC) 20 MG capsule; Take 1 capsule (20 mg total) by mouth daily. -     famotidine (PEPCID) 20 MG tablet; Take 1 tablet (20 mg total) by mouth 2 (two) times daily.  Intractable vomiting with nausea, unspecified vomiting type Negative urine. Discussed could be due to uncontrolled GERD. Zofran from. Small frequent bland meals.  -     Pregnancy, urine -     ondansetron (ZOFRAN ODT) 8 MG disintegrating tablet; Take 1 tablet (8 mg total) by mouth every 8 (eight) hours as needed for nausea or vomiting.  Return in about 4 weeks (around 05/25/2021) for follow up with PCP. Sooner if needed.   The patient indicates understanding of these issues and agrees with the plan.  Gabriel Earing, FNP

## 2021-04-30 ENCOUNTER — Encounter: Payer: Self-pay | Admitting: Family Medicine

## 2021-04-30 ENCOUNTER — Other Ambulatory Visit: Payer: Self-pay

## 2021-04-30 ENCOUNTER — Ambulatory Visit (INDEPENDENT_AMBULATORY_CARE_PROVIDER_SITE_OTHER): Payer: BC Managed Care – PPO

## 2021-04-30 ENCOUNTER — Ambulatory Visit: Payer: BC Managed Care – PPO | Admitting: Family Medicine

## 2021-04-30 VITALS — BP 112/66 | HR 80 | Temp 98.0°F | Resp 20 | Ht 59.0 in | Wt 119.0 lb

## 2021-04-30 DIAGNOSIS — R197 Diarrhea, unspecified: Secondary | ICD-10-CM | POA: Diagnosis not present

## 2021-04-30 DIAGNOSIS — R112 Nausea with vomiting, unspecified: Secondary | ICD-10-CM | POA: Diagnosis not present

## 2021-04-30 DIAGNOSIS — R1030 Lower abdominal pain, unspecified: Secondary | ICD-10-CM

## 2021-04-30 LAB — MICROSCOPIC EXAMINATION: WBC, UA: NONE SEEN /hpf (ref 0–5)

## 2021-04-30 LAB — URINALYSIS, ROUTINE W REFLEX MICROSCOPIC
Glucose, UA: NEGATIVE
Leukocytes,UA: NEGATIVE
Nitrite, UA: NEGATIVE
Protein,UA: NEGATIVE
Specific Gravity, UA: >1.03 — ABNORMAL HIGH (ref 1.005–1.030)
Urobilinogen, Ur: 0.2 mg/dL (ref 0.2–1.0)
pH, UA: 5.5 (ref 5.0–7.5)

## 2021-04-30 MED ORDER — PROMETHAZINE HCL 12.5 MG PO TABS: 12.5000 mg | ORAL_TABLET | Freq: Three times a day (TID) | ORAL | 0 refills | Status: DC | PRN

## 2021-04-30 NOTE — Patient Instructions (Signed)

## 2021-04-30 NOTE — Progress Notes (Signed)
Acute Office Visit  Subjective:    Patient ID: Bethany Hall, female    DOB: Oct 20, 1990, 30 y.o.   MRN: [id number]  Chief Complaint  Patient presents with   Abdominal Pain    HPI Patient is in today for abdominal pain x 6 days. Pain is now lower. Bethany Hall reports intermittent lower abdominal cramping. Pain is on both sides. She has been vomiting more than before. She reports that since last Hall she has been unable to keep down fluids. She has not had much to eat. She feels bloated after eating. She has been taking zofran without improvement. She also reports some diarrhea. She has had 3-4 episodes of water diarrhea since last Hall. Denies fever or chills. She also reports some discomfort after urinating. Denies other urinary symptoms. Denies vaginal symptoms.  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   GERD (gastroesophageal reflux disease)    occas tums prm   Medical history non-contributory    Neuromuscular disorder ([place])    carpel tunnel bilateral    Past Surgical History:  Procedure Laterality Date   CESAREAN SECTION  2011,2014,2016,02/2017   x 4   DILATION AND EVACUATION N/A 08/07/2017   Procedure: DILATATION AND EVACUATION WITH ULTRASOUND GUIDANCE ;  Surgeon: Azucena Fallen, MD;  Location: [facility];  Service: Gynecology;  Laterality: N/A;    Family History  Problem Relation Age of Onset   Diabetes Mother    Heart disease Father    Adrenal disorder Neg Hx     Social History   Socioeconomic History   Marital status: Married    Spouse name: Not on file   Number of children: Not on file   Years of education: Not on file   Highest education level: Not on file  Occupational History   Not on file  Tobacco Use   Smoking status: Never   Smokeless tobacco: Never  Vaping Use   Vaping Use: Never used  Substance and Sexual Activity   Alcohol use: No     Comment: occ   Drug use: No   Sexual activity: Not Currently    Birth control/protection: None  Other Topics Concern   Not on file  Social History Narrative   Not on file   Social Determinants of Health   Financial Resource Strain: Not on file  Food Insecurity: Not on file  Transportation Needs: Not on file  Physical Activity: Not on file  Stress: Not on file  Social Connections: Not on file  Intimate Partner Violence: Not on file    Outpatient Medications Prior to Visit  Medication Sig Dispense Refill   escitalopram (LEXAPRO) 10 MG tablet Take 1 tablet (10 mg total) by mouth daily. 30 tablet 5   famotidine (PEPCID) 20 MG tablet Take 1 tablet (20 mg total) by mouth 2 (two) times daily. 180 tablet 0   hydrocortisone-pramoxine (PROCTOFOAM HC) rectal foam Place 1 applicator rectally 2 (two) times daily. x7 day per flare. 10 g 2   Norethindrone-Ethinyl Estradiol-Fe Biphas (LO LOESTRIN FE) 1 MG-10 MCG / 10 MCG tablet Take 1 tablet by mouth daily. 28 tablet 11   omeprazole (PRILOSEC) 20 MG capsule Take 1 capsule (20 mg total) by mouth daily. 90 capsule 0   ondansetron (ZOFRAN ODT) 8 MG disintegrating tablet Take 1 tablet (8 mg total) by mouth  every 8 (eight) hours as needed for nausea or vomiting. 20 tablet 0   witch hazel-glycerin (TUCKS) pad Apply 1 application topically as needed for itching. 40 each 12   No facility-administered medications prior to visit.    Allergies  Allergen Reactions   Penicillins Rash    Has patient had a PCN reaction causing immediate rash, facial/tongue/throat swelling, SOB or lightheadedness with hypotension: Unknown Has patient had a PCN reaction causing severe rash involving mucus membranes or skin necrosis: Unknown Has patient had a PCN reaction that required hospitalization: Unknown Has patient had a PCN reaction occurring within the last 10 years: No If all of the above answers are "NO", then may proceed with Cephalosporin use. Infantile  Reaction.    Review of Systems Negative unless specially indicated above in HPI.    Objective:    Physical Exam Vitals and nursing note reviewed.  Constitutional:      General: She is not in acute distress.    Appearance: She is not toxic-appearing or diaphoretic.  HENT:     Head: Normocephalic and atraumatic.     Mouth/Throat:     Mouth: Mucous membranes are moist.     Pharynx: Oropharynx is clear.  Eyes:     Extraocular Movements: Extraocular movements intact.     Pupils: Pupils are equal, round, and reactive to light.  Cardiovascular:     Rate and Rhythm: Normal rate and regular rhythm.     Heart sounds: Normal heart sounds. No murmur heard. Pulmonary:     Effort: Pulmonary effort is normal. No respiratory distress.     Breath sounds: Normal breath sounds.  Abdominal:     General: There is no distension.     Palpations: Abdomen is soft. There is no shifting dullness, fluid wave, hepatomegaly, splenomegaly or mass.     Tenderness: There is abdominal tenderness in the right lower quadrant, suprapubic area and left lower quadrant. There is no right CVA tenderness, left CVA tenderness, guarding or rebound. Negative signs include Murphy's sign, Rovsing's sign, McBurney's sign and psoas sign.     Hernia: No hernia is present.  Skin:    General: Skin is warm and dry.  Neurological:     General: No focal deficit present.     Mental Status: She is alert and oriented to person, place, and time.    BP 112/66   Pulse 80   Temp 98 F (36.7 C) (Temporal)   Resp 20   Ht 4' 11" (1.499 m)   Wt 119 lb (54 kg)   SpO2 97%   BMI 24.04 kg/m  Wt Readings from Last 3 Encounters:  04/30/21 119 lb (54 kg)  04/27/21 123 lb 3.2 oz (55.9 kg)  04/19/21 122 lb (55.3 kg)    Health Maintenance Due  Topic Date Due   TETANUS/TDAP  Never done    There are no preventive care reminders to display for this patient.   Lab Results  Component Value Date   TSH 1.05 01/16/2021   Lab Results   Component Value Date   WBC 8.6 10/20/2018   HGB 14.8 10/20/2018   HCT 44.9 10/20/2018   MCV 94 10/20/2018   PLT 222 10/20/2018   Lab Results  Component Value Date   NA 137 11/29/2020   K 4.2 11/29/2020   CO2 19 (L) 11/29/2020   GLUCOSE 92 11/29/2020   BUN 10 11/29/2020   CREATININE 0.63 11/29/2020   BILITOT 0.3 11/29/2020   ALKPHOS 61 11/29/2020  AST 16 11/29/2020   ALT 12 11/29/2020   PROT 7.1 11/29/2020   ALBUMIN 4.2 11/29/2020   CALCIUM 10.0 11/29/2020   No results found for: CHOL No results found for: HDL No results found for: LDLCALC No results found for: TRIG No results found for: CHOLHDL No results found for: HGBA1C     Assessment & Plan:   Shatara was seen today for abdominal pain.  Diagnoses and all orders for this visit:  Intractable vomiting with nausea, unspecified vomiting type -     DG Abd 1 View -     CBC with Differential/Platelet -     CMP14+EGFR -     Lipase -     promethazine (PHENERGAN) 12.5 MG tablet; Take 1-2 tablets (12.5-25 mg total) by mouth every 8 (eight) hours as needed for nausea or vomiting.  Lower abdominal pain -     DG Abd 1 View -     Urinalysis, Routine w reflex microscopic -     CBC with Differential/Platelet -     CMP14+EGFR -     Lipase  Diarrhea, unspecified type  Neg Xray today in office. Mild RLQ, LLQ, and suprapubic tenderness on exam. UA negative for infection today. Labs pending. Phenergan for nausea/vomiting. Imodium for diarrhea. May need CT if no improvement in symptoms. Discussed strict return precautions. If unable to keep down fluids over next 24 hours, discussed ED evaluation.   Return to office for new or worsening symptoms, or if symptoms persist.   The patient indicates understanding of these issues and agrees with the plan.   Gwenlyn Perking, FNP

## 2021-05-01 ENCOUNTER — Encounter (HOSPITAL_COMMUNITY): Payer: Self-pay

## 2021-05-01 ENCOUNTER — Emergency Department (HOSPITAL_COMMUNITY)
Admission: EM | Admit: 2021-05-01 | Discharge: 2021-05-01 | Disposition: A | Discharge status: Home / Self Care | Payer: BC Managed Care – PPO | Attending: Emergency Medicine | Admitting: Emergency Medicine

## 2021-05-01 ENCOUNTER — Emergency Department (HOSPITAL_COMMUNITY): Payer: BC Managed Care – PPO

## 2021-05-01 DIAGNOSIS — K529 Noninfective gastroenteritis and colitis, unspecified: Secondary | ICD-10-CM | POA: Diagnosis not present

## 2021-05-01 DIAGNOSIS — R102 Pelvic and perineal pain: Secondary | ICD-10-CM | POA: Diagnosis not present

## 2021-05-01 DIAGNOSIS — R112 Nausea with vomiting, unspecified: Secondary | ICD-10-CM

## 2021-05-01 DIAGNOSIS — R197 Diarrhea, unspecified: Secondary | ICD-10-CM

## 2021-05-01 LAB — URINALYSIS, ROUTINE W REFLEX MICROSCOPIC
Bacteria, UA: NONE SEEN
Bilirubin Urine: NEGATIVE
Glucose, UA: 50 mg/dL — AB
Ketones, ur: 20 mg/dL — AB
Leukocytes,Ua: NEGATIVE
Nitrite: NEGATIVE
Protein, ur: 100 mg/dL — AB
RBC / HPF: >50 RBC/hpf — ABNORMAL HIGH (ref 0–5)
Specific Gravity, Urine: 1.027 (ref 1.005–1.030)
WBC, UA: >50 WBC/hpf — ABNORMAL HIGH (ref 0–5)
pH: 5 (ref 5.0–8.0)

## 2021-05-01 LAB — CBC WITH DIFFERENTIAL/PLATELET
Abs Immature Granulocytes: 0.03 10*3/uL (ref 0.00–0.07)
Basophils Absolute: 0.1 10*3/uL (ref 0.0–0.2)
Basophils Absolute: 0 10*3/uL (ref 0.0–0.1)
Basophils Relative: 0 %
Basos: 1 %
EOS (ABSOLUTE): 0 10*3/uL (ref 0.0–0.4)
Eos: 0 %
Eosinophils Absolute: 0.1 10*3/uL (ref 0.0–0.5)
Eosinophils Relative: 1 %
HCT: 41.7 % (ref 36.0–46.0)
Hematocrit: 38.4 % (ref 34.0–46.6)
Hemoglobin: 11.9 g/dL (ref 11.1–15.9)
Hemoglobin: 12.7 g/dL (ref 12.0–15.0)
Immature Grans (Abs): 0 10*3/uL (ref 0.0–0.1)
Immature Granulocytes: 0 %
Immature Granulocytes: 0 %
Lymphocytes Absolute: 1.3 10*3/uL (ref 0.7–3.1)
Lymphocytes Relative: 13 %
Lymphs Abs: 1.4 10*3/uL (ref 0.7–4.0)
Lymphs: 15 %
MCH: 24.5 pg — ABNORMAL LOW (ref 26.6–33.0)
MCH: 24.6 pg — ABNORMAL LOW (ref 26.0–34.0)
MCHC: 31 g/dL — ABNORMAL LOW (ref 31.5–35.7)
MCHC: 30.5 g/dL (ref 30.0–36.0)
MCV: 79 fL (ref 79–97)
MCV: 80.7 fL (ref 80.0–100.0)
Monocytes Absolute: 0.9 10*3/uL (ref 0.1–0.9)
Monocytes Absolute: 1.1 10*3/uL — ABNORMAL HIGH (ref 0.1–1.0)
Monocytes Relative: 11 %
Monocytes: 11 %
Neutro Abs: 7.8 10*3/uL — ABNORMAL HIGH (ref 1.7–7.7)
Neutrophils Absolute: 6.2 10*3/uL (ref 1.4–7.0)
Neutrophils Relative %: 75 %
Neutrophils: 73 %
Platelets: 302 10*3/uL (ref 150–450)
Platelets: 293 10*3/uL (ref 150–400)
RBC: 4.85 x10E6/uL (ref 3.77–5.28)
RBC: 5.17 MIL/uL — ABNORMAL HIGH (ref 3.87–5.11)
RDW: 15.4 % (ref 11.7–15.4)
RDW: 16.5 % — ABNORMAL HIGH (ref 11.5–15.5)
WBC: 8.5 10*3/uL (ref 3.4–10.8)
WBC: 10.3 10*3/uL (ref 4.0–10.5)
nRBC: 0 % (ref 0.0–0.2)

## 2021-05-01 LAB — CMP14+EGFR
ALT: 12 IU/L (ref 0–32)
AST: 17 IU/L (ref 0–40)
Albumin/Globulin Ratio: 1.4 (ref 1.2–2.2)
Albumin: 4 g/dL (ref 3.8–4.8)
Alkaline Phosphatase: 61 IU/L (ref 44–121)
BUN/Creatinine Ratio: 14 (ref 9–23)
BUN: 10 mg/dL (ref 6–20)
Bilirubin Total: 0.2 mg/dL (ref 0.0–1.2)
CO2: 19 mmol/L — ABNORMAL LOW (ref 20–29)
Calcium: 9.5 mg/dL (ref 8.7–10.2)
Chloride: 106 mmol/L (ref 96–106)
Creatinine, Ser: 0.74 mg/dL (ref 0.57–1.00)
Globulin, Total: 2.8 g/dL (ref 1.5–4.5)
Glucose: 78 mg/dL (ref 65–99)
Potassium: 4.3 mmol/L (ref 3.5–5.2)
Sodium: 139 mmol/L (ref 134–144)
Total Protein: 6.8 g/dL (ref 6.0–8.5)
eGFR: 110 mL/min/{1.73_m2} (ref >59)

## 2021-05-01 LAB — COMPREHENSIVE METABOLIC PANEL
ALT: 15 U/L (ref 0–44)
AST: 18 U/L (ref 15–41)
Albumin: 3.7 g/dL (ref 3.5–5.0)
Alkaline Phosphatase: 51 U/L (ref 38–126)
Anion gap: 6 (ref 5–15)
BUN: 8 mg/dL (ref 6–20)
CO2: 24 mmol/L (ref 22–32)
Calcium: 9.2 mg/dL (ref 8.9–10.3)
Chloride: 105 mmol/L (ref 98–111)
Creatinine, Ser: 0.71 mg/dL (ref 0.44–1.00)
GFR, Estimated: >60 mL/min (ref >60)
Glucose, Bld: 83 mg/dL (ref 70–99)
Potassium: 3.9 mmol/L (ref 3.5–5.1)
Sodium: 135 mmol/L (ref 135–145)
Total Bilirubin: 0.5 mg/dL (ref 0.3–1.2)
Total Protein: 7.4 g/dL (ref 6.5–8.1)

## 2021-05-01 LAB — LIPASE: Lipase: 22 U/L (ref 14–72)

## 2021-05-01 LAB — LIPASE, BLOOD: Lipase: 29 U/L (ref 11–51)

## 2021-05-01 LAB — HCG, SERUM, QUALITATIVE: Preg, Serum: NEGATIVE

## 2021-05-01 MED ORDER — IOHEXOL 300 MG/ML  SOLN
100.0000 mL | Freq: Once | INTRAMUSCULAR | Status: AC | PRN
  Administered 2021-05-01: 100 mL via INTRAVENOUS

## 2021-05-01 MED ORDER — METRONIDAZOLE 500 MG PO TABS: 500.0000 mg | ORAL_TABLET | Freq: Two times a day (BID) | ORAL | 0 refills | Status: DC

## 2021-05-01 MED ORDER — SODIUM CHLORIDE 0.9 % IV SOLN
25.0000 mg | Freq: Four times a day (QID) | INTRAVENOUS | Status: DC | PRN
  Administered 2021-05-01: 25 mg via INTRAVENOUS
  Filled 2021-05-01: qty 1

## 2021-05-01 MED ORDER — HYDROMORPHONE HCL 1 MG/ML IJ SOLN: 0.5000 mg | Freq: Once | INTRAMUSCULAR | Status: DC

## 2021-05-01 MED ORDER — CIPROFLOXACIN HCL 500 MG PO TABS: 500.0000 mg | ORAL_TABLET | Freq: Two times a day (BID) | ORAL | 0 refills | Status: DC

## 2021-05-01 MED ORDER — SODIUM CHLORIDE 0.9 % IV BOLUS
1000.0000 mL | Freq: Once | INTRAVENOUS | Status: AC
Start: 2021-05-01 — End: 2021-05-01
  Administered 2021-05-01: 1000 mL via INTRAVENOUS

## 2021-05-01 MED ORDER — PROMETHAZINE HCL 25 MG PO TABS: 25.0000 mg | ORAL_TABLET | Freq: Four times a day (QID) | ORAL | 0 refills | Status: DC | PRN

## 2021-05-01 MED ORDER — MORPHINE SULFATE (PF) 4 MG/ML IV SOLN
4.0000 mg | Freq: Once | INTRAVENOUS | Status: AC
  Administered 2021-05-01: 4 mg via INTRAVENOUS
  Filled 2021-05-01: qty 1

## 2021-05-01 MED ORDER — ONDANSETRON HCL 4 MG/2ML IJ SOLN
4.0000 mg | Freq: Once | INTRAMUSCULAR | Status: AC
  Administered 2021-05-01: 4 mg via INTRAVENOUS
  Filled 2021-05-01: qty 2

## 2021-05-01 MED ORDER — DICYCLOMINE HCL 20 MG PO TABS: 20.0000 mg | ORAL_TABLET | Freq: Two times a day (BID) | ORAL | 0 refills | Status: DC

## 2021-05-01 NOTE — ED Triage Notes (Signed)
Pt doctor told her to come her for her nausea, vomiting and feeling fullness she has experienced over the last few weeks.  Pt alert and oriented, skin warm and dry, pt has experienced weight loss from the illness.  Pt reports she needs a CT with contrast

## 2021-05-01 NOTE — ED Provider Notes (Signed)
Shared service with APP.  I have personally seen and examined the patient, providing direct face to face care.  Physical exam findings and plan include persistent abdominal pain, sent to ED for further imaging/ assessment.  Difficult IV, US guided by myself right AC.  Ultrasound ED Peripheral IV (Provider)  Date/Time: 05/01/2021 11:18 AM Performed by: Blane Ohara, MD Authorized by: Blane Ohara, MD   Procedure details:    Indications: multiple failed IV attempts     Skin Prep: chlorhexidine gluconate     Location:  Right AC   Angiocath:  18 G   Bedside Ultrasound Guided: Yes     Images: archived     Patient tolerated procedure without complications: Yes     Dressing applied: Yes     No diagnosis found.     Blane Ohara, MD 05/09/21 1524

## 2021-05-01 NOTE — ED Notes (Signed)
This NT gave Pt water, Pt had no issues

## 2021-05-01 NOTE — Discharge Instructions (Addendum)
Take the medications as prescribed  I have placed a referral to the GI doctors.  Return for new or worsening symptoms

## 2021-05-01 NOTE — ED Provider Notes (Signed)
[facility] EMERGENCY DEPARTMENT Provider Note   CSN: [id number] Arrival date & time: 05/01/21  0908     History Chief Complaint  Patient presents with   Abdominal Pain   Nausea    Bethany Hall is a 30 y.o. female with no significant past medical history who presents for evaluation of pain.  Has had abdominal fullness x1 month.  Lack of appetite as well as NBNB emesis, loose stools over last 2 weeks.  No recent antibiotics or travel.  Initially thought her pain was reflux, taking meds at home.  Seen by PCP yesterday.  She has not filled the medications provided by PCP.  No melena or blood per rectum.  No history of IBD.  Was recently checked for STDs within the last 2 weeks when her symptoms started.  She has no pelvic pain, vaginal discharge.  Did start a new birth control prior to symptoms beginning.  No fever, chills, congestion, rhinorrhea, cough, back pain, abnormal vaginal bleeding.  Denies additional aggravating or alleviating factors. Rates pain a 6/10. Denies marijuana use.  History obtained from patient and past medical records.  No interpreter used.      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   GERD (gastroesophageal reflux disease)    occas tums prm   Medical history non-contributory    Neuromuscular disorder (HCC)    carpel tunnel bilateral    Patient Active Problem List   Diagnosis Date Noted   Hyperpigmentation 01/16/2021   Degenerative disc disease, cervical 10/20/2018   Chronic bilateral thoracic back pain 10/20/2018   Musculoskeletal pain of right upper extremity 10/20/2018   Chronically dry eyes, bilateral 10/20/2018   Dry mouth 10/20/2018    Past Surgical History:  Procedure Laterality Date   CESAREAN SECTION  2011,2014,2016,02/2017   x 4   DILATION AND EVACUATION N/A 08/07/2017   Procedure: DILATATION AND EVACUATION  WITH ULTRASOUND GUIDANCE ;  Surgeon: Shea Evans, MD;  Location: [facility];  Service: Gynecology;  Laterality: N/A;     OB History     Gravida  5   Para  4   Term  3   Preterm  1   AB  0   Living  4      SAB  0   IAB  0   Ectopic  0   Multiple  0   Live Births  4           Family History  Problem Relation Age of Onset   Diabetes Mother    Heart disease Father    Adrenal disorder Neg Hx     Social History   Tobacco Use   Smoking status: Never   Smokeless tobacco: Never  Vaping Use   Vaping Use: Never used  Substance Use Topics   Alcohol use: No    Comment: occ   Drug use: No    Home Medications Prior to Admission medications   Medication Sig Start Date End Date Taking? Authorizing Provider  ciprofloxacin (CIPRO) 500 MG tablet Take 1 tablet (500 mg total) by mouth every 12 (twelve) hours. 05/01/21  Yes Tay Whitwell A, PA-C  dicyclomine (BENTYL) 20 MG tablet Take 1 tablet (20 mg total) by mouth 2 (two) times daily. 05/01/21  Yes Kyrsten Deleeuw A, PA-C  metroNIDAZOLE (FLAGYL)  500 MG tablet Take 1 tablet (500 mg total) by mouth 2 (two) times daily. 05/01/21  Yes Quincy Prisco A, PA-C  promethazine (PHENERGAN) 25 MG tablet Take 1 tablet (25 mg total) by mouth every 6 (six) hours as needed for nausea or vomiting. 05/01/21  Yes Haidee Stogsdill A, PA-C  escitalopram (LEXAPRO) 10 MG tablet Take 1 tablet (10 mg total) by mouth daily. 04/19/21   Gabriel Earing, FNP  famotidine (PEPCID) 20 MG tablet Take 1 tablet (20 mg total) by mouth 2 (two) times daily. 04/27/21   Gabriel Earing, FNP  hydrocortisone-pramoxine (PROCTOFOAM [facility]) rectal foam Place 1 applicator rectally 2 (two) times daily. x7 day per flare. 04/05/21   Gabriel Earing, FNP  Norethindrone-Ethinyl Estradiol-Fe Biphas (LO LOESTRIN FE) 1 MG-10 MCG / 10 MCG tablet Take 1 tablet by mouth daily. 01/12/21   Gabriel Earing, FNP  omeprazole (PRILOSEC) 20 MG capsule Take 1 capsule (20 mg total) by  mouth daily. 04/27/21   Gabriel Earing, FNP  ondansetron (ZOFRAN ODT) 8 MG disintegrating tablet Take 1 tablet (8 mg total) by mouth every 8 (eight) hours as needed for nausea or vomiting. 04/27/21   Gabriel Earing, FNP  witch hazel-glycerin (TUCKS) pad Apply 1 application topically as needed for itching. 04/19/21   Gabriel Earing, FNP    Allergies    Penicillins  Review of Systems   Review of Systems  Constitutional: Negative.   HENT: Negative.    Respiratory: Negative.    Cardiovascular: Negative.   Gastrointestinal:  Positive for abdominal pain, diarrhea, nausea and vomiting. Negative for abdominal distention, anal bleeding, blood in stool, constipation and rectal pain.  Genitourinary: Negative.   Musculoskeletal: Negative.   Skin: Negative.   Neurological: Negative.   All other systems reviewed and are negative.  Physical Exam Updated Vital Signs BP 112/63   Pulse 91   Temp 98.2 F (36.8 C) (Oral)   Resp (!) 24   Ht 4\' 11"  (1.499 m)   Wt 54 kg   LMP 05/01/2021   SpO2 98%   BMI 24.04 kg/m   Physical Exam Vitals and nursing note reviewed.  Constitutional:      General: She is not in acute distress.    Appearance: She is well-developed. She is not ill-appearing, toxic-appearing or diaphoretic.  HENT:     Head: Normocephalic and atraumatic.     Mouth/Throat:     Mouth: Mucous membranes are moist.  Eyes:     Pupils: Pupils are equal, round, and reactive to light.  Cardiovascular:     Rate and Rhythm: Normal rate.     Heart sounds: Normal heart sounds.  Pulmonary:     Effort: Pulmonary effort is normal. No respiratory distress.     Breath sounds: Normal breath sounds.  Abdominal:     General: Bowel sounds are normal. There is no distension.     Palpations: Abdomen is soft.     Tenderness: There is generalized abdominal tenderness. There is no right CVA tenderness, left CVA tenderness, guarding or rebound. Negative signs include Murphy's sign and McBurney's  sign.     Hernia: No hernia is present.  Musculoskeletal:        General: Normal range of motion.     Cervical back: Normal range of motion.  Skin:    General: Skin is warm and dry.     Capillary Refill: Capillary refill takes less than 2 seconds.  Neurological:     General: No focal  deficit present.     Mental Status: She is alert.  Psychiatric:        Mood and Affect: Mood normal.   ED Results / Procedures / Treatments   Labs (all labs ordered are listed, but only abnormal results are displayed) Labs Reviewed  CBC WITH DIFFERENTIAL/PLATELET - Abnormal; Notable for the following components:      Result Value   RBC 5.17 (*)    MCH 24.6 (*)    RDW 16.5 (*)    Neutro Abs 7.8 (*)    Monocytes Absolute 1.1 (*)    All other components within normal limits  URINALYSIS, ROUTINE W REFLEX MICROSCOPIC - Abnormal; Notable for the following components:   Color, Urine AMBER (*)    APPearance CLOUDY (*)    Glucose, UA 50 (*)    Hgb urine dipstick LARGE (*)    Ketones, ur 20 (*)    Protein, ur 100 (*)    RBC / HPF >50 (*)    WBC, UA >50 (*)    All other components within normal limits  COMPREHENSIVE METABOLIC PANEL  LIPASE, BLOOD  HCG, SERUM, QUALITATIVE    EKG None  Radiology DG Abd 1 View  Result Date: 04/30/2021 CLINICAL DATA:  Abdominal pain EXAM: ABDOMEN - 1 VIEW COMPARISON:  None. FINDINGS: The bowel gas pattern is normal. No radio-opaque calculi or other significant radiographic abnormality are seen. IMPRESSION: Negative. Electronically Signed   By: Signa Kell M.D.   On: 04/30/2021 10:08   CT Abdomen Pelvis W Contrast  Result Date: 05/01/2021 CLINICAL DATA:  Acute non localized abdominal pain, diarrhea and emesis for the past 2 weeks. EXAM: CT ABDOMEN AND PELVIS WITH CONTRAST TECHNIQUE: Multidetector CT imaging of the abdomen and pelvis was performed using the standard protocol following bolus administration of intravenous contrast. CONTRAST:  OMNIPAQUE IOHEXOL  300 MG/ML  SOLN COMPARISON:  None. FINDINGS: Lower chest: Limited visualization of the lower thorax is negative for focal airspace opacity or pleural effusion. Normal heart size. No pericardial effusion. Hepatobiliary: Normal hepatic contour. There is an approximately 2.5 x 1.9 x 1.9 cm hypoattenuating lesion within the dome of the right lobe of the liver which cannot be characterized as a simple hepatic cyst (37 Hounsfield units). Scattered additional punctate subcentimeter hypoattenuating hepatic lesions are too small to accurately characterize though favored to represent hepatic cysts. The gallbladder is not identified, presumably surgically absent. No intra or extrahepatic biliary ductal dilatation. No ascites. Pancreas: Normal appearance of the pancreas. Spleen: Normal appearance of the spleen. Adrenals/Urinary Tract: There is symmetric enhancement of the bilateral kidneys. No evidence of nephrolithiasis on this postcontrast examination. No discrete renal lesions. No urinary obstruction or perinephric stranding. Normal appearance the bilateral adrenal glands. The urinary bladder is underdistended. Stomach/Bowel: There is mild fluid distension and potential bowel wall thickening of several loops of small bowel centered within the left lower abdomen/pelvis (representative image 46, series 2) without evidence of enteric obstruction or adjacent mesenteric stranding, potentially physiologic though conceivably a localized enteritis could have a similar appearance. The bowel is otherwise normal in course and caliber without discrete area of wall thickening. Normal appearance of the terminal ileum and the appendix. No pneumoperitoneum, pneumatosis or portal venous gas. No significant hiatal hernia. Vascular/Lymphatic: Normal caliber of the abdominal aorta. The major branch vessels of the abdominal aorta appear patent on this non CTA examination. Scattered mesenteric lymph nodes are prominent though the majority are not  enlarged by size criteria. The majority mesenteric lymph  nodes are centered within the left mid hemiabdomen with index mesenteric lymph node measuring 1.1 cm in greatest short axis diameter (image 28, series 5). Index lymph node within the right lower abdominal quadrant measures 0.6 cm in greatest short axis diameter (image 44, series 2). Reproductive: Note is made of bilateral presumably physiologic bilateral adnexal cysts the left side measures approximately 2.3 x 1.9 cm while the right measures 2.6 x 1.5 cm (both cysts seen on image 58, series 2). Other: Mild diastasis of the rectus abdominal musculature presumably the sequela of provided history of multiple previous Caesarean sections. Musculoskeletal: No acute or aggressive osseous abnormalities. Small limbus body is noted about the anterior inferior aspect of the L4 vertebral body. IMPRESSION: 1. Mild distension and potential bowel wall thickening involving several loops of small bowel within the left mid hemiabdomen with adjacent presumably reactive borderline enlarged mesenteric lymph nodes, nonspecific though could be seen in the setting of a localized enteritis. No evidence of enteric obstruction. 2. Approximately 2.5 cm hepatic lesion, potentially representative of a minimally complex hepatic cysts though incompletely characterized on the present examination. Correlation previous outside examinations (if available), is advised. Otherwise, further evaluation with abdominal MRI could be performed as indicated. 3. Suspected bilateral physiologic adnexal cysts. Electronically Signed   By: Simonne ComeJohn  Watts M.D.   On: 05/01/2021 12:30    Procedures Procedures   Medications Ordered in ED Medications  promethazine (PHENERGAN) 25 mg in sodium chloride 0.9 % 50 mL IVPB (0 mg Intravenous Stopped 05/01/21 1606)  HYDROmorphone (DILAUDID) injection 0.5 mg (has no administration in time range)  sodium chloride 0.9 % bolus 1,000 mL (0 mLs Intravenous Stopped 05/01/21  1231)  ondansetron (ZOFRAN) injection 4 mg (4 mg Intravenous Given 05/01/21 1100)  morphine 4 MG/ML injection 4 mg (4 mg Intravenous Given 05/01/21 1101)  iohexol (OMNIPAQUE) 300 MG/ML solution 100 mL (100 mLs Intravenous Contrast Given 05/01/21 1203)    ED Course  I have reviewed the triage vital signs and the nursing notes.  Pertinent labs & imaging results that were available during my care of the patient were reviewed by me and considered in my medical decision making (see chart for details).  Here for evaluation of abd pain x 2 week with NBNB emesis and diarrhea without melena or BRBPR. Seen by PCP, no abx, travel, MJ use.  Afebrile, nonseptic, not ill-appearing.  Tenderness generally.  No urinary complaints.  Was checked for STDs which were negative at symptom onset.  No known sick contacts, no COVID symptoms.  Plan on labs, imaging and reassess  Labs and imaging personally reviewed and interpreted:  CBC without leukocytosis CMP without electrolyte, renal or liver abnormality Preg negative UA negative however does have ketones,blood Lipase 29 CT AP with likely enteritis  Patient reassessed.  Has had some nausea after the Zofran.  We will try additional antiemetics.  Patient reassessed.  Symptoms improved.  She was given Phenergan with relief of her emesis.  She tolerated p.o. intake.  Given symptoms greater than 2 weeks we will do antibiotics for enteritis, Bentyl, Phenergan p.o. she denies suppositories.  Might possibly need follow-up with GI, stool cultures who was not able to get sample here.  Patient is nontoxic, nonseptic appearing, in no apparent distress.  Patient's pain and other symptoms adequately managed in emergency department.  Fluid bolus given.  Labs, imaging and vitals reviewed.  Patient does not meet the SIRS or Sepsis criteria.  On repeat exam patient does not have a surgical abdomin and  there are no peritoneal signs.  No indication of appendicitis, bowel obstruction,  bowel perforation, cholecystitis, diverticulitis, PID, TOA, torsion or ectopic pregnancy.    The patient has been appropriately medically screened and/or stabilized in the ED. I have low suspicion for any other emergent medical condition which would require further screening, evaluation or treatment in the ED or require inpatient management.  Patient is hemodynamically stable and in no acute distress.  Patient able to ambulate in department prior to ED.  Evaluation does not show acute pathology that would require ongoing or additional emergent interventions while in the emergency department or further inpatient treatment.  I have discussed the diagnosis with the patient and answered all questions.  Pain is been managed while in the emergency department and patient has no further complaints prior to discharge.  Patient is comfortable with plan discussed in room and is stable for discharge at this time.  I have discussed strict return precautions for returning to the emergency department.  Patient was encouraged to follow-up with PCP/specialist refer to at discharge.     MDM Rules/Calculators/A&P                            Final Clinical Impression(s) / ED Diagnoses Final diagnoses:  Enteritis  Nausea vomiting and diarrhea    Rx / DC Orders ED Discharge Orders          Ordered    ciprofloxacin (CIPRO) 500 MG tablet  Every 12 hours        05/01/21 1659    metroNIDAZOLE (FLAGYL) 500 MG tablet  2 times daily        05/01/21 1659    promethazine (PHENERGAN) 25 MG tablet  Every 6 hours PRN        05/01/21 1659    dicyclomine (BENTYL) 20 MG tablet  2 times daily        05/01/21 1659    Ambulatory referral to Gastroenterology        05/01/21 1659             Fani Rotondo A, PA-C 05/01/21 1701    Blane Ohara, MD 05/03/21 1726

## 2021-05-02 ENCOUNTER — Encounter: Payer: Self-pay | Admitting: Internal Medicine

## 2021-05-02 ENCOUNTER — Emergency Department (HOSPITAL_COMMUNITY)
Admission: EM | Admit: 2021-05-02 | Discharge: 2021-05-03 | Disposition: A | Discharge status: Home / Self Care | Payer: BC Managed Care – PPO | Attending: Emergency Medicine | Admitting: Emergency Medicine

## 2021-05-02 ENCOUNTER — Encounter (HOSPITAL_COMMUNITY): Payer: Self-pay | Admitting: *Deleted

## 2021-05-02 ENCOUNTER — Other Ambulatory Visit: Payer: Self-pay

## 2021-05-02 DIAGNOSIS — Z79899 Other long term (current) drug therapy: Secondary | ICD-10-CM | POA: Diagnosis not present

## 2021-05-02 DIAGNOSIS — R109 Unspecified abdominal pain: Secondary | ICD-10-CM | POA: Insufficient documentation

## 2021-05-02 DIAGNOSIS — R112 Nausea with vomiting, unspecified: Secondary | ICD-10-CM | POA: Insufficient documentation

## 2021-05-02 LAB — COMPREHENSIVE METABOLIC PANEL WITH GFR
ALT: 14 U/L (ref 0–44)
AST: 18 U/L (ref 15–41)
Albumin: 3 g/dL — ABNORMAL LOW (ref 3.5–5.0)
Alkaline Phosphatase: 43 U/L (ref 38–126)
Anion gap: 6 (ref 5–15)
BUN: 6 mg/dL (ref 6–20)
CO2: 24 mmol/L (ref 22–32)
Calcium: 8.6 mg/dL — ABNORMAL LOW (ref 8.9–10.3)
Chloride: 108 mmol/L (ref 98–111)
Creatinine, Ser: 0.64 mg/dL (ref 0.44–1.00)
GFR, Estimated: >60 mL/min
Glucose, Bld: 99 mg/dL (ref 70–99)
Potassium: 3.7 mmol/L (ref 3.5–5.1)
Sodium: 138 mmol/L (ref 135–145)
Total Bilirubin: 0.3 mg/dL (ref 0.3–1.2)
Total Protein: 6 g/dL — ABNORMAL LOW (ref 6.5–8.1)

## 2021-05-02 LAB — RAPID URINE DRUG SCREEN, HOSP PERFORMED: Barbiturates: NOT DETECTED

## 2021-05-02 LAB — URINALYSIS, ROUTINE W REFLEX MICROSCOPIC
Bacteria, UA: NONE SEEN
Bilirubin Urine: NEGATIVE
Glucose, UA: NEGATIVE mg/dL
Ketones, ur: 80 mg/dL — AB
Nitrite: NEGATIVE
Protein, ur: 100 mg/dL — AB
RBC / HPF: >50 RBC/hpf — ABNORMAL HIGH (ref 0–5)
Specific Gravity, Urine: 1.024 (ref 1.005–1.030)
pH: 6 (ref 5.0–8.0)

## 2021-05-02 LAB — CBC
HCT: 36.6 % (ref 36.0–46.0)
Hemoglobin: 11.3 g/dL — ABNORMAL LOW (ref 12.0–15.0)
MCH: 24.6 pg — ABNORMAL LOW (ref 26.0–34.0)
MCHC: 30.9 g/dL (ref 30.0–36.0)
MCV: 79.7 fL — ABNORMAL LOW (ref 80.0–100.0)
Platelets: 263 10*3/uL (ref 150–400)
RBC: 4.59 MIL/uL (ref 3.87–5.11)
RDW: 16.5 % — ABNORMAL HIGH (ref 11.5–15.5)
WBC: 8.8 10*3/uL (ref 4.0–10.5)
nRBC: 0 % (ref 0.0–0.2)

## 2021-05-02 LAB — POC URINE PREG, ED: Preg Test, Ur: NEGATIVE

## 2021-05-02 LAB — LIPASE, BLOOD: Lipase: 29 U/L (ref 11–51)

## 2021-05-02 MED ORDER — ONDANSETRON HCL 4 MG/2ML IJ SOLN
4.0000 mg | Freq: Once | INTRAMUSCULAR | Status: AC
  Administered 2021-05-02: 4 mg via INTRAVENOUS
  Filled 2021-05-02: qty 2

## 2021-05-02 MED ORDER — SODIUM CHLORIDE 0.9 % IV BOLUS
1000.0000 mL | Freq: Once | INTRAVENOUS | Status: AC
  Administered 2021-05-02: 1000 mL via INTRAVENOUS

## 2021-05-02 MED ORDER — SODIUM CHLORIDE 0.9 % IV BOLUS
500.0000 mL | Freq: Once | INTRAVENOUS | Status: AC
  Administered 2021-05-02: 500 mL via INTRAVENOUS

## 2021-05-02 MED ORDER — HALOPERIDOL LACTATE 5 MG/ML IJ SOLN
2.0000 mg | Freq: Once | INTRAMUSCULAR | Status: AC
  Administered 2021-05-02: 2 mg via INTRAVENOUS
  Filled 2021-05-02: qty 1

## 2021-05-02 NOTE — ED Notes (Signed)
ambulatory to restroom

## 2021-05-02 NOTE — ED Notes (Signed)
Gave patient water 

## 2021-05-02 NOTE — ED Provider Notes (Signed)
[facility] Provider Note   CSN: [id number] Arrival date & time: 05/02/21  1820     History Chief Complaint  Patient presents with   Emesis    Bethany Hall is a 30 y.o. female.  HPI She presents for evaluation of persistent nausea and vomiting with cramping abdominal pain when she tries to eat or drink.  She states she is having trouble keeping the medicines down which she was given yesterday.  She confirms history of similar problems for 1 month.  She has not yet called to schedule appointment with gastroenterology.  She denies blood in the emesis.  He denies fever, chills, cough or shortness of breath.  She has not had this previously.  There are no other known active modifying factors.    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   GERD (gastroesophageal reflux disease)    occas tums prm   Medical history non-contributory    Neuromuscular disorder (HCC)    carpel tunnel bilateral    Patient Active Problem List   Diagnosis Date Noted   Hyperpigmentation 01/16/2021   Degenerative disc disease, cervical 10/20/2018   Chronic bilateral thoracic back pain 10/20/2018   Musculoskeletal pain of right upper extremity 10/20/2018   Chronically dry eyes, bilateral 10/20/2018   Dry mouth 10/20/2018    Past Surgical History:  Procedure Laterality Date   CESAREAN SECTION  2011,2014,2016,02/2017   x 4   DILATION AND EVACUATION N/A 08/07/2017   Procedure: DILATATION AND EVACUATION WITH ULTRASOUND GUIDANCE ;  Surgeon: Shea EvansMody, Vaishali, MD;  Location: [facility];  Service: Gynecology;  Laterality: N/A;     OB History     Gravida  5   Para  4   Term  3   Preterm  1   AB  0   Living  4      SAB  0   IAB  0   Ectopic  0   Multiple  0   Live Births  4           Family History  Problem Relation Age of Onset   Diabetes Mother     Heart disease Father    Adrenal disorder Neg Hx     Social History   Tobacco Use   Smoking status: Never   Smokeless tobacco: Never  Vaping Use   Vaping Use: Never used  Substance Use Topics   Alcohol use: No    Comment: occ   Drug use: No    Home Medications Prior to Admission medications   Medication Sig Start Date End Date Taking? Authorizing Provider  ciprofloxacin (CIPRO) 500 MG tablet Take 1 tablet (500 mg total) by mouth every 12 (twelve) hours. 05/01/21  Yes Henderly, Britni A, PA-C  dicyclomine (BENTYL) 20 MG tablet Take 1 tablet (20 mg total) by mouth 2 (two) times daily. 05/01/21  Yes Henderly, Britni A, PA-C  escitalopram (LEXAPRO) 10 MG tablet Take 1 tablet (10 mg total) by mouth daily. 04/19/21  Yes Gabriel EaringMorgan, Tiffany M, FNP  famotidine (PEPCID) 20 MG tablet Take 1 tablet (20 mg total) by mouth 2 (two) times daily. 04/27/21  Yes Gabriel EaringMorgan, Tiffany M, FNP  hydrocortisone-pramoxine ([facility]AM [facility]) rectal foam Place 1 applicator rectally 2 (two) times daily. x7 day per flare. 04/05/21  Yes Gabriel EaringMorgan, Tiffany M, FNP  metroNIDAZOLE (FLAGYL) 500 MG tablet Take 1 tablet (500 mg total) by mouth 2 (two) times daily. 05/01/21  Yes Henderly, Britni A, PA-C  Norethindrone-Ethinyl Estradiol-Fe Biphas (LO LOESTRIN FE) 1 MG-10 MCG / 10 MCG tablet Take 1 tablet by mouth daily. 01/12/21  Yes Gabriel Earing, FNP  omeprazole (PRILOSEC) 20 MG capsule Take 1 capsule (20 mg total) by mouth daily. 04/27/21  Yes Gabriel Earing, FNP  ondansetron (ZOFRAN ODT) 8 MG disintegrating tablet Take 1 tablet (8 mg total) by mouth every 8 (eight) hours as needed for nausea or vomiting. 04/27/21  Yes Gabriel Earing, FNP  promethazine (PHENERGAN) 25 MG tablet Take 1 tablet (25 mg total) by mouth every 6 (six) hours as needed for nausea or vomiting. 05/01/21  Yes Henderly, Britni A, PA-C  witch hazel-glycerin (TUCKS) pad Apply 1 application topically as needed for itching. 04/19/21  Yes Gabriel Earing, FNP     Allergies    Penicillins  Review of Systems   Review of Systems  All other systems reviewed and are negative.  Physical Exam Updated Vital Signs BP 116/85   Pulse 90   Temp 98.2 F (36.8 C) (Oral)   Resp 18   Ht 4\' 11"  (1.499 m)   Wt 54 kg   LMP 05/01/2021   SpO2 100%   BMI 24.04 kg/m   Physical Exam Vitals and nursing note reviewed.  Constitutional:      General: She is not in acute distress.    Appearance: She is well-developed. She is not ill-appearing, toxic-appearing or diaphoretic.  HENT:     Head: Normocephalic and atraumatic.     Right Ear: External ear normal.     Left Ear: External ear normal.  Eyes:     Conjunctiva/sclera: Conjunctivae normal.     Pupils: Pupils are equal, round, and reactive to light.  Neck:     Trachea: Phonation normal.  Cardiovascular:     Rate and Rhythm: Normal rate.  Pulmonary:     Effort: Pulmonary effort is normal.  Abdominal:     General: There is no distension.  Musculoskeletal:        General: Normal range of motion.     Cervical back: Normal range of motion and neck supple.  Skin:    General: Skin is warm and dry.  Neurological:     Mental Status: She is alert and oriented to person, place, and time.     Cranial Nerves: No cranial nerve deficit.     Sensory: No sensory deficit.     Motor: No abnormal muscle tone.     Coordination: Coordination normal.  Psychiatric:        Mood and Affect: Mood normal.        Behavior: Behavior normal.        Thought Content: Thought content normal.        Judgment: Judgment normal.    ED Results / Procedures / Treatments   Labs (all labs ordered are listed, but only abnormal results are displayed) Labs Reviewed  COMPREHENSIVE METABOLIC PANEL - Abnormal; Notable for the following components:      Result Value   Calcium 8.6 (*)    Total Protein 6.0 (*)    Albumin 3.0 (*)    All other components within normal limits  CBC - Abnormal; Notable for the following components:    Hemoglobin 11.3 (*)    MCV 79.7 (*)    MCH 24.6 (*)    RDW 16.5 (*)  All other components within normal limits  URINALYSIS, ROUTINE W REFLEX MICROSCOPIC - Abnormal; Notable for the following components:   Color, Urine AMBER (*)    APPearance CLOUDY (*)    Hgb urine dipstick LARGE (*)    Ketones, ur 80 (*)    Protein, ur 100 (*)    Leukocytes,Ua SMALL (*)    RBC / HPF >50 (*)    All other components within normal limits  RAPID URINE DRUG SCREEN, HOSP PERFORMED - Abnormal; Notable for the following components:   Opiates RESULTS UNAVAILABLE DUE TO INTERFERING SUBSTANCE (*)    Cocaine RESULTS UNAVAILABLE DUE TO INTERFERING SUBSTANCE (*)    Benzodiazepines RESULTS UNAVAILABLE DUE TO INTERFERING SUBSTANCE (*)    Amphetamines RESULTS UNAVAILABLE DUE TO INTERFERING SUBSTANCE (*)    Tetrahydrocannabinol RESULTS UNAVAILABLE DUE TO INTERFERING SUBSTANCE (*)    All other components within normal limits  LIPASE, BLOOD  POC URINE PREG, ED    EKG None  Radiology CT Abdomen Pelvis W Contrast  Result Date: 05/01/2021 CLINICAL DATA:  Acute non localized abdominal pain, diarrhea and emesis for the past 2 weeks. EXAM: CT ABDOMEN AND PELVIS WITH CONTRAST TECHNIQUE: Multidetector CT imaging of the abdomen and pelvis was performed using the standard protocol following bolus administration of intravenous contrast. CONTRAST:  OMNIPAQUE IOHEXOL 300 MG/ML  SOLN COMPARISON:  None. FINDINGS: Lower chest: Limited visualization of the lower thorax is negative for focal airspace opacity or pleural effusion. Normal heart size. No pericardial effusion. Hepatobiliary: Normal hepatic contour. There is an approximately 2.5 x 1.9 x 1.9 cm hypoattenuating lesion within the dome of the right lobe of the liver which cannot be characterized as a simple hepatic cyst (37 Hounsfield units). Scattered additional punctate subcentimeter hypoattenuating hepatic lesions are too small to accurately characterize though  favored to represent hepatic cysts. The gallbladder is not identified, presumably surgically absent. No intra or extrahepatic biliary ductal dilatation. No ascites. Pancreas: Normal appearance of the pancreas. Spleen: Normal appearance of the spleen. Adrenals/Urinary Tract: There is symmetric enhancement of the bilateral kidneys. No evidence of nephrolithiasis on this postcontrast examination. No discrete renal lesions. No urinary obstruction or perinephric stranding. Normal appearance the bilateral adrenal glands. The urinary bladder is underdistended. Stomach/Bowel: There is mild fluid distension and potential bowel wall thickening of several loops of small bowel centered within the left lower abdomen/pelvis (representative image 46, series 2) without evidence of enteric obstruction or adjacent mesenteric stranding, potentially physiologic though conceivably a localized enteritis could have a similar appearance. The bowel is otherwise normal in course and caliber without discrete area of wall thickening. Normal appearance of the terminal ileum and the appendix. No pneumoperitoneum, pneumatosis or portal venous gas. No significant hiatal hernia. Vascular/Lymphatic: Normal caliber of the abdominal aorta. The major branch vessels of the abdominal aorta appear patent on this non CTA examination. Scattered mesenteric lymph nodes are prominent though the majority are not enlarged by size criteria. The majority mesenteric lymph nodes are centered within the left mid hemiabdomen with index mesenteric lymph node measuring 1.1 cm in greatest short axis diameter (image 28, series 5). Index lymph node within the right lower abdominal quadrant measures 0.6 cm in greatest short axis diameter (image 44, series 2). Reproductive: Note is made of bilateral presumably physiologic bilateral adnexal cysts the left side measures approximately 2.3 x 1.9 cm while the right measures 2.6 x 1.5 cm (both cysts seen on image 58, series 2).  Other: Mild diastasis of the rectus abdominal musculature presumably  the sequela of provided history of multiple previous Caesarean sections. Musculoskeletal: No acute or aggressive osseous abnormalities. Small limbus body is noted about the anterior inferior aspect of the L4 vertebral body. IMPRESSION: 1. Mild distension and potential bowel wall thickening involving several loops of small bowel within the left mid hemiabdomen with adjacent presumably reactive borderline enlarged mesenteric lymph nodes, nonspecific though could be seen in the setting of a localized enteritis. No evidence of enteric obstruction. 2. Approximately 2.5 cm hepatic lesion, potentially representative of a minimally complex hepatic cysts though incompletely characterized on the present examination. Correlation previous outside examinations (if available), is advised. Otherwise, further evaluation with abdominal MRI could be performed as indicated. 3. Suspected bilateral physiologic adnexal cysts. Electronically Signed   By: Simonne Come M.D.   On: 05/01/2021 12:30    Procedures Procedures   Medications Ordered in ED Medications  sodium chloride 0.9 % bolus 500 mL (500 mLs Intravenous New Bag/Given 05/02/21 2105)  ondansetron (ZOFRAN) injection 4 mg (4 mg Intravenous Given 05/02/21 2104)  haloperidol lactate (HALDOL) injection 2 mg (2 mg Intravenous Given 05/02/21 2303)  sodium chloride 0.9 % bolus 1,000 mL (1,000 mLs Intravenous New Bag/Given 05/02/21 2302)    ED Course  I have reviewed the triage vital signs and the nursing notes.  Pertinent labs & imaging results that were available during my care of the patient were reviewed by me and considered in my medical decision making (see chart for details).    MDM Rules/Calculators/A&P                            Patient Vitals for the past 24 hrs:  BP Temp Temp src Pulse Resp SpO2 Height Weight  05/02/21 2130 116/85 -- -- 90 18 100 % -- --  05/02/21 1828 -- -- -- -- -- --  4\' 11"  (1.499 m) 54 kg  05/02/21 1827 102/72 98.2 F (36.8 C) Oral 95 18 100 % -- --     Medical Decision Making:  This patient is presenting for evaluation of ongoing nausea and vomiting, which does require a range of treatment options, and is a complaint that involves a moderate risk of morbidity and mortality. The differential diagnoses include progressive illness, metabolic disorder, hemodynamic instability. I decided to review old records, and in summary patient with nausea vomiting for 1 month.  Ongoing nausea and vomiting with abdominal cramping.  She is currently being treated for enteritis with antibiotics, and antiemetics.  She has been referred to gastroenterology but has not seen them yet.  I did not require additional historical information from anyone.  Clinical Laboratory Tests Ordered, included CBC, Metabolic panel, and drug screen, lipid . Review indicates normal except calcium low, total protein low, albumin low, hemoglobin low, urine with findings that are nonspecific including hemoglobin, ketones, protein, small amount of leukocytes and red cells.  She does not have urinary tract symptoms.  Urine drug screen is uninterpretable due to a substance in the urine.   Critical Interventions-clinical evaluation, laboratory testing, medication treatment, IV fluids, additional medication, observation and reassessment  After These Interventions, the Patient was reevaluated and was found with persistent nausea vomiting 1 month.  She is being treated symptomatically in an effort to allow her to be managed at home and then follow-up with gastroenterology  CRITICAL CARE-no Performed by: 05/04/21  Nursing Notes Reviewed/ Care Coordinated Applicable Imaging Reviewed Interpretation of Laboratory Data incorporated into ED treatment   Plan  of care to Dr. Judd Lien to evaluate and treat as necessary then consider discharge    Final Clinical Impression(s) / ED Diagnoses Final diagnoses:   Nausea and vomiting, intractability of vomiting not specified, unspecified vomiting type  Abdominal cramping    Rx / DC Orders ED Discharge Orders     None        Mancel Bale, MD 05/02/21 2327

## 2021-05-02 NOTE — ED Notes (Signed)
Failed PO challenge

## 2021-05-02 NOTE — ED Triage Notes (Signed)
Pt states she is unable to keep her antibiotics down that she was prescribed yesterday

## 2021-05-03 ENCOUNTER — Telehealth: Payer: Self-pay

## 2021-05-03 ENCOUNTER — Telehealth: Payer: Self-pay | Admitting: Family Medicine

## 2021-05-03 DIAGNOSIS — R112 Nausea with vomiting, unspecified: Secondary | ICD-10-CM

## 2021-05-03 DIAGNOSIS — R109 Unspecified abdominal pain: Secondary | ICD-10-CM

## 2021-05-03 MED ORDER — ONDANSETRON 8 MG PO TBDP: ORAL_TABLET | ORAL | 0 refills | Status: DC

## 2021-05-03 NOTE — Telephone Encounter (Signed)
Patient was seen by you on 04/30/2021 for abdominal pain.  She went to the ER yesterday and they recommended she see a GI doctor and placed a referral for her.  Her appointment with them is not until September so she contacted the GI office and requested an earlier appointment.  Their office told her that we needed to place the GI referral and they would try to get her in to be seen sooner.

## 2021-05-03 NOTE — Discharge Instructions (Addendum)
Continue Zofran and Bentyl as prescribed.  Clear liquid diet for the next 12 hours, then slowly advance to normal as tolerated.  Follow-up with gastroenterology as scheduled.

## 2021-05-03 NOTE — Telephone Encounter (Signed)
Referral placed.

## 2021-05-03 NOTE — ED Provider Notes (Signed)
  Physical Exam  BP 116/85   Pulse 90   Temp 98.2 F (36.8 C) (Oral)   Resp 18   Ht 4\' 11"  (1.499 m)   Wt 54 kg   LMP 05/01/2021   SpO2 100%   BMI 24.04 kg/m   Physical Exam  ED Course/Procedures     Procedures  MDM  Care assumed from Dr. 05/03/2021 at shift change.  Patient presenting here with nausea, vomiting, and abdominal pain.  She was seen yesterday with similar complaints, however no definitive cause was found.  She has had similar episodes in the past and is awaiting an appointment with GI.  Care signed out to me awaiting p.o. challenge and IV fluids.  Patient appears to be tolerating water and her fluids have been administered.  Her laboratory studies are reassuring.  She has no white count and electrolytes are unremarkable.  At this point, I see no indication for admission or repeat imaging studies.  Patient to be discharged with continued use of Zofran and follow-up with GI as scheduled.       Effie Shy, MD 05/03/21 [phone]

## 2021-05-04 ENCOUNTER — Ambulatory Visit: Payer: BC Managed Care – PPO | Admitting: Family Medicine

## 2021-05-04 ENCOUNTER — Encounter: Payer: Self-pay | Admitting: Family Medicine

## 2021-05-04 ENCOUNTER — Other Ambulatory Visit: Payer: Self-pay

## 2021-05-04 ENCOUNTER — Telehealth: Payer: Self-pay | Admitting: Family Medicine

## 2021-05-04 VITALS — BP 111/84 | HR 90 | Ht 59.0 in | Wt 115.0 lb

## 2021-05-04 DIAGNOSIS — R112 Nausea with vomiting, unspecified: Secondary | ICD-10-CM | POA: Diagnosis not present

## 2021-05-04 DIAGNOSIS — R197 Diarrhea, unspecified: Secondary | ICD-10-CM | POA: Diagnosis not present

## 2021-05-04 DIAGNOSIS — R1084 Generalized abdominal pain: Secondary | ICD-10-CM | POA: Diagnosis not present

## 2021-05-04 MED ORDER — PROMETHAZINE HCL 25 MG RE SUPP: 25.0000 mg | Freq: Four times a day (QID) | RECTAL | 0 refills | Status: DC | PRN

## 2021-05-04 NOTE — Progress Notes (Signed)
Acute Office Visit  Subjective:    Patient ID: Bethany Hall, female    DOB: [birth year], 30 y.o.   MRN: [id number]  Chief Complaint  Patient presents with   Abdominal Pain   Emesis    Wants to be admitted to the hospital since she can not keep solids or liquids down    HPI Patient is in today for abdominal pain, diarrhea, nausea, and vomiting. Symptoms have been going on for about 1 week. They are worsening. She has not been able to keep down fluids or solids. She has tried eating small amounts of bland foods. She reports difficulty keeping down her medications that have been prescribed. She has been seen in the ER twice in the last 3 days. She had an abdominal CT that was consistent with enteritis. She is being treated with bentyl, cipro, and flagyl. She did receive some fluids in the hospital. Labs are reassuring.  A GI referral has been placed. Her appointment is not until September currently. She has been calling to try to get an earlier appointment. Denies fever. She would like to be admitted to the hospital.    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   GERD (gastroesophageal reflux disease)    occas tums prm   Medical history non-contributory    Neuromuscular disorder (HCC)    carpel tunnel bilateral    Past Surgical History:  Procedure Laterality Date   CESAREAN SECTION  2011,2014,2016,02/2017   x 4   DILATION AND EVACUATION N/A 08/07/2017   Procedure: DILATATION AND EVACUATION WITH ULTRASOUND GUIDANCE ;  Surgeon: Shea Evans, MD;  Location: [facility];  Service: Gynecology;  Laterality: N/A;    Family History  Problem Relation Age of Onset   Diabetes Mother    Heart disease Father    Adrenal disorder Neg Hx     Social History   Socioeconomic History   Marital status: Legally Separated    Spouse name: Not on file   Number of children: Not on  file   Years of education: Not on file   Highest education level: Not on file  Occupational History   Not on file  Tobacco Use   Smoking status: Never   Smokeless tobacco: Never  Vaping Use   Vaping Use: Never used  Substance and Sexual Activity   Alcohol use: No    Comment: occ   Drug use: No   Sexual activity: Not Currently    Birth control/protection: None  Other Topics Concern   Not on file  Social History Narrative   Not on file   Social Determinants of Health   Financial Resource Strain: Not on file  Food Insecurity: Not on file  Transportation Needs: Not on file  Physical Activity: Not on file  Stress: Not on file  Social Connections: Not on file  Intimate Partner Violence: Not on file    Outpatient Medications Prior to Visit  Medication Sig Dispense Refill   ciprofloxacin (CIPRO) 500 MG tablet Take 1 tablet (500 mg total) by mouth every 12 (twelve) hours. 10 tablet 0   dicyclomine (BENTYL) 20 MG tablet Take 1 tablet (20 mg total) by mouth 2 (two) times daily. 20 tablet 0   escitalopram (LEXAPRO) 10 MG tablet Take 1 tablet (10 mg total) by mouth daily. 30 tablet 5  famotidine (PEPCID) 20 MG tablet Take 1 tablet (20 mg total) by mouth 2 (two) times daily. 180 tablet 0   hydrocortisone-pramoxine (PROCTOFOAM [facility]) rectal foam Place 1 applicator rectally 2 (two) times daily. x7 day per flare. 10 g 2   metroNIDAZOLE (FLAGYL) 500 MG tablet Take 1 tablet (500 mg total) by mouth 2 (two) times daily. 14 tablet 0   Norethindrone-Ethinyl Estradiol-Fe Biphas (LO LOESTRIN FE) 1 MG-10 MCG / 10 MCG tablet Take 1 tablet by mouth daily. 28 tablet 11   omeprazole (PRILOSEC) 20 MG capsule Take 1 capsule (20 mg total) by mouth daily. 90 capsule 0   ondansetron (ZOFRAN ODT) 8 MG disintegrating tablet 8mg  ODT q4 hours prn nausea 12 tablet 0   promethazine (PHENERGAN) 25 MG tablet Take 1 tablet (25 mg total) by mouth every 6 (six) hours as needed for nausea or vomiting. 30 tablet 0   witch  hazel-glycerin (TUCKS) pad Apply 1 application topically as needed for itching. 40 each 12   No facility-administered medications prior to visit.    Allergies  Allergen Reactions   Penicillins Rash    Has patient had a PCN reaction causing immediate rash, facial/tongue/throat swelling, SOB or lightheadedness with hypotension: Unknown Has patient had a PCN reaction causing severe rash involving mucus membranes or skin necrosis: Unknown Has patient had a PCN reaction that required hospitalization: Unknown Has patient had a PCN reaction occurring within the last 10 years: No If all of the above answers are "NO", then may proceed with Cephalosporin use. Infantile Reaction.    Review of Systems As per HPI.     Objective:    Physical Exam Vitals and nursing note reviewed.  Constitutional:      General: She is not in acute distress.    Appearance: She is not toxic-appearing or diaphoretic.  HENT:     Mouth/Throat:     Mouth: Mucous membranes are moist.  Cardiovascular:     Rate and Rhythm: Normal rate.  Pulmonary:     Effort: Pulmonary effort is normal. No respiratory distress.  Abdominal:     General: There is no distension.     Palpations: Abdomen is soft. There is no shifting dullness, fluid wave or mass.     Tenderness: There is generalized abdominal tenderness. There is no guarding or rebound. Negative signs include Murphy's sign, Rovsing's sign and McBurney's sign.     Hernia: No hernia is present.  Skin:    General: Skin is warm and dry.  Neurological:     General: No focal deficit present.     Mental Status: She is alert and oriented to person, place, and time.  Psychiatric:        Mood and Affect: Mood normal.        Behavior: Behavior normal.    BP 111/84   Pulse 90   Ht 4\' 11"  (1.499 m)   Wt 115 lb (52.2 kg)   LMP 05/01/2021   SpO2 98%   BMI 23.23 kg/m  Wt Readings from Last 3 Encounters:  05/04/21 115 lb (52.2 kg)  05/02/21 119 lb (54 kg)  05/01/21 119  lb (54 kg)    Health Maintenance Due  Topic Date Due   TETANUS/TDAP  Never done    There are no preventive care reminders to display for this patient.   Lab Results  Component Value Date   TSH 1.05 01/16/2021   Lab Results  Component Value Date   WBC 8.8 05/02/2021   HGB  11.3 (L) 05/02/2021   HCT 36.6 05/02/2021   MCV 79.7 (L) 05/02/2021   PLT 263 05/02/2021   Lab Results  Component Value Date   NA 138 05/02/2021   K 3.7 05/02/2021   CO2 24 05/02/2021   GLUCOSE 99 05/02/2021   BUN 6 05/02/2021   CREATININE 0.64 05/02/2021   BILITOT 0.3 05/02/2021   ALKPHOS 43 05/02/2021   AST 18 05/02/2021   ALT 14 05/02/2021   PROT 6.0 (L) 05/02/2021   ALBUMIN 3.0 (L) 05/02/2021   CALCIUM 8.6 (L) 05/02/2021   ANIONGAP 6 05/02/2021   EGFR 110 04/30/2021   No results found for: CHOL No results found for: HDL No results found for: LDLCALC No results found for: TRIG No results found for: CHOLHDL No results found for: HGBA1C     Assessment & Plan:   Kaycee was seen today for abdominal pain and emesis.  Diagnoses and all orders for this visit:  Nausea and vomiting, intractability of vomiting not specified, unspecified vomiting type -     promethazine (PHENERGAN) 25 MG suppository; Place 1 suppository (25 mg total) rectally every 6 (six) hours as needed for nausea or vomiting.  Diarrhea, unspecified type  Generalized abdominal pain  Patient has had reassuring work up in ED 2 days ago with reassuring labs and CT that was consistent with enteritis. She is currently being treated with cipro, flagyl, bentyl, and phenergan. Reports inability to keep down fluids and difficulty keep down her medications. Phenergan suppositories ordered today. Discussed rehydration fluids. Mild generalized abdominal tenderness on exam, unchanged from previous exams. Referral to GI has been placed. Discussed that our office does not have admitting privileges for the hospital. If she is unable to keep  down fluids with phenergan suppositories, recommended ED evaluation again.   The patient indicates understanding of these issues and agrees with the plan.  Gwenlyn Perking, FNP

## 2021-05-04 NOTE — Patient Instructions (Signed)
Nausea and Vomiting, Adult Nausea is the feeling that you have an upset stomach or that you are about to vomit. Vomiting is when stomach contents are thrown up and out of the mouth as a result of nausea. Vomiting can make you feel weak and cause you to becomedehydrated. Dehydration can make you feel tired and thirsty, cause you to have a dry mouth, and decrease how often you urinate. Older adults and people with other diseases or a weak disease-fighting system (immune system) are at higher risk for dehydration. It is important to treat your nausea andvomiting as told by your health care provider. Follow these instructions at home: Watch your symptoms for any changes. Tell your health care provider about them.Follow these instructions to care for yourself at home. Eating and drinking     Take an oral rehydration solution (ORS). This is a drink that is sold at pharmacies and retail stores. Drink clear fluids slowly and in small amounts as you are able. Clear fluids include water, ice chips, low-calorie sports drinks, and fruit juice that has water added (diluted fruit juice). Eat bland, easy-to-digest foods in small amounts as you are able. These foods include bananas, applesauce, rice, lean meats, toast, and crackers. Avoid fluids that contain a lot of sugar or caffeine, such as energy drinks, sports drinks, and soda. Avoid alcohol. Avoid spicy or fatty foods. General instructions Take over-the-counter and prescription medicines only as told by your health care provider. Drink enough fluid to keep your urine pale yellow. Wash your hands often using soap and water. If soap and water are not available, use hand sanitizer. Make sure that all people in your household wash their hands well and often. Rest at home while you recover. Watch your condition for any changes. Breathe slowly and deeply when you feel nauseated. Keep all follow-up visits as told by your health care provider. This is  important. Contact a health care provider if: Your symptoms get worse. You have new symptoms. You have a fever. You cannot drink fluids without vomiting. Your nausea does not go away after 2 days. You feel light-headed or dizzy. You have a headache. You have muscle cramps. You have a rash. You have pain while urinating. Get help right away if: You have pain in your chest, neck, arm, or jaw. You feel extremely weak or you faint. You have persistent vomiting. You have vomit that is bright red or looks like black coffee grounds. You have bloody or black stools or stools that look like tar. You have a severe headache, a stiff neck, or both. You have severe pain, cramping, or bloating in your abdomen. You have difficulty breathing, or you are breathing very quickly. Your heart is beating very quickly. Your skin feels cold and clammy. You feel confused. You have signs of dehydration, such as: Dark urine, very little urine, or no urine. Cracked lips. Dry mouth. Sunken eyes. Sleepiness. Weakness. These symptoms may represent a serious problem that is an emergency. Do not wait to see if the symptoms will go away. Get medical help right away. Call your local emergency services (911 in the [place] Do not drive yourself to the hospital. Summary Nausea is the feeling that you have an upset stomach or that you are about to vomit. As nausea gets worse, it can lead to vomiting. Vomiting can make you feel weak and cause you to become dehydrated. Follow instructions from your health care provider about eating and drinking to prevent dehydration. Take over-the-counter and prescription  medicines only as told by your health care provider. Contact your health care provider if your symptoms get worse, or you have new symptoms. Keep all follow-up visits as told by your health care provider. This is important. This information is not intended to replace advice given to you by your health care provider.  Make sure you discuss any questions you have with your healthcare provider. Document Revised: 01/15/2019 Document Reviewed: 03/03/2018 Elsevier Patient Education  2022 [employer].

## 2021-05-05 ENCOUNTER — Encounter (HOSPITAL_COMMUNITY): Payer: Self-pay | Admitting: Emergency Medicine

## 2021-05-05 ENCOUNTER — Emergency Department (HOSPITAL_COMMUNITY)
Admission: EM | Admit: 2021-05-05 | Discharge: 2021-05-05 | Disposition: A | Discharge status: Home / Self Care | Payer: BC Managed Care – PPO | Attending: Emergency Medicine | Admitting: Emergency Medicine

## 2021-05-05 ENCOUNTER — Emergency Department (HOSPITAL_COMMUNITY): Payer: BC Managed Care – PPO

## 2021-05-05 ENCOUNTER — Other Ambulatory Visit: Payer: Self-pay

## 2021-05-05 DIAGNOSIS — R112 Nausea with vomiting, unspecified: Secondary | ICD-10-CM | POA: Diagnosis present

## 2021-05-05 DIAGNOSIS — R1084 Generalized abdominal pain: Secondary | ICD-10-CM | POA: Insufficient documentation

## 2021-05-05 DIAGNOSIS — R1033 Periumbilical pain: Secondary | ICD-10-CM | POA: Insufficient documentation

## 2021-05-05 DIAGNOSIS — R1031 Right lower quadrant pain: Secondary | ICD-10-CM | POA: Diagnosis not present

## 2021-05-05 DIAGNOSIS — E876 Hypokalemia: Secondary | ICD-10-CM | POA: Insufficient documentation

## 2021-05-05 LAB — CBC WITH DIFFERENTIAL/PLATELET
Abs Immature Granulocytes: 0.05 10*3/uL (ref 0.00–0.07)
Basophils Absolute: 0.1 10*3/uL (ref 0.0–0.1)
Basophils Relative: 0 %
Eosinophils Absolute: 0 10*3/uL (ref 0.0–0.5)
Eosinophils Relative: 0 %
HCT: 42 % (ref 36.0–46.0)
Hemoglobin: 13.3 g/dL (ref 12.0–15.0)
Immature Granulocytes: 0 %
Lymphocytes Relative: 9 %
Lymphs Abs: 1.3 10*3/uL (ref 0.7–4.0)
MCH: 24.3 pg — ABNORMAL LOW (ref 26.0–34.0)
MCHC: 31.7 g/dL (ref 30.0–36.0)
MCV: 76.8 fL — ABNORMAL LOW (ref 80.0–100.0)
Monocytes Absolute: 1.7 10*3/uL — ABNORMAL HIGH (ref 0.1–1.0)
Monocytes Relative: 12 %
Neutro Abs: 11 10*3/uL — ABNORMAL HIGH (ref 1.7–7.7)
Neutrophils Relative %: 79 %
Platelets: 398 10*3/uL (ref 150–400)
RBC: 5.47 MIL/uL — ABNORMAL HIGH (ref 3.87–5.11)
RDW: 16.7 % — ABNORMAL HIGH (ref 11.5–15.5)
WBC: 14.2 10*3/uL — ABNORMAL HIGH (ref 4.0–10.5)
nRBC: 0 % (ref 0.0–0.2)

## 2021-05-05 LAB — COMPREHENSIVE METABOLIC PANEL
ALT: 51 U/L — ABNORMAL HIGH (ref 0–44)
AST: 51 U/L — ABNORMAL HIGH (ref 15–41)
Albumin: 3.5 g/dL (ref 3.5–5.0)
Alkaline Phosphatase: 52 U/L (ref 38–126)
Anion gap: 14 (ref 5–15)
BUN: <5 mg/dL — ABNORMAL LOW (ref 6–20)
CO2: 23 mmol/L (ref 22–32)
Calcium: 9.8 mg/dL (ref 8.9–10.3)
Chloride: 101 mmol/L (ref 98–111)
Creatinine, Ser: 0.75 mg/dL (ref 0.44–1.00)
GFR, Estimated: >60 mL/min (ref >60)
Glucose, Bld: 119 mg/dL — ABNORMAL HIGH (ref 70–99)
Potassium: 3.1 mmol/L — ABNORMAL LOW (ref 3.5–5.1)
Sodium: 138 mmol/L (ref 135–145)
Total Bilirubin: 0.8 mg/dL (ref 0.3–1.2)
Total Protein: 6.9 g/dL (ref 6.5–8.1)

## 2021-05-05 LAB — URINALYSIS, ROUTINE W REFLEX MICROSCOPIC
Bacteria, UA: NONE SEEN
Bilirubin Urine: NEGATIVE
Glucose, UA: NEGATIVE mg/dL
Hgb urine dipstick: NEGATIVE
Ketones, ur: 80 mg/dL — AB
Leukocytes,Ua: NEGATIVE
Nitrite: NEGATIVE
Protein, ur: 100 mg/dL — AB
Specific Gravity, Urine: 1.028 (ref 1.005–1.030)
pH: 6 (ref 5.0–8.0)

## 2021-05-05 LAB — LIPASE, BLOOD: Lipase: 26 U/L (ref 11–51)

## 2021-05-05 LAB — I-STAT BETA HCG BLOOD, ED (MC, WL, AP ONLY): I-stat hCG, quantitative: <5 m[IU]/mL (ref <5)

## 2021-05-05 LAB — C-REACTIVE PROTEIN: CRP: 0.7 mg/dL (ref <1.0)

## 2021-05-05 LAB — MAGNESIUM: Magnesium: 1.8 mg/dL (ref 1.7–2.4)

## 2021-05-05 MED ORDER — ONDANSETRON HCL 4 MG/2ML IJ SOLN
4.0000 mg | Freq: Once | INTRAMUSCULAR | Status: DC
  Filled 2021-05-05: qty 2

## 2021-05-05 MED ORDER — METOCLOPRAMIDE HCL 5 MG/ML IJ SOLN
10.0000 mg | Freq: Once | INTRAMUSCULAR | Status: AC
  Administered 2021-05-05: 10 mg via INTRAVENOUS
  Filled 2021-05-05: qty 2

## 2021-05-05 MED ORDER — DICYCLOMINE HCL 10 MG/ML IM SOLN
20.0000 mg | Freq: Once | INTRAMUSCULAR | Status: AC
  Administered 2021-05-05: 20 mg via INTRAMUSCULAR
  Filled 2021-05-05: qty 2

## 2021-05-05 MED ORDER — IOHEXOL 300 MG/ML  SOLN
75.0000 mL | Freq: Once | INTRAMUSCULAR | Status: AC | PRN
  Administered 2021-05-05: 75 mL via INTRAVENOUS

## 2021-05-05 MED ORDER — PROMETHAZINE HCL 25 MG RE SUPP: 25.0000 mg | Freq: Four times a day (QID) | RECTAL | 1 refills | Status: DC | PRN

## 2021-05-05 MED ORDER — SODIUM CHLORIDE 0.9 % IV BOLUS
1000.0000 mL | Freq: Once | INTRAVENOUS | Status: AC
  Administered 2021-05-05: 1000 mL via INTRAVENOUS

## 2021-05-05 MED ORDER — POTASSIUM CHLORIDE ER 10 MEQ PO TBCR: 10.0000 meq | EXTENDED_RELEASE_TABLET | Freq: Every day | ORAL | 0 refills | Status: DC

## 2021-05-05 MED ORDER — POTASSIUM CHLORIDE 10 MEQ/100ML IV SOLN
10.0000 meq | Freq: Once | INTRAVENOUS | Status: AC
  Administered 2021-05-05: 10 meq via INTRAVENOUS
  Filled 2021-05-05: qty 100

## 2021-05-05 MED ORDER — SODIUM CHLORIDE 0.9 % IV SOLN
12.5000 mg | Freq: Once | INTRAVENOUS | Status: AC
  Administered 2021-05-05: 12.5 mg via INTRAVENOUS
  Filled 2021-05-05: qty 0.5

## 2021-05-05 MED ORDER — MORPHINE SULFATE (PF) 2 MG/ML IV SOLN
2.0000 mg | Freq: Once | INTRAVENOUS | Status: AC
  Administered 2021-05-05: 2 mg via INTRAVENOUS
  Filled 2021-05-05: qty 1

## 2021-05-05 NOTE — ED Triage Notes (Signed)
Pt reports intermittent R sided abd pain x 1 month.  Reports nausea, vomiting, and diarrhea x 2 weeks.

## 2021-05-05 NOTE — ED Provider Notes (Signed)
[facility] EMERGENCY DEPARTMENT Provider Note   CSN: [id number] Arrival date & time: 05/05/21  1441     History Chief Complaint  Patient presents with   Abdominal Pain    Bethany Hall is a 30 y.o. female who presents to the ED today with complaint of ongoing nausea and nonbloody nonbilious emesis for the past 2 weeks.  She reports she started having a gnawing pain in her abdomen about 1 month ago with intermittent nausea and vomiting however over the past 2 weeks it is gotten worse and more persistent in nature.  Patient has been seen twice in the ED for same.  Initially seen on 07/26 for same.  Initial CT scan showed concern for enteritis.  She was discharged home with antibiotics.  She returned to the ED again the following day with continued symptoms.  She was treated with IV fluids, antiemetics, Haldol.  She was able to tolerate p.o. and was discharged home.  She subsequently had a follow-up appointment with her PCP at [facility] yesterday.  Reports she continued having nausea and vomiting and unable to keep anything down.  It appears that she did request to be admitted by her PCP due to intractable nausea and vomiting however they were advised her that they could not admit from their facility and that if she truly was unable to keep anything down she needed to go back to the ED for further evaluation.  States that she has felt chilly at home however denies any fevers.  Denies smoking marijuana.  She does report that she was eating at a [employer] prior to all this pain starting.  She plans to see GI however cannot see them until September.  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  The history is provided by the patient and medical records.      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   GERD (gastroesophageal reflux disease)    occas tums prm   Medical history non-contributory    Neuromuscular disorder (HCC)    carpel tunnel bilateral    Patient Active Problem List   Diagnosis Date Noted   Hyperpigmentation 01/16/2021   Degenerative disc disease, cervical 10/20/2018   Chronic bilateral thoracic back pain 10/20/2018   Musculoskeletal pain of right upper extremity 10/20/2018   Chronically dry eyes, bilateral 10/20/2018   Dry mouth 10/20/2018    Past Surgical History:  Procedure Laterality Date   CESAREAN SECTION  2011,2014,2016,02/2017   x 4   DILATION AND EVACUATION N/A 08/07/2017   Procedure: DILATATION AND EVACUATION WITH ULTRASOUND GUIDANCE ;  Surgeon: Shea Evans, MD;  Location: [facility];  Service: Gynecology;  Laterality: N/A;     OB History     Gravida  5   Para  4   Term  3   Preterm  1   AB  0   Living  4      SAB  0   IAB  0   Ectopic  0   Multiple  0   Live Births  4           Family History  Problem Relation Age of Onset   Diabetes Mother    Heart disease Father    Adrenal disorder Neg Hx     Social History   Tobacco Use   Smoking status: Never   Smokeless tobacco: Never  Vaping Use   Vaping Use: Never used  Substance Use Topics   Alcohol use: No    Comment: occ   Drug use: No    Home Medications Prior to Admission medications   Medication Sig Start Date End Date Taking? Authorizing Provider  potassium chloride (KLOR-CON) 10 MEQ tablet Take 1 tablet (10 mEq total) by mouth daily for 5 days. 05/05/21 05/10/21 Yes Nadelyn Enriques, PA-C  promethazine (PHENERGAN) 25 MG suppository Place 1 suppository (25 mg total) rectally every 6 (six) hours as needed for nausea or vomiting. 05/05/21  Yes Wylee Ogden, PA-C  ciprofloxacin (CIPRO) 500 MG tablet Take 1 tablet (500 mg total) by mouth every 12 (twelve) hours. 05/01/21    Henderly, Britni A, PA-C  dicyclomine (BENTYL) 20 MG tablet Take 1 tablet (20 mg total) by mouth 2 (two) times daily. 05/01/21   Henderly, Britni A, PA-C  escitalopram (LEXAPRO) 10 MG tablet Take 1 tablet (10 mg total) by mouth daily. 04/19/21   Gabriel Earing, FNP  famotidine (PEPCID) 20 MG tablet Take 1 tablet (20 mg total) by mouth 2 (two) times daily. 04/27/21   Gabriel Earing, FNP  hydrocortisone-pramoxine (PROCTOFOAM [facility]) rectal foam Place 1 applicator rectally 2 (two) times daily. x7 day per flare. 04/05/21   Gabriel Earing, FNP  metroNIDAZOLE (FLAGYL) 500 MG tablet Take 1 tablet (500 mg total) by mouth 2 (two) times daily. 05/01/21   Henderly, Britni A, PA-C  Norethindrone-Ethinyl Estradiol-Fe Biphas (LO LOESTRIN FE) 1 MG-10 MCG / 10 MCG tablet Take 1 tablet by mouth daily. 01/12/21   Gabriel Earing, FNP  omeprazole (PRILOSEC) 20 MG capsule Take 1 capsule (20 mg total) by mouth daily. 04/27/21   Gabriel Earing, FNP  ondansetron (ZOFRAN ODT) 8 MG disintegrating tablet 8mg  ODT q4 hours prn nausea 05/03/21   05/05/21, MD  promethazine (PHENERGAN) 25 MG suppository Place 1 suppository (25 mg total) rectally every 6 (six) hours as needed for nausea or vomiting. 05/04/21   05/06/21, FNP  promethazine (PHENERGAN) 25 MG tablet Take 1 tablet (25 mg total) by mouth every 6 (six) hours as needed for nausea or vomiting. 05/01/21   Henderly, Britni A, PA-C  witch hazel-glycerin (TUCKS) pad Apply 1 application topically as needed for itching. 04/19/21   04/21/21, FNP    Allergies    Penicillins  Review of Systems   Review of Systems  Constitutional:  Positive for chills. Negative for fever.  Gastrointestinal:  Positive for abdominal pain, diarrhea, nausea and vomiting.  All other systems reviewed and are negative.  Physical Exam Updated Vital Signs BP 123/76   Pulse 92   Temp 98.5 F (36.9 C) (Oral)   Resp 18   LMP 05/01/2021   SpO2 100%   Physical Exam Vitals and  nursing note reviewed.  Constitutional:      Appearance: She is not ill-appearing or diaphoretic.     Comments: Actively dry heaving in room  HENT:     Head: Normocephalic and atraumatic.     Mouth/Throat:     Mouth: Mucous membranes are dry.  Eyes:     Conjunctiva/sclera: Conjunctivae normal.  Cardiovascular:     Rate and Rhythm: Normal rate and regular rhythm.  Pulmonary:     Effort: Pulmonary effort is normal.     Breath sounds: Normal breath sounds. No wheezing, rhonchi or rales.  Abdominal:     Palpations: Abdomen is soft.     Tenderness: There is generalized abdominal tenderness and tenderness in the right lower quadrant and periumbilical area. There is no guarding or rebound.  Musculoskeletal:     Cervical back: Neck supple.  Skin:    General: Skin is warm and dry.  Neurological:     Mental Status: She is alert.    ED Results / Procedures / Treatments   Labs (all labs ordered are listed, but only abnormal results are displayed) Labs Reviewed  URINALYSIS, ROUTINE W REFLEX MICROSCOPIC - Abnormal; Notable for the following components:      Result Value   Color, Urine AMBER (*)    APPearance HAZY (*)    Ketones, ur 80 (*)    Protein, ur 100 (*)    All other components within normal limits  COMPREHENSIVE METABOLIC PANEL - Abnormal; Notable for the following components:   Potassium 3.1 (*)    Glucose, Bld 119 (*)    BUN <5 (*)    AST 51 (*)    ALT 51 (*)    All other components within normal limits  CBC WITH DIFFERENTIAL/PLATELET - Abnormal; Notable for the following components:   WBC 14.2 (*)    RBC 5.47 (*)    MCV 76.8 (*)    MCH 24.3 (*)    RDW 16.7 (*)    Neutro Abs 11.0 (*)    Monocytes Absolute 1.7 (*)    All other components within normal limits  GASTROINTESTINAL PANEL BY PCR, STOOL (REPLACES STOOL CULTURE)  C DIFFICILE QUICK SCREEN W PCR REFLEX    CALPROTECTIN, FECAL  LIPASE, BLOOD  MAGNESIUM  C-REACTIVE PROTEIN  I-STAT BETA HCG BLOOD, ED ([facility], [facility],  [facility] ONLY)    EKG None  Radiology CT Abdomen Pelvis W Contrast  Result Date: 05/05/2021 CLINICAL DATA:  Acute nonlocalized abdominal pain EXAM: CT ABDOMEN AND PELVIS WITH CONTRAST TECHNIQUE: Multidetector CT imaging of the abdomen and pelvis was performed using the standard protocol following bolus administration of intravenous contrast. CONTRAST:  95mL OMNIPAQUE IOHEXOL 300 MG/ML  SOLN COMPARISON:  05/01/2021 FINDINGS: Lower chest: No acute abnormality. Hepatobiliary: 2.4 cm low-attenuation lesion within the right hepatic lobe is again seen is unchanged possibly representing a complex cyst, but again, not optimally characterized on this examination. Additional tiny probable scattered cysts noted within the right hepatic dome. The liver is otherwise unremarkable. No intra or extrahepatic biliary ductal dilation. Gallbladder is absent. Pancreas: Unremarkable Spleen: Unremarkable Adrenals/Urinary Tract: Adrenal glands are unremarkable. Kidneys are normal, without renal calculi, focal lesion, or hydronephrosis. Bladder is unremarkable. Stomach/Bowel: Similar to prior examination, there is a long segment region of circumferential bowel wall thickening involving the fourth portion of the duodenum and proximal jejunum with associated extensive shotty mesenteric adenopathy. The bowel lumen does not appear dilated. This is nonspecific and can be seen the setting of infectious or inflammatory enteritis, such as Whipple's disease, eosinophilic enteritis, small-bowel amyloidosis, connective tissue disorders, or Crohn's enteritis. There is a relatively narrow transition to more normal appearing bowel best appreciated on coronal image # 15/6. There is no evidence of obstruction or perforation. The distal small bowel, colon, and appendix are unremarkable. The stomach is unremarkable. Vascular/Lymphatic: The abdominal vasculature is unremarkable. No pathologic retroperitoneal or pelvic adenopathy is identified.  Reproductive: Dominant follicle within the left ovary.  The pelvic organs are otherwise unremarkable. Other: No abdominal wall hernia.  Rectum unremarkable. Musculoskeletal: No acute bone abnormality. No lytic or blastic bone lesions are identified. IMPRESSION: Stable appearance of long segment bowel wall thickening involving the fourth portion of the duodenum and proximal jejunum with associated prominent shotty mesenteric adenopathy. Differential considerations are broad with potential considerations as listed above. No evidence of obstruction or perforation. Small-bowel enteroscopy or capsule endoscopy may be helpful for further evaluation. Stable indeterminate low-attenuation lesion within the liver, possibly representing a complex cyst. Comparison with remote prior examinations would be helpful in determining stability. If none are available, this could be further assessed with MRI examination, if indicated. Electronically Signed   By: Helyn Numbers MD   On: 05/05/2021 19:50    Procedures Procedures   Medications Ordered in ED Medications  ondansetron [facility]) injection 4 mg (has no administration in time range)  sodium chloride 0.9 % bolus 1,000 mL (0 mLs Intravenous Stopped 05/05/21 1931)  promethazine (PHENERGAN) 12.5 mg in sodium chloride 0.9 % 50 mL IVPB (0 mg Intravenous Stopped 05/05/21 2156)  potassium chloride 10 mEq in 100 mL IVPB (0 mEq Intravenous Stopped 05/05/21 1930)  metoCLOPramide (REGLAN) injection 10 mg (10 mg Intravenous Given 05/05/21 1819)  morphine 2 MG/ML injection 2 mg (2 mg Intravenous Given 05/05/21 1825)  iohexol (OMNIPAQUE) 300 MG/ML solution 75 mL (75 mLs Intravenous Contrast Given 05/05/21 1848)  dicyclomine (BENTYL) injection 20 mg (20 mg Intramuscular Given 05/05/21 2143)    ED Course  I have reviewed the triage vital signs and the nursing notes.  Pertinent labs & imaging results that were available during my care of the patient were reviewed by me and considered in  my medical decision making (see chart for details).    MDM Rules/Calculators/A&P                           30 year old female presents to the ED today with persistent nausea, vomiting, abdominal pain for the past 2 weeks.  Initial CT scan done on 07/26 with concern for enteritis.  Currently on antibiotics however has been unable to keep anything down at home prompting return ED visit today.  Level to the ED patient is afebrile.  She is mildly tachycardic in the low 100s, nontachypneic.  She did have screening labs done in the waiting room including CBC, CMP, lipase.  CBC with a leukocytosis today 14,200 left shift.  Her blood cell count slightly elevated at 5.47.  Past white blood cell count normal.   WBC  Date Value Ref Range Status  05/05/2021 14.2 (H) 4.0 - 10.5 K/uL Final  05/02/2021 8.8 4.0 - 10.5 K/uL Final  05/01/2021 10.3 4.0 - 10.5 K/uL Final  04/30/2021 8.5 3.4 - 10.8 x10E3/uL Final  10/20/2018 8.6 3.4 - 10.8 x10E3/uL Final  08/07/2017 12.7 (H) 4.0 - 10.5 K/uL Final    CMP with a potassium of 3.1.  AST and ALT slightly elevated both of 51.  Patient without any right upper quadrant tenderness palpation.  Her pain is mostly in the periumbilical region.  Lipase 26.  Beta-hCG negative.   On my exam patient is actively dry heaving.  She appears uncomfortable and is dry mucous membranes.  Is noted to have diffuse abdominal pain however worse in the periumbilical region.  Given leukocytosis today which is new from previously being seen we will plan for repeat CT scan at this time to assess for any worsening of  her enteritis versus other etiology.  We will plan for fluids, antiemetics, pain medication.  We will add on magnesium level due to low potassium.  Will replete potassium in the ED today.  Patient will need to be monitored closely and fluid challenge.  If unable to successfully be fluid challenge may consider admission for intractable nausea and vomiting given this is her third ED visit  in the past 4 days.   CT: IMPRESSION:  Stable appearance of long segment bowel wall thickening involving  the fourth portion of the duodenum and proximal jejunum with  associated prominent shotty mesenteric adenopathy. Differential  considerations are broad with potential considerations as listed  above. No evidence of obstruction or perforation. Small-bowel  enteroscopy or capsule endoscopy may be helpful for further  evaluation.     Stable indeterminate low-attenuation lesion within the liver,  possibly representing a complex cyst. Comparison with remote prior  examinations would be helpful in determining stability. If none are  available, this could be further assessed with MRI examination, if  indicated.   On reevaluation pt resting; she has not had anymore emesis since being in the ED. She does endorse she drank a small amount of water however endorses "burning "in her stomach from same. She would like some ice chips and gingerale however due to her mouth feeling dry. Will plan to fluid challenge. Given this is pt's 3rd ED visit will plan to discuss case with gastroenterology. PT reports she is planning to see a GI specialist in [place], [place] in September however cannot recall the name.   Discussed case with Dr. Chales Abrahams GI; will try to see if pt can be seen in a [facility] clinic sooner than September for scope. Would like CRP added to work up and if patient able to provide stool samples recommends GI panel, C diff, and calprotectin. Agrees with continuation of abx, bentyl, and PPI which pt already has. Had discussed possibility of steroids however does not want it to invalidate scope findings.   Pt unable to provide stool sample in the ED. She has been able to drink small sips of gingerale and tolerated it without emesis. I do not feel she requires admission at this time. Will discharge with phenergan suppositories to see if this does not help with her nausea. She is advised to continue taking the  omeprazole, bentyl, and abx with plans to hear from GI next week. Stable for discharge.   This note was prepared using Dragon voice recognition software and may include unintentional dictation errors due to the inherent limitations of voice recognition software.   Final Clinical Impression(s) / ED Diagnoses Final diagnoses:  Generalized abdominal pain  Nausea vomiting and diarrhea  Hypokalemia    Rx / DC Orders ED Discharge Orders          Ordered    promethazine (PHENERGAN) 25 MG suppository  Every 6 hours PRN        05/05/21 2209    potassium chloride (KLOR-CON) 10 MEQ tablet  Daily        05/05/21 2212             Discharge Instructions      Please pick up prescription at pharmacy and take as prescribed. I would recommend taking this every 6 hours as needed for nausea.   Continue taking your omeprazole first thing in the morning. 30 minutes later you can take your antibiotics which you should continue. You can take the Bentyl (Dicyclomine) as prescribed for abdominal  pain.   Dr. Chales AbrahamsGupta with [facility] and his team will contact you early this following week for outpatient follow up as you will likely need scoping done for further evaluation. If you have not heard from them by the end of the week please reach out to their office.   Follow up with your PCP as well regarding ED visit. Your potassium level was slightly decreased in the ED Likely secondary to the vomiting and diarrhea you have been having. I have prescribed additional potassium supplements as well. Please have your potassium level rechecked in 1-2 weeks.   Return to the ED for any new/worsening symptoms         Tanda RockersVenter, Makensey Rego, Cordelia Poche-C 05/05/21 2213    Melene PlanFloyd, Dan, DO 05/05/21 2231

## 2021-05-05 NOTE — ED Notes (Signed)
Pt back from ct, pt reports decreased pain and denies nausea at this time.

## 2021-05-05 NOTE — ED Provider Notes (Signed)
  [facility] EMERGENCY DEPARTMENT Provider Note   CSN: [id number] Arrival date & time: 05/05/21  1441     History Chief Complaint  Patient presents with   Abdominal Pain    Emergency Medicine Provider Triage Evaluation Note  Bethany Hall is a 30 y.o. female presenting with major abdominal pain and nausea and vomiting for the past 2 weeks.  Has been seen for the same multiple times in the last month.  States that she is unable to keep food and drink down and is having on and off diarrhea.   Review of Systems  Positive: Abd pain, nvd, dizziness Negative: Blood in stool, syncope  Physical Exam  BP (!) 122/93 (BP Location: Left Arm)   Pulse 100   Temp 98.2 F (36.8 C) (Oral)   Resp 16   LMP 05/01/2021   SpO2 97%  Gen:   Awake, mild distress Resp:  Normal effort  GI: tenderness around the umbilicus and rlq.  Medical Decision Making  Medically screening exam initiated at 3:15 PM.  Appropriate orders placed.  Bethany Hall was informed that the remainder of the evaluation will be completed by another provider, this initial triage assessment does not replace that evaluation, and the importance of remaining in the ED until their evaluation is complete.  Abdominal imaging deferred due to recent scans.  Will defer to primary provider.   Woodroe Chen 05/05/21 1517    Terald Sleeper, MD 05/06/21 [phone]

## 2021-05-05 NOTE — Discharge Instructions (Addendum)
Please pick up prescription at pharmacy and take as prescribed. I would recommend taking this every 6 hours as needed for nausea.   Continue taking your omeprazole first thing in the morning. 30 minutes later you can take your antibiotics which you should continue. You can take the Bentyl (Dicyclomine) as prescribed for abdominal pain.   Dr. Chales Abrahams with [facility] and his team will contact you early this following week for outpatient follow up as you will likely need scoping done for further evaluation. If you have not heard from them by the end of the week please reach out to their office.   Follow up with your PCP as well regarding ED visit. Your potassium level was slightly decreased in the ED Likely secondary to the vomiting and diarrhea you have been having. I have prescribed additional potassium supplements as well. Please have your potassium level rechecked in 1-2 weeks.   Return to the ED for any new/worsening symptoms

## 2021-05-07 ENCOUNTER — Other Ambulatory Visit: Payer: Self-pay | Admitting: Family Medicine

## 2021-05-07 DIAGNOSIS — R1084 Generalized abdominal pain: Secondary | ICD-10-CM

## 2021-05-07 DIAGNOSIS — R197 Diarrhea, unspecified: Secondary | ICD-10-CM

## 2021-05-07 DIAGNOSIS — R112 Nausea with vomiting, unspecified: Secondary | ICD-10-CM

## 2021-05-07 NOTE — Telephone Encounter (Signed)
Referrals placed.  I have not seen this patient at all for this and please make sure that you send the appropriate notes with referral

## 2021-05-22 ENCOUNTER — Other Ambulatory Visit: Payer: Self-pay | Admitting: Family Medicine

## 2021-05-22 ENCOUNTER — Ambulatory Visit: Payer: BC Managed Care – PPO | Admitting: Family Medicine

## 2021-05-22 DIAGNOSIS — B359 Dermatophytosis, unspecified: Secondary | ICD-10-CM

## 2021-05-23 ENCOUNTER — Encounter: Payer: Self-pay | Admitting: Family Medicine

## 2021-05-29 ENCOUNTER — Telehealth: Payer: Self-pay | Admitting: Family Medicine

## 2021-05-29 NOTE — Telephone Encounter (Signed)
LMOVM to clarify if the Clotrimizole-Betamethasone that was on her med list that was prescribed back on 11/28/20 is the medication that she is requesting

## 2021-05-29 NOTE — Telephone Encounter (Signed)
  Prescription Request  05/29/2021   What is the name of the medication or equipment? Foot Cream for athletes foot  Have you contacted your pharmacy to request a refill? yes  Which pharmacy would you like this sent to? [facility], [place]  Pt states that she is out of her foot ointment that Dr Nadine Counts last prescribed to her and needs a refill.   Please advise and call patient. Pt says if she is unable to answer due to being at work, please leave a voicemail.

## 2021-05-30 ENCOUNTER — Other Ambulatory Visit: Payer: Self-pay | Admitting: Family Medicine

## 2021-05-30 MED ORDER — CLOTRIMAZOLE-BETAMETHASONE 1-0.05 % EX CREA: 1.0000 "application " | TOPICAL_CREAM | Freq: Two times a day (BID) | CUTANEOUS | 0 refills | Status: DC

## 2021-05-30 NOTE — Telephone Encounter (Signed)
Done

## 2021-05-30 NOTE — Telephone Encounter (Signed)
Pt verified that the clotrimizole-betamethasone that was prescribed back on 11/28/20 is the one she is requesting Please advise since it is no longer on her med list

## 2021-06-13 ENCOUNTER — Ambulatory Visit: Payer: BC Managed Care – PPO | Admitting: Internal Medicine

## 2021-07-25 ENCOUNTER — Ambulatory Visit
Admission: EM | Admit: 2021-07-25 | Discharge: 2021-07-25 | Disposition: A | Discharge status: Home / Self Care | Payer: BC Managed Care – PPO | Attending: Urgent Care | Admitting: Urgent Care

## 2021-07-25 ENCOUNTER — Other Ambulatory Visit: Payer: Self-pay

## 2021-07-25 DIAGNOSIS — R0981 Nasal congestion: Secondary | ICD-10-CM | POA: Diagnosis present

## 2021-07-25 DIAGNOSIS — R070 Pain in throat: Secondary | ICD-10-CM | POA: Diagnosis present

## 2021-07-25 DIAGNOSIS — J069 Acute upper respiratory infection, unspecified: Secondary | ICD-10-CM | POA: Insufficient documentation

## 2021-07-25 DIAGNOSIS — Z113 Encounter for screening for infections with a predominantly sexual mode of transmission: Secondary | ICD-10-CM | POA: Diagnosis present

## 2021-07-25 MED ORDER — CETIRIZINE HCL 10 MG PO TABS: 10.0000 mg | ORAL_TABLET | Freq: Every day | ORAL | 0 refills | Status: DC

## 2021-07-25 MED ORDER — PSEUDOEPHEDRINE HCL 30 MG PO TABS: 30.0000 mg | ORAL_TABLET | Freq: Three times a day (TID) | ORAL | 0 refills | Status: DC | PRN

## 2021-07-25 NOTE — ED Triage Notes (Signed)
Onset yesterday of sore throat with itchiness, rhinorrhea and Pt woke up this morning w/ HA. Denies dysphagia. Has been taking Nyquil without relief.   Pt would like a full std panel including blood work.

## 2021-07-25 NOTE — ED Provider Notes (Signed)
[facility]   MRN: [id number] DOB: [birth year]  Subjective:   Bethany Hall is a 30 y.o. female presenting for 1 day history of scratchy throat, throat discomfort, runny and stuffy nose, developed a cough today.  No chest pain, shortness of breath, wheezing.  Would also like to be checked for sexually transmitted infection.  Patient has 1 female partner, uses condoms for protection consistently. Denies fever, n/v, abdominal pain, pelvic pain, rashes, dysuria, urinary frequency, hematuria, vaginal discharge.    No current facility-administered medications for this encounter.  Current Outpatient Medications:    ciprofloxacin (CIPRO) 500 MG tablet, Take 1 tablet (500 mg total) by mouth every 12 (twelve) hours., Disp: 10 tablet, Rfl: 0   clotrimazole-betamethasone (LOTRISONE) cream, Apply 1 application topically 2 (two) times daily. x7-10d, Disp: 30 g, Rfl: 0   dicyclomine (BENTYL) 20 MG tablet, Take 1 tablet (20 mg total) by mouth 2 (two) times daily., Disp: 20 tablet, Rfl: 0   escitalopram (LEXAPRO) 10 MG tablet, Take 1 tablet (10 mg total) by mouth daily., Disp: 30 tablet, Rfl: 5   famotidine (PEPCID) 20 MG tablet, Take 1 tablet (20 mg total) by mouth 2 (two) times daily., Disp: 180 tablet, Rfl: 0   hydrocortisone-pramoxine (PROCTOFOAM HC) rectal foam, Place 1 applicator rectally 2 (two) times daily. x7 day per flare., Disp: 10 g, Rfl: 2   metroNIDAZOLE (FLAGYL) 500 MG tablet, Take 1 tablet (500 mg total) by mouth 2 (two) times daily., Disp: 14 tablet, Rfl: 0   Norethindrone-Ethinyl Estradiol-Fe Biphas (LO LOESTRIN FE) 1 MG-10 MCG / 10 MCG tablet, Take 1 tablet by mouth daily., Disp: 28 tablet, Rfl: 11   omeprazole (PRILOSEC) 20 MG capsule, Take 1 capsule (20 mg total) by mouth daily., Disp: 90 capsule, Rfl: 0   ondansetron (ZOFRAN ODT) 8 MG disintegrating tablet, 8mg  ODT q4 hours prn nausea, Disp: 12 tablet, Rfl: 0   potassium chloride (KLOR-CON) 10 MEQ tablet, Take 1 tablet (10  mEq total) by mouth daily for 5 days., Disp: 5 tablet, Rfl: 0   promethazine (PHENERGAN) 25 MG suppository, Place 1 suppository (25 mg total) rectally every 6 (six) hours as needed for nausea or vomiting., Disp: 12 each, Rfl: 0   promethazine (PHENERGAN) 25 MG suppository, Place 1 suppository (25 mg total) rectally every 6 (six) hours as needed for nausea or vomiting., Disp: 12 each, Rfl: 1   promethazine (PHENERGAN) 25 MG tablet, Take 1 tablet (25 mg total) by mouth every 6 (six) hours as needed for nausea or vomiting., Disp: 30 tablet, Rfl: 0   witch hazel-glycerin (TUCKS) pad, Apply 1 application topically as needed for itching., Disp: 40 each, Rfl: 12   Allergies  Allergen Reactions   Penicillins Rash    Has patient had a PCN reaction causing immediate rash, facial/tongue/throat swelling, SOB or lightheadedness with hypotension: Unknown Has patient had a PCN reaction causing severe rash involving mucus membranes or skin necrosis: Unknown Has patient had a PCN reaction that required hospitalization: Unknown Has patient had a PCN reaction occurring within the last 10 years: No If all of the above answers are "NO", then may proceed with Cephalosporin use. Infantile Reaction.    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   GERD (gastroesophageal reflux disease)    occas tums prm   Medical history non-contributory  Neuromuscular disorder (HCC)    carpel tunnel bilateral     Past Surgical History:  Procedure Laterality Date   CESAREAN SECTION  2011,2014,2016,02/2017   x 4   DILATION AND EVACUATION N/A 08/07/2017   Procedure: DILATATION AND EVACUATION WITH ULTRASOUND GUIDANCE ;  Surgeon: Shea Evans, MD;  Location: [facility];  Service: Gynecology;  Laterality: N/A;    Family History  Problem Relation Age of Onset   Diabetes Mother    Heart disease Father    Adrenal  disorder Neg Hx     Social History   Tobacco Use   Smoking status: Never   Smokeless tobacco: Never  Vaping Use   Vaping Use: Never used  Substance Use Topics   Alcohol use: No    Comment: occ   Drug use: No    ROS   Objective:   Vitals: BP 110/72 (BP Location: Right Arm)   Pulse 74   Temp 98.8 F (37.1 C) (Oral)   Resp 18   LMP 07/04/2021 (Approximate)   SpO2 99%   Breastfeeding No   Physical Exam Constitutional:      General: She is not in acute distress.    Appearance: Normal appearance. She is well-developed and normal weight. She is not ill-appearing, toxic-appearing or diaphoretic.  HENT:     Head: Normocephalic and atraumatic.     Right Ear: Tympanic membrane, ear canal and external ear normal. No drainage or tenderness. No middle ear effusion. There is no impacted cerumen. Tympanic membrane is not erythematous.     Left Ear: Tympanic membrane, ear canal and external ear normal. No drainage or tenderness.  No middle ear effusion. There is no impacted cerumen. Tympanic membrane is not erythematous.     Nose: Nose normal. No congestion or rhinorrhea.     Mouth/Throat:     Mouth: Mucous membranes are moist. No oral lesions.     Pharynx: No pharyngeal swelling, oropharyngeal exudate, posterior oropharyngeal erythema or uvula swelling.     Tonsils: No tonsillar exudate or tonsillar abscesses.  Eyes:     General: No scleral icterus.       Right eye: No discharge.        Left eye: No discharge.     Extraocular Movements: Extraocular movements intact.     Right eye: Normal extraocular motion.     Left eye: Normal extraocular motion.     Conjunctiva/sclera: Conjunctivae normal.     Pupils: Pupils are equal, round, and reactive to light.  Cardiovascular:     Rate and Rhythm: Normal rate and regular rhythm.     Pulses: Normal pulses.     Heart sounds: Normal heart sounds. No murmur heard.   No friction rub. No gallop.  Pulmonary:     Effort: Pulmonary effort is  normal. No respiratory distress.     Breath sounds: Normal breath sounds. No stridor. No wheezing, rhonchi or rales.  Musculoskeletal:     Cervical back: Normal range of motion and neck supple.  Lymphadenopathy:     Cervical: No cervical adenopathy.  Skin:    General: Skin is warm and dry.     Findings: No rash.  Neurological:     General: No focal deficit present.     Mental Status: She is alert and oriented to person, place, and time.     Cranial Nerves: No cranial nerve deficit.     Motor: No weakness.     Coordination: Coordination normal.     Gait: Gait normal.  Deep Tendon Reflexes: Reflexes normal.  Psychiatric:        Mood and Affect: Mood normal.        Behavior: Behavior normal.        Thought Content: Thought content normal.        Judgment: Judgment normal.    Assessment and Plan :   PDMP not reviewed this encounter.  1. Viral URI with cough   2. Screen for STD (sexually transmitted disease)   3. Nasal congestion   4. Throat discomfort    Will manage for viral illness such as viral URI, viral syndrome, viral rhinitis, COVID-19. Counseled patient on nature of COVID-19 including modes of transmission, diagnostic testing, management and supportive care.  Offered scripts for symptomatic relief. COVID 19 testing is pending. Will treat for STIs based off of results. Counseled patient on potential for adverse effects with medications prescribed/recommended today, ER and return-to-clinic precautions discussed, patient verbalized understanding.     Wallis Bamberg, PA-C 07/25/21 1306

## 2021-07-25 NOTE — Discharge Instructions (Signed)
We will notify you of your COVID-19 test results as they arrive and may take between 48-72 hours.  I encourage you to sign up for MyChart if you have not already done so as this can be the easiest way for us to communicate results to you online or through a phone app.  Generally, we only contact you if it is a positive COVID result.  In the meantime, if you develop worsening symptoms including fever, chest pain, shortness of breath despite our current treatment plan then please report to the emergency room as this may be a sign of worsening status from possible COVID-19 infection.  Otherwise, we will manage this as a viral syndrome. For sore throat or cough try using a honey-based tea. Use 3 teaspoons of honey with juice squeezed from half lemon. Place shaved pieces of ginger into 1/2-1 cup of water and warm over stove top. Then mix the ingredients and repeat every 4 hours as needed. Please take Tylenol 500mg-650mg every 6 hours for aches and pains, fevers. Hydrate very well with at least 2 liters of water. Eat light meals such as soups to replenish electrolytes and soft fruits, veggies. Start an antihistamine like Zyrtec for postnasal drainage, sinus congestion.  You can take this together with pseudoephedrine (Sudafed) at a dose of 30 mg 2-3 times a day as needed for the same kind of congestion.    

## 2021-07-26 LAB — CERVICOVAGINAL ANCILLARY ONLY
Chlamydia: NEGATIVE
Comment: NEGATIVE
Comment: NEGATIVE
Comment: NORMAL
Neisseria Gonorrhea: NEGATIVE
Trichomonas: NEGATIVE

## 2021-07-26 LAB — HIV ANTIBODY (ROUTINE TESTING W REFLEX): HIV Screen 4th Generation wRfx: NONREACTIVE

## 2021-07-26 LAB — NOVEL CORONAVIRUS, NAA: SARS-CoV-2, NAA: NOT DETECTED

## 2021-07-26 LAB — RPR: RPR Ser Ql: NONREACTIVE

## 2021-07-26 LAB — SARS-COV-2, NAA 2 DAY TAT

## 2021-08-01 ENCOUNTER — Other Ambulatory Visit: Payer: Self-pay | Admitting: Family Medicine

## 2021-08-01 DIAGNOSIS — K219 Gastro-esophageal reflux disease without esophagitis: Secondary | ICD-10-CM

## 2021-09-27 IMAGING — CT CT ABD-PELV W/ CM
2 of 4 series · 14 of 46 positions shown, 16 images · IV contrast (Omnipaque or Isovue)
Comparison: None.

CLINICAL DATA: Acute non localized abdominal pain, diarrhea and
emesis for the past 2 weeks.

EXAM:
CT ABDOMEN AND PELVIS WITH CONTRAST
TECHNIQUE: Multidetector CT imaging of the abdomen and pelvis was performed
using the standard protocol following bolus administration of
intravenous contrast.
CONTRAST:  100mL OMNIPAQUE IOHEXOL 300 MG/ML  SOLN

[Series 2: axial st · axial · 0.77mm/px · z∈[+861,+1196]mm · 11 of 81 slices shown, 13 images]
[im 7/81  soft-tissue]
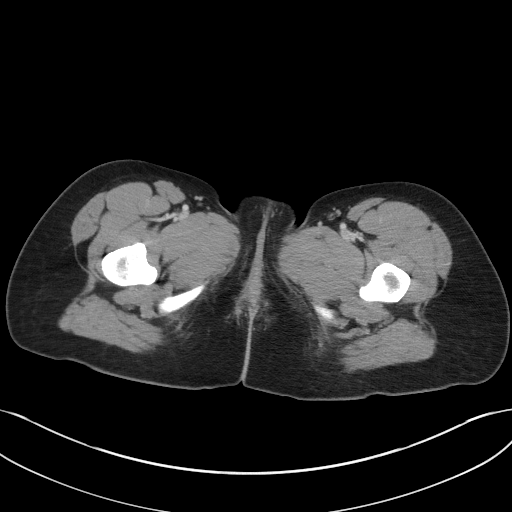
[im 7/81  bone]
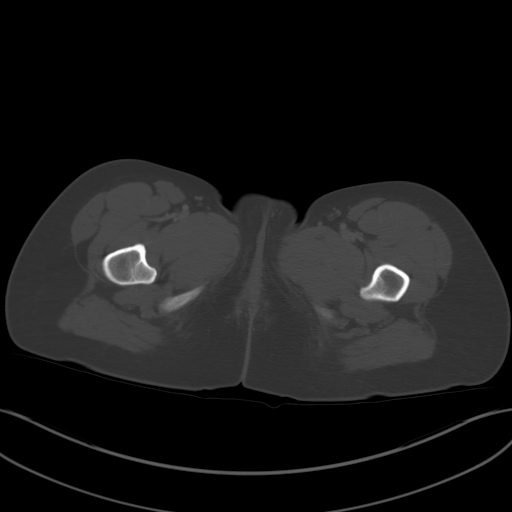
[im 14/81  soft-tissue]
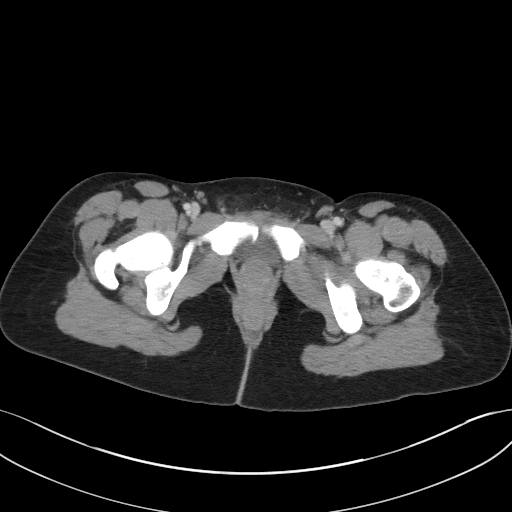
[im 21/81  soft-tissue]
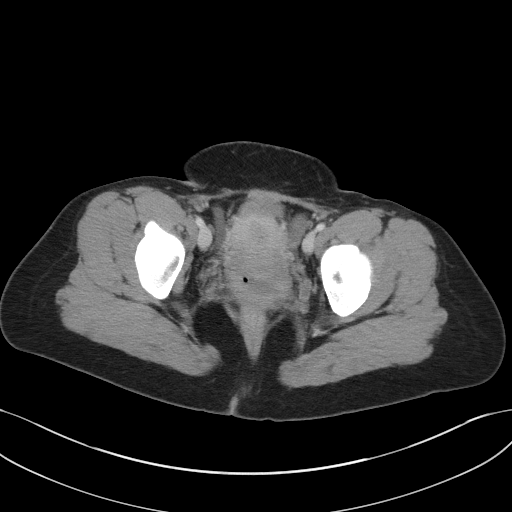
[im 27/81  soft-tissue]
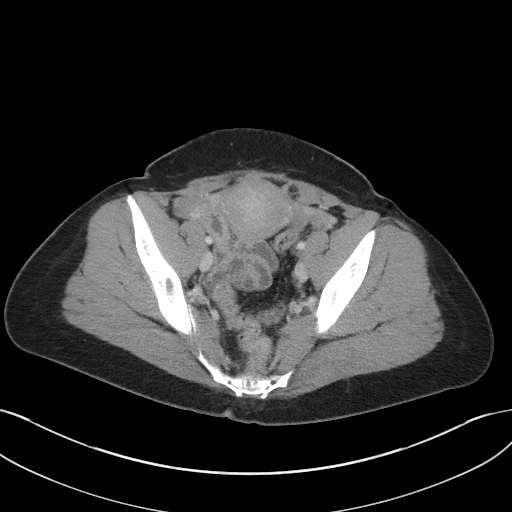
[im 34/81  soft-tissue]
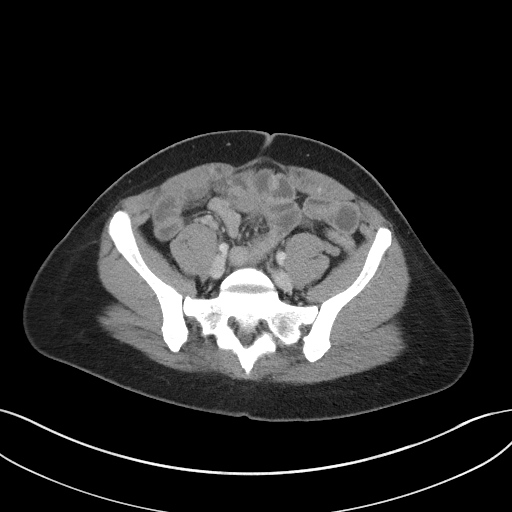
[im 41/81  soft-tissue]
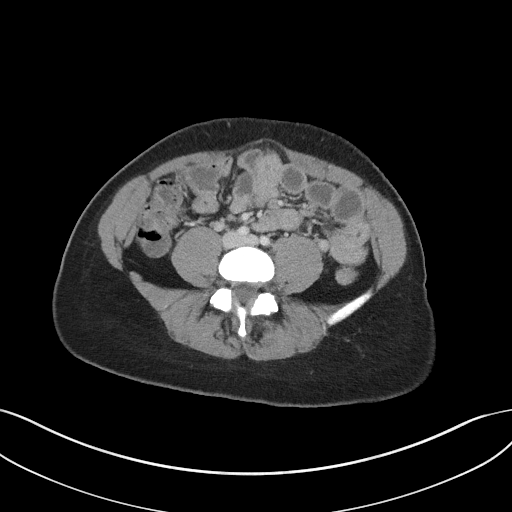
[im 47/81  soft-tissue]
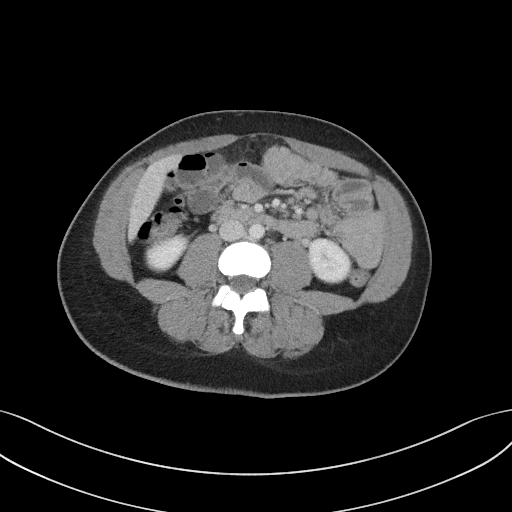
[im 54/81  soft-tissue]
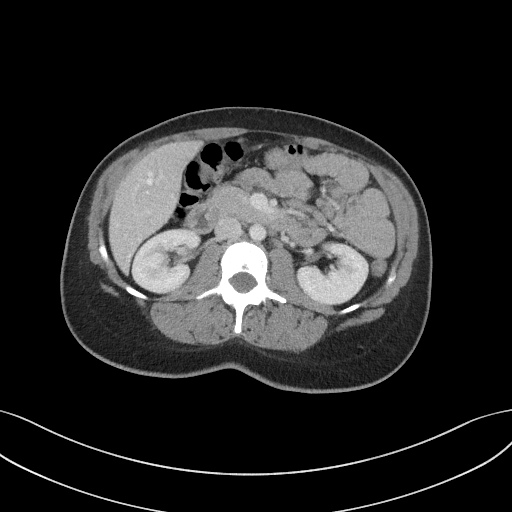
[im 61/81  soft-tissue]
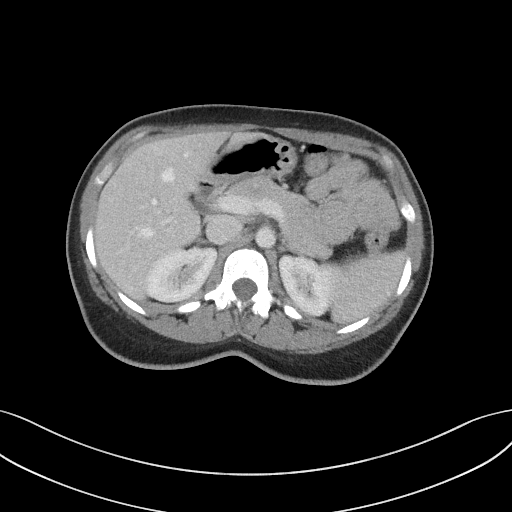
[im 61/81  bone]
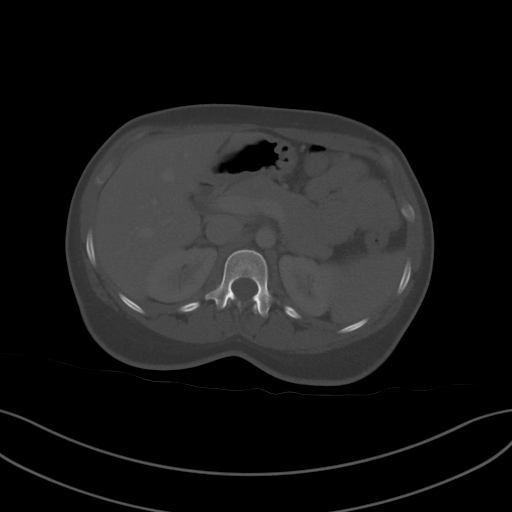
[im 67/81  soft-tissue]
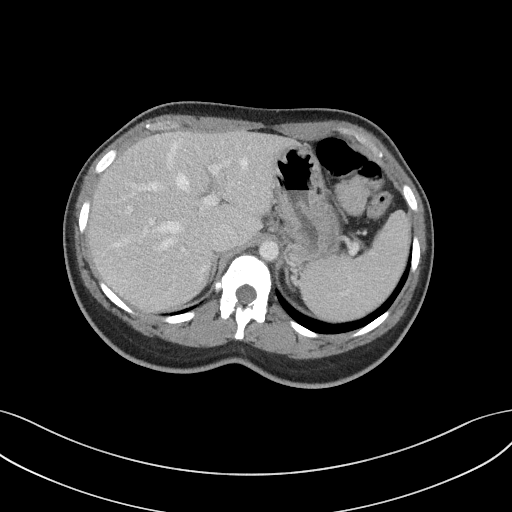
[im 74/81  soft-tissue]
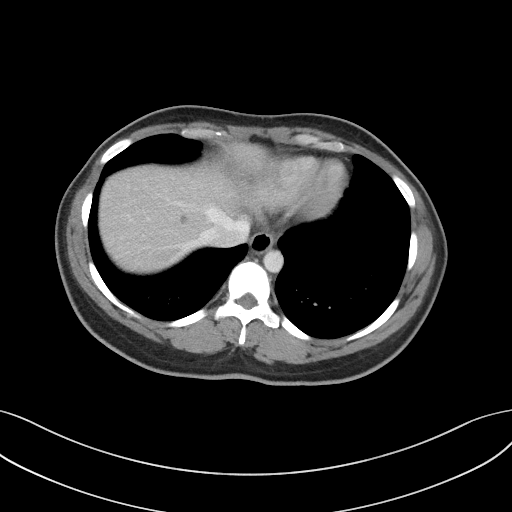

[Series 5: coronal st · coronal · 0.63mm/px · 3 of 85 slices shown]
[im 29/85  soft-tissue]
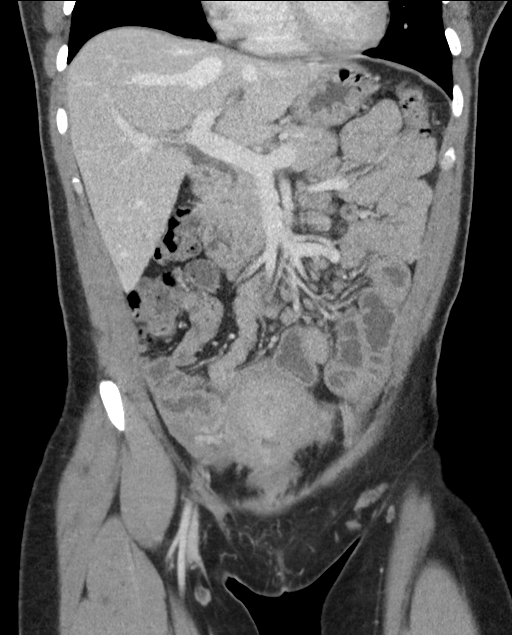
[im 38/85  soft-tissue]
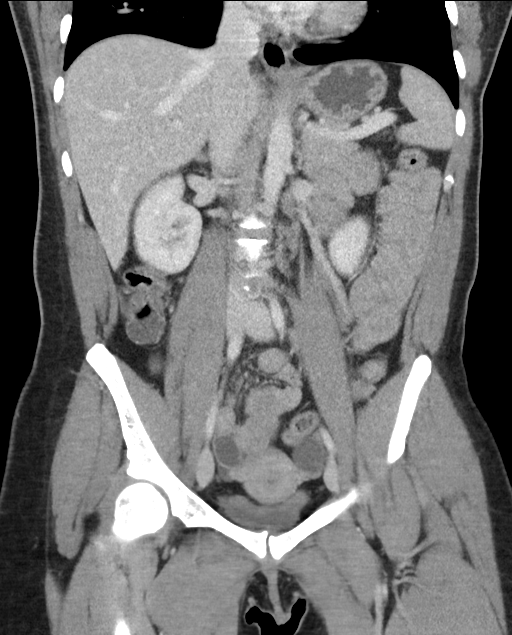
[im 47/85  soft-tissue]
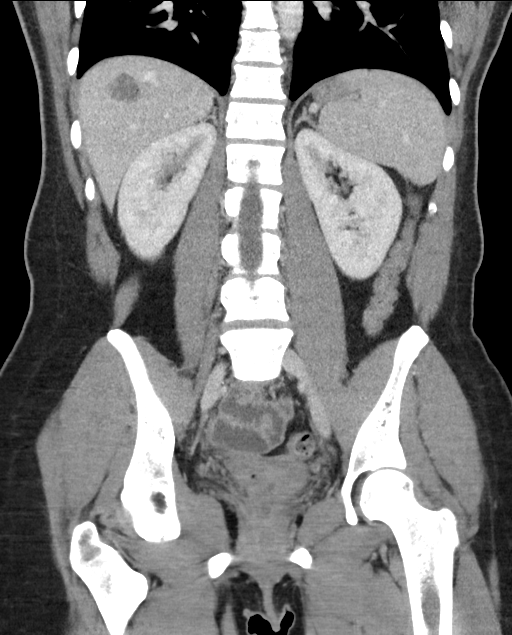

[14 of 46 positions shown; findings below may reference images not displayed]

FINDINGS: Lower chest: Limited visualization of the lower thorax is negative
for focal airspace opacity or pleural effusion. Normal heart size.
No pericardial effusion.

Hepatobiliary: Normal hepatic contour. There is an approximately
x 1.9 x 1.9 cm hypoattenuating lesion within the dome of the right
lobe of the liver which cannot be characterized as a simple hepatic
cyst (37 Hounsfield units). Scattered additional punctate
subcentimeter hypoattenuating hepatic lesions are too small to
accurately characterize though favored to represent hepatic cysts.
The gallbladder is not identified, presumably surgically absent. No
intra or extrahepatic biliary ductal dilatation. No ascites.

Pancreas: Normal appearance of the pancreas.

Spleen: Normal appearance of the spleen.

Adrenals/Urinary Tract: There is symmetric enhancement of the
bilateral kidneys. No evidence of nephrolithiasis on this
postcontrast examination. No discrete renal lesions. No urinary
obstruction or perinephric stranding.

Normal appearance the bilateral adrenal glands. The urinary bladder
is underdistended.

Stomach/Bowel: There is mild fluid distension and potential bowel
wall thickening of several loops of small bowel centered within the
left lower abdomen/pelvis (representative image 46, series 2)
without evidence of enteric obstruction or adjacent mesenteric
stranding, potentially physiologic though conceivably a localized
enteritis could have a similar appearance. The bowel is otherwise
normal in course and caliber without discrete area of wall
thickening. Normal appearance of the terminal ileum and the
appendix. No pneumoperitoneum, pneumatosis or portal venous gas. No
significant hiatal hernia.

Vascular/Lymphatic: Normal caliber of the abdominal aorta. The major
branch vessels of the abdominal aorta appear patent on this non CTA
examination.

Scattered mesenteric lymph nodes are prominent though the majority
are not enlarged by size criteria. The majority mesenteric lymph
nodes are centered within the left mid hemiabdomen with index
mesenteric lymph node measuring 1.1 cm in greatest short axis
diameter (image 28, series 5). Index lymph node within the right
lower abdominal quadrant measures 0.6 cm in greatest short axis
diameter (image 44, series 2).

Reproductive: Note is made of bilateral presumably physiologic
bilateral adnexal cysts the left side measures approximately 2.3 x
1.9 cm while the right measures 2.6 x 1.5 cm (both cysts seen on
image 58, series 2).

Other: Mild diastasis of the rectus abdominal musculature presumably
the sequela of provided history of multiple previous Caesarean
sections.

Musculoskeletal: No acute or aggressive osseous abnormalities. Small
limbus body is noted about the anterior inferior aspect of the L4
vertebral body.
IMPRESSION: 1. Mild distension and potential bowel wall thickening involving
several loops of small bowel within the left mid hemiabdomen with
adjacent presumably reactive borderline enlarged mesenteric lymph
nodes, nonspecific though could be seen in the setting of a
localized enteritis. No evidence of enteric obstruction.
2. Approximately 2.5 cm hepatic lesion, potentially representative
of a minimally complex hepatic cysts though incompletely
characterized on the present examination. Correlation previous
outside examinations (if available), is advised. Otherwise, further
evaluation with abdominal MRI could be performed as indicated.
3. Suspected bilateral physiologic adnexal cysts.

## 2021-10-03 ENCOUNTER — Ambulatory Visit: Payer: BC Managed Care – PPO | Admitting: Family Medicine

## 2021-10-03 ENCOUNTER — Telehealth: Payer: Self-pay | Admitting: *Deleted

## 2021-10-03 ENCOUNTER — Other Ambulatory Visit (HOSPITAL_COMMUNITY)
Admission: RE | Admit: 2021-10-03 | Discharge: 2021-10-03 | Disposition: A | Discharge status: Home / Self Care | Payer: BC Managed Care – PPO | Source: Ambulatory Visit | Attending: Family Medicine | Admitting: Family Medicine

## 2021-10-03 ENCOUNTER — Encounter: Payer: Self-pay | Admitting: Family Medicine

## 2021-10-03 VITALS — BP 110/70 | HR 68 | Ht 59.0 in | Wt 115.0 lb

## 2021-10-03 DIAGNOSIS — Z113 Encounter for screening for infections with a predominantly sexual mode of transmission: Secondary | ICD-10-CM | POA: Diagnosis not present

## 2021-10-03 DIAGNOSIS — Z3009 Encounter for other general counseling and advice on contraception: Secondary | ICD-10-CM

## 2021-10-03 DIAGNOSIS — B351 Tinea unguium: Secondary | ICD-10-CM

## 2021-10-03 DIAGNOSIS — M25511 Pain in right shoulder: Secondary | ICD-10-CM | POA: Diagnosis not present

## 2021-10-03 DIAGNOSIS — M542 Cervicalgia: Secondary | ICD-10-CM

## 2021-10-03 DIAGNOSIS — L309 Dermatitis, unspecified: Secondary | ICD-10-CM | POA: Diagnosis not present

## 2021-10-03 DIAGNOSIS — G8929 Other chronic pain: Secondary | ICD-10-CM

## 2021-10-03 LAB — PREGNANCY, URINE: Preg Test, Ur: NEGATIVE

## 2021-10-03 MED ORDER — CICLOPIROX 8 % EX SOLN: Freq: Every day | CUTANEOUS | 0 refills | Status: DC

## 2021-10-03 MED ORDER — MEDROXYPROGESTERONE ACETATE 150 MG/ML IM SUSP: 150.0000 mg | INTRAMUSCULAR | 4 refills | Status: DC

## 2021-10-03 NOTE — Progress Notes (Signed)
Subjective: CC: Rash, STD testing, contraception counseling, neck pain PCP: Raliegh Ip, DO PNT:IRWERX Bethany Hall is a 30 y.o. female presenting to clinic today for:  1.  Rash Patient reports she does not have a specific visible rash but notes that her skin remains very itchy despite having been treated with ketoconazole shampoo previously.  She would like referral to dermatology  2.  Contraception counseling, STD testing Patient is sexually active with the same partner but unfortunately she found out that he was unfaithful recently.  She would like to have all testing done.  She denies any vaginal discharge.  She does have mild abdominal discomfort but has also struggled with this and needed GI intervention previously.  Menstrual cycles are regular.  She would like to go on Depo-Provera for contraception.  She has been on this previously and tolerated it.  She did not tolerate the low Loestrin FE as it seemingly correlated with the onset of her gastritis.  3.  Foot fungus Patient reports that she has been having issues with recurrence of the fungus on her foot nails.  She would like to get back on the Penlac if possible   ROS: Per HPI  Allergies  Allergen Reactions   Penicillins Rash    Has patient had a PCN reaction causing immediate rash, facial/tongue/throat swelling, SOB or lightheadedness with hypotension: Unknown Has patient had a PCN reaction causing severe rash involving mucus membranes or skin necrosis: Unknown Has patient had a PCN reaction that required hospitalization: Unknown Has patient had a PCN reaction occurring within the last 10 years: No If all of the above answers are "NO", then may proceed with Cephalosporin use. Infantile Reaction.   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 Apply over nail and surrounding skin. Apply daily over previous coat. After seven (7) days, may remove with alcohol and continue cycle., Disp: 6.6 mL, Rfl: 0   clotrimazole-betamethasone (LOTRISONE) cream, Apply 1 application topically 2 (two) times daily. x7-10d, Disp: 30 g, Rfl: 0   omeprazole (PRILOSEC) 20 MG capsule, Take 1 capsule by mouth once daily, Disp: 90 capsule, Rfl: 1 Social History   Socioeconomic History   Marital status: Legally Separated    Spouse name: Not on file   Number of children: Not on file   Years of education: Not on file   Highest education level: Not on file  Occupational History   Not on file  Tobacco Use   Smoking status: Never   Smokeless tobacco: Never  Vaping Use   Vaping Use: Never used  Substance and Sexual Activity   Alcohol use: No    Comment: occ   Drug use: No   Sexual activity: Not Currently    Birth control/protection: None  Other Topics Concern   Not on file  Social History Narrative   Not on file   Social Determinants of Health   Financial Resource Strain: Not on file  Food Insecurity: Not on file  Transportation Needs: Not on file  Physical Activity: Not on file  Stress: Not  on file  Social Connections: Not on file  Intimate Partner Violence: Not on file   Family History  Problem Relation Age of Onset   Diabetes Mother    Heart disease Father    Adrenal disorder Neg Hx     Objective: Office vital signs reviewed. BP 110/70    Pulse 68    Ht 4\' 11"  (1.499 m)    Wt 115 lb (52.2 kg)    SpO2 100%    BMI 23.23 kg/m   Physical Examination:  General: Awake, alert, well-appearing, No acute distress Cardio: regular rate and rhythm, S1S2 heard, no murmurs appreciated Pulm: clear to auscultation bilaterally, no wheezes, rhonchi or  rales; normal work of breathing on room air GU: Mild suprapubic pain  Skin: No discrete rash appreciated MSK:   C-spine: Has some limited range of motion in flexion.  No midline deformity or tenderness to palpation  Right upper extremity: Increased tone.  Has decreased active range of motion in AB duction, extension and internal rotation of the shoulder  Assessment/ Plan: 30 y.o. female   Dermatitis - Plan: Ambulatory referral to Dermatology  Chronic neck pain - Plan: Ambulatory referral to Physical Therapy  Chronic right shoulder pain - Plan: Ambulatory referral to Physical Therapy  Encounter for counseling regarding contraception - Plan: HIV antibody (with reflex), RPR, Hepatitis C antibody, HSV(herpes simplex vrs) 1+2 ab-IgG, Pregnancy, urine, medroxyPROGESTERone (DEPO-PROVERA) 150 MG/ML injection, CANCELED: POCT urine pregnancy  Onychomycosis - Plan: ciclopirox (PENLAC) 8 % solution  Screening for STD (sexually transmitted disease) - Plan: HIV antibody (with reflex), RPR, Hepatitis C antibody, HSV(herpes simplex vrs) 1+2 ab-IgG, Urine cytology ancillary only  Has been treated with ketoconazole with no improvement.  Referral to dermatology placed  For chronic neck and right shoulder pain I placed referral to physical therapy, which has been helpful in the past.  Further follow-up pending her response to PT  Requested STD screening.  Blood panel and urine cytology ordered.  Urine pregnancy ordered and we will order Depo for her.  She will bring the shot back for injection  For her foot fungus, Penlac ordered.  She is responded well to that in the past.  Orders Placed This Encounter  Procedures   HIV antibody (with reflex)   RPR   Hepatitis C antibody   HSV(herpes simplex vrs) 1+2 ab-IgG   Ambulatory referral to Dermatology    Referral Priority:   Routine    Referral Type:   Consultation    Referral Reason:   Specialty Services Required    Requested Specialty:   Dermatology     Number of Visits Requested:   1   Ambulatory referral to Physical Therapy    Referral Priority:   Routine    Referral Type:   Physical Medicine    Referral Reason:   Specialty Services Required    Requested Specialty:   Physical Therapy    Number of Visits Requested:   1   POCT urine pregnancy   Meds ordered this encounter  Medications   ciclopirox (PENLAC) 8 % solution    Sig: Apply topically at bedtime. Apply over nail and surrounding skin. Apply daily over previous coat. After seven (7) days, may remove with alcohol and continue cycle.    Dispense:  6.6 mL    Refill:  0     Tell Rozelle 32, DO [facility] [phone]

## 2021-10-03 NOTE — Telephone Encounter (Signed)
Ciclopirox 8% solution  Key: [id number] Sent to [facility] today

## 2021-10-04 LAB — RPR: RPR Ser Ql: NONREACTIVE

## 2021-10-04 LAB — HSV(HERPES SIMPLEX VRS) I + II AB-IGG
HSV 1 Glycoprotein G Ab, IgG: 31.3 index — ABNORMAL HIGH (ref 0.00–0.90)
HSV 2 IgG, Type Spec: <0.91 index (ref 0.00–0.90)

## 2021-10-04 LAB — HEPATITIS C ANTIBODY: Hep C Virus Ab: 0.1 s/co ratio (ref 0.0–0.9)

## 2021-10-04 LAB — HIV ANTIBODY (ROUTINE TESTING W REFLEX): HIV Screen 4th Generation wRfx: NONREACTIVE

## 2021-10-04 NOTE — Telephone Encounter (Signed)
Deniedon December 28 Your PA request has been denied. Additional information will be provided in the denial communication.

## 2021-10-05 LAB — URINE CYTOLOGY ANCILLARY ONLY
Candida Urine: NEGATIVE
Chlamydia: NEGATIVE
Comment: NEGATIVE
Comment: NEGATIVE
Comment: NORMAL
Neisseria Gonorrhea: NEGATIVE
Trichomonas: NEGATIVE

## 2021-10-05 NOTE — Telephone Encounter (Signed)
The only alternative was the cream she said was ineffective.  Hopefully, the dermatologist can provide some additional insight.

## 2021-10-05 NOTE — Telephone Encounter (Signed)
Patient aware and verbalizes understanding- would like to know if something else can be sent in instead?

## 2021-10-05 NOTE — Telephone Encounter (Signed)
Please inform patient

## 2021-10-05 NOTE — Telephone Encounter (Signed)
Attempted to contact patient - NVM 

## 2021-10-16 ENCOUNTER — Encounter: Payer: Self-pay | Admitting: Family Medicine

## 2021-10-26 ENCOUNTER — Ambulatory Visit: Payer: BC Managed Care – PPO | Admitting: Family Medicine

## 2021-10-26 ENCOUNTER — Encounter: Payer: Self-pay | Admitting: Family Medicine

## 2021-10-26 VITALS — BP 108/74 | HR 68 | Temp 98.1°F | Ht 59.0 in | Wt 125.6 lb

## 2021-10-26 DIAGNOSIS — N926 Irregular menstruation, unspecified: Secondary | ICD-10-CM

## 2021-10-26 DIAGNOSIS — L309 Dermatitis, unspecified: Secondary | ICD-10-CM | POA: Diagnosis not present

## 2021-10-26 DIAGNOSIS — M545 Low back pain, unspecified: Secondary | ICD-10-CM

## 2021-10-26 DIAGNOSIS — N760 Acute vaginitis: Secondary | ICD-10-CM

## 2021-10-26 DIAGNOSIS — B9689 Other specified bacterial agents as the cause of diseases classified elsewhere: Secondary | ICD-10-CM

## 2021-10-26 DIAGNOSIS — N898 Other specified noninflammatory disorders of vagina: Secondary | ICD-10-CM

## 2021-10-26 LAB — MICROSCOPIC EXAMINATION: Renal Epithel, UA: NONE SEEN /hpf

## 2021-10-26 LAB — URINALYSIS, ROUTINE W REFLEX MICROSCOPIC
Bilirubin, UA: NEGATIVE
Glucose, UA: NEGATIVE
Ketones, UA: NEGATIVE
Leukocytes,UA: NEGATIVE
Nitrite, UA: NEGATIVE
Protein,UA: NEGATIVE
Specific Gravity, UA: 1.015 (ref 1.005–1.030)
Urobilinogen, Ur: 0.2 mg/dL (ref 0.2–1.0)
pH, UA: 7 (ref 5.0–7.5)

## 2021-10-26 LAB — WET PREP FOR TRICH, YEAST, CLUE
Clue Cell Exam: POSITIVE — AB
Trichomonas Exam: NEGATIVE
Yeast Exam: NEGATIVE

## 2021-10-26 LAB — PREGNANCY, URINE: Preg Test, Ur: NEGATIVE

## 2021-10-26 MED ORDER — TRIAMCINOLONE ACETONIDE 0.1 % EX CREA: 1.0000 "application " | TOPICAL_CREAM | Freq: Two times a day (BID) | CUTANEOUS | 1 refills | Status: DC

## 2021-10-26 MED ORDER — METRONIDAZOLE 500 MG PO TABS: 500.0000 mg | ORAL_TABLET | Freq: Two times a day (BID) | ORAL | 0 refills | Status: AC

## 2021-10-26 NOTE — Progress Notes (Signed)
Assessment & Plan:  1. Eczema of both hands Education provided on eczema. Encouraged to use wet to dry gloves overnight.  - triamcinolone cream (KENALOG) 0.1 %; Apply 1 application topically 2 (two) times daily.  Dispense: 80 g; Refill: 1  2. Bacterial vaginosis Education provided on bacterial vaginosis. - metroNIDAZOLE (FLAGYL) 500 MG tablet; Take 1 tablet (500 mg total) by mouth 2 (two) times daily for 7 days.  Dispense: 14 tablet; Refill: 0  3. Lumbar pain - Urinalysis, Routine w reflex microscopic (negative)  4. Missed menses - Pregnancy, urine (negative)  5. Vaginal discharge - WET PREP FOR TRICH, YEAST, CLUE (+ for clue cells)   Follow up plan: Return if symptoms worsen or fail to improve.  Deliah Boston, MSN, APRN, FNP-C [facility]  Subjective:   Patient ID: Bethany Hall, female    DOB: December 21, 1990, 31 y.o.   MRN: [id number]  HPI: Bethany Hall is a 31 y.o. female presenting on 10/26/2021 for Rash (Bilateral hands x 5 days- painful and burns ) and Menstrual Problem (Patient states that she will feel like her cycle will come due to cramps and lower back pain but no bleeding.  Last mens was in November.  Has Upreg here on 12/28- negative )  Patient reports her hands redness and burning to the tops of her hands x5 days. 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 This has never occurred before.   Patient is having a watery clear discharge x2 weeks. She had pink discharge when she just went to the restroom. She is experiencing urinary frequency, low back pain, abdominal cramping, and vaginal irritation. At her visit three weeks ago she was negative for gonorrhea, chlamydia, trichomoniasis, hepatitis C, syphilis, and HIV.    ROS: Negative unless specifically indicated above in HPI.   Relevant past medical history reviewed and updated as indicated.   Allergies and medications reviewed and updated.   Current Outpatient Medications:     omeprazole (PRILOSEC) 20 MG capsule, Take 1 capsule by mouth once daily, Disp: 90 capsule, Rfl: 1   medroxyPROGESTERone (DEPO-PROVERA) 150 MG/ML injection, Inject 1 mL (150 mg total) into the muscle every 3 (three) months. Bring to office for administration (Patient not taking: Reported on 10/26/2021), Disp: 1 mL, Rfl: 4  Allergies  Allergen Reactions   Penicillins Rash    Has patient had a PCN reaction causing immediate rash, facial/tongue/throat swelling, SOB or lightheadedness with hypotension: Unknown Has patient had a PCN reaction causing severe rash involving mucus membranes or skin necrosis: Unknown Has patient had a PCN reaction that required hospitalization: Unknown Has patient had a PCN reaction occurring within the last 10 years: No If all of the above answers are "NO", then may proceed with Cephalosporin use. Infantile Reaction.    Objective:   BP 108/74    Pulse 68    Temp 98.1 F (36.7 C) (Temporal)    Ht 4\' 11"  (1.499 m)    Wt 125 lb 9.6 oz (57 kg)    BMI 25.37 kg/m    Physical Exam Vitals reviewed.  Constitutional:      General: She is not in acute distress.    Appearance: Normal appearance. She is not ill-appearing, toxic-appearing or diaphoretic.  HENT:     Head: Normocephalic and atraumatic.  Eyes:     General: No scleral icterus.       Right eye: No discharge.        Left eye: No discharge.  Conjunctiva/sclera: Conjunctivae normal.  Cardiovascular:     Rate and Rhythm: Normal rate.  Pulmonary:     Effort: Pulmonary effort is normal. No respiratory distress.  Abdominal:     Tenderness: There is no right CVA tenderness or left CVA tenderness.  Musculoskeletal:        General: Normal range of motion.     Cervical back: Normal range of motion.     Lumbar back: Tenderness (across low back) present.  Skin:    General: Skin is warm and dry.     Capillary Refill: Capillary refill takes less than 2 seconds.  Neurological:     General: No focal deficit  present.     Mental Status: She is alert and oriented to person, place, and time. Mental status is at baseline.  Psychiatric:        Mood and Affect: Mood normal.        Behavior: Behavior normal.        Thought Content: Thought content normal.        Judgment: Judgment normal.

## 2021-10-29 ENCOUNTER — Encounter: Payer: Self-pay | Admitting: Physical Therapy

## 2021-10-29 ENCOUNTER — Ambulatory Visit: Payer: BC Managed Care – PPO | Attending: Family Medicine | Admitting: Physical Therapy

## 2021-10-29 ENCOUNTER — Encounter: Payer: Self-pay | Admitting: Family Medicine

## 2021-10-29 ENCOUNTER — Other Ambulatory Visit: Payer: Self-pay

## 2021-10-29 DIAGNOSIS — M542 Cervicalgia: Secondary | ICD-10-CM

## 2021-10-29 DIAGNOSIS — G8929 Other chronic pain: Secondary | ICD-10-CM | POA: Diagnosis not present

## 2021-10-29 DIAGNOSIS — M25511 Pain in right shoulder: Secondary | ICD-10-CM | POA: Diagnosis not present

## 2021-10-29 DIAGNOSIS — R293 Abnormal posture: Secondary | ICD-10-CM | POA: Diagnosis present

## 2021-10-29 NOTE — Therapy (Signed)
[facility] [address] [place], [place], [address] Phone: [phone]   Fax:  [phone]  Physical Therapy Evaluation  Patient Details  Name: Bethany SealsJohana Hall MRN: [id number] Date of Birth: 02/13/1991 Referring Provider (PT): Delynn FlavinAshly Gottschalk DO   Encounter Date: 10/29/2021   PT End of Session - 10/29/21 1420     Visit Number 1    Number of Visits 12    Date for PT Re-Evaluation 12/10/21    PT Start Time 0150    PT Stop Time 0235    PT Time Calculation (min) 45 min    Activity Tolerance Patient tolerated treatment well    Behavior During Therapy [facility]: [facility] for tasks assessed/performed             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    Subjective Assessment - 10/29/21 1421     Subjective COVID-19 screen performed prior to patient entering clinic.  The patient presents to the clinci today with a CC of bilateral neck pain and pain into right shoulder.  She states she will have times when her pain is relatively low and tolerable and then flare-ups that seem to occur for no apparent reason.  Her pain is rated at 9/10 today.  She also reports a feeling of stiffness into her UE's.  Lately she has not found really anything decreases her pain.  She has had PT in the past which did help.    Pertinent History Aymoplasia  Congenita    How long can you sit comfortably? Varies.    Patient Stated Goals Decrease pain.    Currently in Pain? Yes    Pain Score 9     Pain Location Neck    Pain Orientation Right;Left    Pain Descriptors / Indicators Aching   Stiff.   Pain Type Chronic pain   Recent exacerbation about 2 weeks ago.   Pain Radiating Towards Right shoulder.    Pain Frequency Constant    Aggravating Factors  See above.    Pain Relieving Factors See above.                [facility] 10/29/21 0001       Assessment   Medical Diagnosis Chronic neck pain.    Referring Provider (PT) Delynn FlavinAshly Gottschalk DO    Onset Date/Surgical Date --   Ongoing with an exacerbation about two weeks ago.     Precautions   Precautions None      Restrictions   Weight Bearing Restrictions No      Balance Screen   Has the patient fallen in the past 6 months No    Has the patient had a decrease in activity level because of a fear of falling?  No    Is  the patient reluctant to leave their home because of a fear of falling?  No      Home Environment   Living Environment Private residence      Prior Function   Level of Independence Independent      Posture/Postural Control   Posture/Postural Control Postural limitations    Postural Limitations Rounded Shoulders;Forward head      Deep Tendon Reflexes   DTR Assessment Site Biceps;Brachioradialis;Triceps    Biceps DTR 0    Brachioradialis DTR 1+    Triceps DTR 1+      ROM / Strength   AROM / PROM / Strength AROM;Strength      AROM   Overall AROM Comments Right active cervical rotation is 80 degrees and left is 60 degrees.  Bilateral SBing is full.  Right shoulder ER is 65 degrees.      Strength   Overall Strength Comments Bilateral shoulder strength grossly graded at 4-/5.      Palpation   Palpation comment C/o tenderness over patient's bilateral cervical paraspinal musculature and bilateral UT's.      Special Tests   Other special tests  Patient had some pain reproduction with a right shoulder impingement test.      Ambulation/Gait   Gait Comments WNL.                        Objective measurements completed on examination: See above findings.       [facility] Adult PT Treatment/Exercise - 10/29/21 0001       Modalities   Modalities Electrical Stimulation;Moist Heat      Moist Heat Therapy   Number Minutes Moist Heat 20 Minutes    Moist Heat Location Cervical      Electrical Stimulation   Electrical Stimulation Location Bilateral cervical/UT's.    Electrical Stimulation Action IFc at 80-150 Hz.    Electrical Stimulation Parameters 40% scan x 20 minutes.    Electrical Stimulation Goals Pain;Tone                          PT Long Term Goals - 10/29/21 1450       PT LONG TERM GOAL #1   Title Patient will be independent with HEP    Time 6    Period Weeks    Status New      PT LONG TERM GOAL #2   Title Patient will demonstrate 80 degrees of active cervical rotation to improve ability to look over shoulder while driving    Time 6    Period Weeks    Status New      PT LONG TERM GOAL #3   Title Patient will demonstrate 4/5 or greater bilateral shoulder MMT in all planes to improve stability during functional tasks.    Time 6    Period Weeks    Status New      PT LONG TERM GOAL #4   Title Patient will report ability to perform all ADLs with pain less than 4/10 in cervical spine.    Time 6    Period Weeks    Status New                    Plan - 10/29/21 1444     Clinical Impression Statement The patient presents to OPP[facility] with chronic neck pain and a recent exacerbation about two weeks ago.  She is found to have diffuse bilateral cervical  musculature tenderness as well as pain over bilatreral UT's.  She has pain radiation into her right shoulder.  She has limited right shoulder ER.  Her PMH is remarkable for Aymoplasia Congenita.  She also lacks active left cervical  active rotation.  She had some pain reproduction with a right shoulder impingement testing.  She exhibits bilateral shoulder weakness.  Patient will benefit from skilled physical therapy intervention to address pain and deficits.    Personal Factors and Comorbidities Comorbidity 1    Comorbidities Aymoplasia Congenita    Examination-Activity Limitations Other    Examination-Participation Restrictions Other    Stability/Clinical Decision Making Evolving/Moderate complexity    Clinical Decision Making Low    Rehab Potential Good    PT Frequency 2x / week    PT Duration 6 weeks    PT Treatment/Interventions ADLs/Self Care Home Management;Cryotherapy;Electrical Stimulation;Ultrasound;Moist Heat;Therapeutic activities;Therapeutic exercise;Manual techniques;Patient/family education;Passive range of motion    PT Next Visit Plan Combo e'stim/US, STW/M, postural exercises, right shoulder RW4.    Consulted and Agree with Plan of Care Patient             Patient will benefit from skilled therapeutic intervention in order to improve the following deficits and impairments:  Decreased activity tolerance, Decreased strength, Decreased range of motion, Increased muscle spasms, Postural dysfunction, Pain  Visit Diagnosis: Cervicalgia - Plan: PT plan of care cert/re-cert  Abnormal posture - Plan: PT plan of care cert/re-cert     Problem List Patient Active Problem List   Diagnosis Date Noted   Hyperpigmentation 01/16/2021   Degenerative disc disease, cervical 10/20/2018   Chronic bilateral thoracic back pain 10/20/2018   Musculoskeletal pain of right upper extremity 10/20/2018   Chronically dry eyes, bilateral 10/20/2018   Dry mouth 10/20/2018    Shantelle Alles, [place], PT 10/29/2021, 2:54 PM  [facility] [address] [place], [place], [address] Phone: [phone]   Fax:  [phone]  Name: Bethany Hall MRN: [id number] Date of Birth:  [birth year]

## 2021-10-30 ENCOUNTER — Telehealth: Payer: Self-pay | Admitting: Family Medicine

## 2021-10-30 ENCOUNTER — Other Ambulatory Visit: Payer: Self-pay | Admitting: Family Medicine

## 2021-10-30 DIAGNOSIS — B359 Dermatophytosis, unspecified: Secondary | ICD-10-CM

## 2021-10-30 MED ORDER — CLOTRIMAZOLE 1 % EX SOLN: 1.0000 "application " | Freq: Two times a day (BID) | CUTANEOUS | 2 refills | Status: DC

## 2021-10-30 NOTE — Telephone Encounter (Signed)
Let pharmacy know that we did send in correct medication today at 7:43am, an external solution not a cream. They agreed they did look at it incorrectly and she is going to get the pharmacist to look at it. After waiting on hold [facility] phamacy worker come back on phone to let me know they are going to fix it.

## 2021-10-30 NOTE — Telephone Encounter (Signed)
Liquid WAS sent to [facility] this am

## 2021-10-30 NOTE — Telephone Encounter (Signed)
done

## 2021-11-05 NOTE — Telephone Encounter (Signed)
Refer to other phone note- this encounter will be closed

## 2021-11-06 ENCOUNTER — Encounter: Payer: Self-pay | Admitting: Physical Therapy

## 2021-11-06 ENCOUNTER — Other Ambulatory Visit: Payer: Self-pay

## 2021-11-06 ENCOUNTER — Ambulatory Visit: Payer: BC Managed Care – PPO | Admitting: Physical Therapy

## 2021-11-06 DIAGNOSIS — R293 Abnormal posture: Secondary | ICD-10-CM

## 2021-11-06 DIAGNOSIS — M542 Cervicalgia: Secondary | ICD-10-CM | POA: Diagnosis not present

## 2021-11-06 NOTE — Therapy (Signed)
[facility] [address] [place], [place], [address] Phone: [phone]   Fax:  [phone]  Physical Therapy Treatment  Patient Details  Name: Arcadia Gorgas MRN: [id number] Date of Birth: 1991-06-16 Referring Provider (PT): Delynn Flavin DO   Encounter Date: 11/06/2021   PT End of Session - 11/06/21 1707     Visit Number 2    Number of Visits 12    Date for PT Re-Evaluation 12/10/21    PT Start Time 1645    PT Stop Time 1725    PT Time Calculation (min) 40 min    Activity Tolerance Patient tolerated treatment well    Behavior During Therapy [facility] for tasks assessed/performed             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   Subjective Assessment - 11/06/21 1644     Subjective COVID-19 screen performed prior to patient entering clinic.  Reports feeling achey in neck and low back.    Pertinent History Aymoplasia Congenita    How long can you sit comfortably? Varies.    Patient Stated Goals Decrease pain.    Currently in Pain? Yes    Pain Location Neck    Pain Orientation Right;Left    Pain Descriptors / Indicators Aching;Tightness;Discomfort    Pain Type Chronic pain    Pain Onset 1 to 4 weeks ago    Pain Frequency Constant                [facility] PT Assessment - 11/06/21 0001       Assessment    Medical Diagnosis Chronic neck pain.    Referring Provider (PT) Delynn Flavin DO      Precautions   Precautions None      Restrictions   Weight Bearing Restrictions No                           [facility] Adult PT Treatment/Exercise - 11/06/21 0001       Modalities   Modalities Ultrasound;Geologist, engineering Location Bilateral cervical/UT's.    Electrical Stimulation Action Pre-Mod    Electrical Stimulation Parameters 80-150 hz x76min    Electrical Stimulation Goals Pain;Tone      Ultrasound   Ultrasound Location B UT    Ultrasound Parameters Combo 1.5 w/cm2, 100%, 1 mhz x10 min    Ultrasound Goals Pain      Manual Therapy   Manual Therapy Soft tissue mobilization    Soft tissue mobilization STW to B UT, cervical paraspinals                          PT Long Term Goals -  10/29/21 1450       PT LONG TERM GOAL #1   Title Patient will be independent with HEP    Time 6    Period Weeks    Status New      PT LONG TERM GOAL #2   Title Patient will demonstrate 80 degrees of active cervical rotation to improve ability to look over shoulder while driving    Time 6    Period Weeks    Status New      PT LONG TERM GOAL #3   Title Patient will demonstrate 4/5 or greater bilateral shoulder MMT in all planes to improve stability during functional tasks.    Time 6    Period Weeks    Status New      PT LONG TERM GOAL #4   Title Patient will report ability to perform all ADLs with pain less than 4/10 in cervical spine.    Time 6    Period Weeks    Status New                   Plan - 11/06/21 1722     Clinical Impression Statement Patient presented in clinic with reports of achiness and tightness along B UT and cervical paraspinals region but especially L. Patient feels as if it is pressure pushing down along her shoulders. Moderate tone notable in L UT and cervical  paraspinals. Patient became very hot and pale during STW which required a rest break and cold water to regain composure. Normal modalities response noted following removal of the modalities. Patient reported feeling much better following end of treatment.    Personal Factors and Comorbidities Comorbidity 1    Comorbidities Aymoplasia Congenita    Examination-Activity Limitations Other    Examination-Participation Restrictions Other    Stability/Clinical Decision Making Evolving/Moderate complexity    Rehab Potential Good    PT Frequency 2x / week    PT Duration 6 weeks    PT Treatment/Interventions ADLs/Self Care Home Management;Cryotherapy;Electrical Stimulation;Ultrasound;Moist Heat;Therapeutic activities;Therapeutic exercise;Manual techniques;Patient/family education;Passive range of motion    PT Next Visit Plan Combo e'stim/US, STW/M, postural exercises, right shoulder RW4.    Consulted and Agree with Plan of Care Patient             Patient will benefit from skilled therapeutic intervention in order to improve the following deficits and impairments:  Decreased activity tolerance, Decreased strength, Decreased range of motion, Increased muscle spasms, Postural dysfunction, Pain  Visit Diagnosis: Cervicalgia  Abnormal posture     Problem List Patient Active Problem List   Diagnosis Date Noted   Hyperpigmentation 01/16/2021   Degenerative disc disease, cervical 10/20/2018   Chronic bilateral thoracic back pain 10/20/2018   Musculoskeletal pain of right upper extremity 10/20/2018   Chronically dry eyes, bilateral 10/20/2018   Dry mouth 10/20/2018    Marvell Fuller, PTA 11/06/2021, 5:34 PM  [facility] [address] [place], [place], [address] Phone: [phone]   Fax:  [phone]  Name: Micheal Sheen MRN: [id number] Date of Birth: 1990/12/13

## 2021-11-22 ENCOUNTER — Encounter: Payer: Self-pay | Admitting: Physical Therapy

## 2021-11-22 ENCOUNTER — Other Ambulatory Visit: Payer: Self-pay | Admitting: Family Medicine

## 2021-11-22 ENCOUNTER — Ambulatory Visit: Payer: BC Managed Care – PPO | Attending: Family Medicine | Admitting: Physical Therapy

## 2021-11-22 DIAGNOSIS — R293 Abnormal posture: Secondary | ICD-10-CM | POA: Insufficient documentation

## 2021-11-22 DIAGNOSIS — M542 Cervicalgia: Secondary | ICD-10-CM | POA: Diagnosis not present

## 2021-11-22 DIAGNOSIS — L309 Dermatitis, unspecified: Secondary | ICD-10-CM

## 2021-11-22 NOTE — Therapy (Addendum)
[facility] [place], [place], [address] Phone: [phone]   Fax:  [phone]  Physical Therapy Treatment  Patient Details  Name: Bethany Hall MRN: [id number] Date of Birth: 08-12-1991 Referring Provider (PT): Ronnie Doss DO   Encounter Date: 11/22/2021   PT End of Session - 11/22/21 1647     Visit Number 3    Number of Visits 12    Date for PT Re-Evaluation 12/10/21    PT Start Time 1779    PT Stop Time 1722    PT Time Calculation (min) 35 min    Activity Tolerance Patient tolerated treatment well    Behavior During Therapy [facility] for tasks assessed/performed             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   Subjective Assessment - 11/22/21 1645     Subjective COVID-19 screen performed prior to patient entering clinic. Reports stiffness in cervical spine but shooting from from low back and into RLE. intermittant headaches.    Pertinent History Aymoplasia Congenita    How long can you sit comfortably? Varies.    Patient Stated Goals Decrease pain.    Currently in Pain? No/denies                [facility] PT Assessment - 11/22/21 0001       Assessment   Medical Diagnosis Chronic neck pain.    Referring Provider (PT) Ronnie Doss DO      Precautions   Precautions None                            [facility] Adult PT Treatment/Exercise - 11/22/21 0001       Exercises   Exercises Neck;Shoulder      Neck Exercises: Machines for Strengthening   UBE (Upper Arm Bike) 120 RPM x6 min (forward/backward)      Neck Exercises: Theraband   Scapula Retraction 15 reps;Red    Shoulder Extension 15 reps;Red    Horizontal ABduction 15 reps;Red      Shoulder Exercises: Sidelying   Other Sidelying Exercises B open book x15 reps      Shoulder Exercises: ROM/Strengthening   Wall Pushups 15 reps    "W" Arms x15 reps    Other ROM/Strengthening Exercises Snow angels x15 reps      Modalities   Modalities Teacher, English as a foreign language Location B [place]    Electrical Stimulation Action Pre-Mod    Electrical Stimulation Parameters 80-150 hz x10 min    Electrical Stimulation Goals Pain;Tone      Manual Therapy   Manual Therapy Manual Traction;Passive ROM    Passive ROM Passive B cervical rotation with gentle  holds    Manual Traction Mild manual cervical traction with light holds to reduce stiffness                          PT Long Term Goals - 10/29/21 1450       PT LONG TERM GOAL #1   Title Patient will be independent with HEP    Time 6    Period Weeks    Status New      PT LONG TERM GOAL #2   Title Patient will demonstrate 80 degrees of active cervical rotation to improve ability to look over shoulder while driving    Time 6    Period Weeks    Status New      PT LONG TERM GOAL #3   Title Patient will demonstrate 4/5 or greater bilateral shoulder MMT in all planes to improve stability during functional tasks.    Time 6    Period Weeks    Status New      PT LONG TERM GOAL #4   Title Patient will report ability to perform all ADLs with pain less than 4/10 in cervical spine.    Time 6    Period Weeks    Status New                   Plan - 11/22/21 1720     Clinical  Impression Statement Patient presented in clinic with reports of cervical stiffness as well as intermittant LBP and shooting pains. Patient progressed through postural strengthening exercises with intermittant cueing in order to emphasize posture and technique. Patient reported an upper thoracic ache with the resisted postural exercises. Mild limitation with R cervical rotation deficit. No complaints during mild manual cervical traction today. Normal stimulation response noted following removal of the modality. Patient reported feeling some better following end of treatment.    Personal Factors and Comorbidities Comorbidity 1    Comorbidities Aymoplasia Congenita    Examination-Activity Limitations Other    Examination-Participation Restrictions Other    Stability/Clinical Decision Making Evolving/Moderate complexity    Rehab Potential Good    PT Frequency 2x / week    PT Duration 6 weeks    PT Treatment/Interventions ADLs/Self Care Home Management;Cryotherapy;Electrical Stimulation;Ultrasound;Moist Heat;Therapeutic activities;Therapeutic exercise;Manual techniques;Patient/family education;Passive range of motion    PT Next Visit Plan Combo e'stim/US, STW/M, postural exercises, right shoulder RW4.    Consulted and Agree with Plan of Care Patient             Patient will benefit from skilled therapeutic intervention in order to improve the following deficits and impairments:  Decreased activity tolerance, Decreased strength, Decreased range of motion, Increased muscle spasms, Postural dysfunction, Pain  Visit Diagnosis: Cervicalgia  Abnormal posture     Problem List Patient Active Problem List   Diagnosis Date Noted   Hyperpigmentation 01/16/2021   Degenerative disc disease, cervical 10/20/2018   Chronic bilateral thoracic back pain 10/20/2018   Musculoskeletal pain of right upper extremity 10/20/2018   Chronically dry eyes, bilateral 10/20/2018   Dry mouth 10/20/2018    Standley Brooking, PTA 11/22/2021, 5:27 PM  [facility] [address] [place], [place], [address] Phone: [phone]   Fax:  [phone]  Name: Bethany Hall MRN: [id number] Date of Birth: 10-13-1990 PHYSICAL THERAPY DISCHARGE SUMMARY  Visits from Start of Care: 3.  Current functional level related to goals / functional outcomes: See above.   Remaining deficits:  See below.   Education / Equipment: HEP.   Patient agrees to discharge. Patient goals were not met. Patient is being discharged due to not returning since the last visit.    Mali Applegate MPT

## 2022-01-10 ENCOUNTER — Ambulatory Visit (INDEPENDENT_AMBULATORY_CARE_PROVIDER_SITE_OTHER): Payer: BC Managed Care – PPO | Admitting: Family Medicine

## 2022-01-10 ENCOUNTER — Encounter: Payer: Self-pay | Admitting: Family Medicine

## 2022-01-10 DIAGNOSIS — N760 Acute vaginitis: Secondary | ICD-10-CM | POA: Diagnosis not present

## 2022-01-10 DIAGNOSIS — R399 Unspecified symptoms and signs involving the genitourinary system: Secondary | ICD-10-CM | POA: Diagnosis not present

## 2022-01-10 LAB — MICROSCOPIC EXAMINATION
Bacteria, UA: NONE SEEN
Epithelial Cells (non renal): NONE SEEN /hpf (ref 0–10)
RBC, Urine: NONE SEEN /hpf (ref 0–2)
Renal Epithel, UA: NONE SEEN /hpf
WBC, UA: NONE SEEN /hpf (ref 0–5)

## 2022-01-10 LAB — URINALYSIS, ROUTINE W REFLEX MICROSCOPIC
Bilirubin, UA: NEGATIVE
Glucose, UA: NEGATIVE
Nitrite, UA: NEGATIVE
Protein,UA: NEGATIVE
RBC, UA: NEGATIVE
Specific Gravity, UA: 1.02 (ref 1.005–1.030)
Urobilinogen, Ur: 0.2 mg/dL (ref 0.2–1.0)
pH, UA: 7 (ref 5.0–7.5)

## 2022-01-10 MED ORDER — FLUCONAZOLE 150 MG PO TABS: ORAL_TABLET | ORAL | 0 refills | Status: DC

## 2022-01-10 NOTE — Progress Notes (Signed)
? ?  Virtual Visit  Note ?Due to COVID-19 pandemic this visit was conducted virtually. This visit type was conducted due to national recommendations for restrictions regarding the COVID-19 Pandemic (e.g. social distancing, sheltering in place) in an effort to limit this patient's exposure and mitigate transmission in our community. All issues noted in this document were discussed and addressed.  A physical exam was not performed with this format. ? ?I connected with Bethany Hall on 01/10/22 at 1433 by telephone and verified that I am speaking with the correct person using two identifiers. Bethany Hall is currently located in her car and no one is currently with her during the visit. The provider, Gabriel Earing, FNP is located in their office at time of visit. ? ?I discussed the limitations, risks, security and privacy concerns of performing an evaluation and management service by telephone and the availability of in person appointments. I also discussed with the patient that there may be a patient responsible charge related to this service. The patient expressed understanding and agreed to proceed. ? ?CC: UTI symptoms ? ?History and Present Illness: ? ?HPI ?Bethany Hall reports dysuria x 1 day with urgency. She also reports vaginal irritation and itching. She denies fever, discharge, flank pain, or hematuria.  ? ? ? ?ROS ?As per HPI.  ? ?Observations/Objective: ?Alert and oriented x 3. Able to speak in full sentences without difficulty.  ? ?Urine dipstick shows negative for all components.  Micro exam: negative for WBC's or RBC's. ? ? ?Assessment and Plan: ?Diagnoses and all orders for this visit: ? ?UTI symptoms ?Negative UA. Culture is pending.  ?-     Urinalysis, Routine w reflex microscopic ?-     Urine Culture ? ?Acute vaginitis ?? Yeast. Diflucan ordered.  ?-     fluconazole (DIFLUCAN) 150 MG tablet; Take one tablet by mouth now. May repeat in 3 days if symptoms persist. ? ? ?Discussed will need to be  evaluated in person if symptoms persist or worsen.  ? ? ?Follow Up Instructions: ?Return to office for new or worsening symptoms, or if symptoms persist.  ? ?  ?I discussed the assessment and treatment plan with the patient. The patient was provided an opportunity to ask questions and all were answered. The patient agreed with the plan and demonstrated an understanding of the instructions. ?  ?The patient was advised to call back or seek an in-person evaluation if the symptoms worsen or if the condition fails to improve as anticipated. ? ?The above assessment and management plan was discussed with the patient. The patient verbalized understanding of and has agreed to the management plan. Patient is aware to call the clinic if symptoms persist or worsen. Patient is aware when to return to the clinic for a follow-up visit. Patient educated on when it is appropriate to go to the emergency department.  ? ?Time call ended:  1444 ? ?I provided 11 minutes of  non face-to-face time during this encounter. ? ? ? ?Gabriel Earing, FNP ? ? ?

## 2022-01-12 LAB — URINE CULTURE

## 2022-02-02 ENCOUNTER — Other Ambulatory Visit: Payer: Self-pay | Admitting: Family Medicine

## 2022-02-02 DIAGNOSIS — K649 Unspecified hemorrhoids: Secondary | ICD-10-CM

## 2022-02-02 DIAGNOSIS — K219 Gastro-esophageal reflux disease without esophagitis: Secondary | ICD-10-CM

## 2022-03-09 ENCOUNTER — Other Ambulatory Visit: Payer: Self-pay | Admitting: Family Medicine

## 2022-03-09 DIAGNOSIS — N926 Irregular menstruation, unspecified: Secondary | ICD-10-CM

## 2022-03-20 ENCOUNTER — Encounter: Payer: Self-pay | Admitting: Family Medicine

## 2022-03-20 ENCOUNTER — Ambulatory Visit (INDEPENDENT_AMBULATORY_CARE_PROVIDER_SITE_OTHER): Payer: BC Managed Care – PPO | Admitting: Family Medicine

## 2022-03-20 VITALS — BP 96/65 | HR 79 | Temp 98.3°F | Ht 59.0 in | Wt 124.6 lb

## 2022-03-20 DIAGNOSIS — H01001 Unspecified blepharitis right upper eyelid: Secondary | ICD-10-CM

## 2022-03-20 DIAGNOSIS — H01004 Unspecified blepharitis left upper eyelid: Secondary | ICD-10-CM | POA: Diagnosis not present

## 2022-03-20 MED ORDER — TOBRAMYCIN-DEXAMETHASONE 0.3-0.1 % OP SUSP: OPHTHALMIC | 0 refills | Status: DC

## 2022-03-20 MED ORDER — SULFAMETHOXAZOLE-TRIMETHOPRIM 800-160 MG PO TABS: 1.0000 | ORAL_TABLET | Freq: Two times a day (BID) | ORAL | 0 refills | Status: DC

## 2022-03-20 NOTE — Progress Notes (Signed)
Chief Complaint  Patient presents with   Belepharitis    BILATERAL    HPI  Patient presents today for onset upon awakening this morning with white mattering in the corners of each eye with drainage.  It did wake her once during the night.  However she was in her normal state of health when she went to bed last night.  She has had no fever chills or sweats.  She does have a mild sore throat.  No earaches.  There is a dry cough.  PMH: Smoking status noted ROS: Per HPI  Objective: BP 96/65   Pulse 79   Temp 98.3 F (36.8 C)   Ht 4\' 11"  (1.499 m)   Wt 124 lb 9.6 oz (56.5 kg)   SpO2 99%   BMI 25.17 kg/m  Gen: NAD, alert, cooperative with exam HEENT: NCAT, EOMI, PERRL there is mild bilateral conjunctival injection.  Both upper lids have moderate chemosis with erythema. Resp: CTABL, no wheezes, non-labored Neuro: Alert and oriented, No gross deficits  Assessment and plan:  1. Blepharitis of upper eyelids of both eyes, unspecified type     Meds ordered this encounter  Medications   tobramycin-dexamethasone (TOBRADEX) ophthalmic solution    Sig: Apply 1 drop in affected eye(s) every 2 hours for two days. Then every 4 hours for 5 days.    Dispense:  5 mL    Refill:  0   sulfamethoxazole-trimethoprim (BACTRIM DS) 800-160 MG tablet    Sig: Take 1 tablet by mouth 2 (two) times daily. Until gone, for infection    Dispense:  20 tablet    Refill:  0    No orders of the defined types were placed in this encounter.   Follow up as needed.  , MD

## 2022-03-22 ENCOUNTER — Ambulatory Visit: Payer: BC Managed Care – PPO | Admitting: Family Medicine

## 2022-03-26 ENCOUNTER — Encounter: Payer: Self-pay | Admitting: Family Medicine

## 2022-03-26 ENCOUNTER — Other Ambulatory Visit (HOSPITAL_COMMUNITY)
Admission: RE | Admit: 2022-03-26 | Discharge: 2022-03-26 | Disposition: A | Discharge status: Home / Self Care | Payer: BC Managed Care – PPO | Source: Ambulatory Visit | Attending: Family Medicine | Admitting: Family Medicine

## 2022-03-26 ENCOUNTER — Ambulatory Visit (INDEPENDENT_AMBULATORY_CARE_PROVIDER_SITE_OTHER): Payer: BC Managed Care – PPO | Admitting: Family Medicine

## 2022-03-26 VITALS — BP 102/73 | HR 75 | Temp 97.9°F | Ht 59.0 in | Wt 122.8 lb

## 2022-03-26 DIAGNOSIS — Z113 Encounter for screening for infections with a predominantly sexual mode of transmission: Secondary | ICD-10-CM | POA: Insufficient documentation

## 2022-03-26 DIAGNOSIS — Z3009 Encounter for other general counseling and advice on contraception: Secondary | ICD-10-CM

## 2022-03-26 DIAGNOSIS — K5904 Chronic idiopathic constipation: Secondary | ICD-10-CM

## 2022-03-26 MED ORDER — LO LOESTRIN FE 1 MG-10 MCG / 10 MCG PO TABS: 1.0000 | ORAL_TABLET | Freq: Every day | ORAL | 4 refills | Status: DC

## 2022-03-26 MED ORDER — TRULANCE 3 MG PO TABS: 1.0000 | ORAL_TABLET | Freq: Every day | ORAL | 0 refills | Status: DC

## 2022-03-26 NOTE — Progress Notes (Signed)
Subjective: CC: STI screening/ OCPs PCP: Raliegh Ip, DO ZJQ:BHALPF Gali is a 30 y.o. female presenting to clinic today for:  1. STI screening Patient is sexually active with one female partner.  She has been with the same partner for some time now.  She wants to be screened for STIs.  Denies any pelvic pain.  She had some clear vaginal discharge not too long ago.  No reports of vaginal odors, itching.  No abnormal vaginal bleeding but she notes that her periods really are not very consistent.  Sometimes they only last a few days but sometimes they are longer.  Was previously on Depo-Provera but has not been on that for a while now.  She is interested in transitioning over to OCPs.  Previously treated with low Loestrin and she would like to go back to that.  Her last unprotected intercourse was 1 week ago.  No known STI exposures.  2.  Constipation Patient reports that she often has to strain.  She denies any pellet stools and she is able to move her bowel movements but notes that she often will have hemorrhoid development as a result.  She uses a topical in efforts to improve the hemorrhoids.  Denies any rectal bleeding.  Has been on MiraLAX and various OTCs in the past without much improvement in symptoms.   ROS: Per HPI  Allergies  Allergen Reactions   Penicillins Rash    Has patient had a PCN reaction causing immediate rash, facial/tongue/throat swelling, SOB or lightheadedness with hypotension: Unknown Has patient had a PCN reaction causing severe rash involving mucus membranes or skin necrosis: Unknown Has patient had a PCN reaction that required hospitalization: Unknown Has patient had a PCN reaction occurring within the last 10 years: No If all of the above answers are "NO", then may proceed with Cephalosporin use. Infantile Reaction.   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 To feet for 4-6 weeks., Disp: 60 mL, Rfl: 2   omeprazole (PRILOSEC) 20 MG capsule, Take 1 capsule by mouth once daily, Disp: 90 capsule, Rfl: 0 Social History   Socioeconomic History   Marital status: Legally Separated    Spouse name: Not on file   Number of children: Not on file   Years of education: Not on file   Highest education level: Not on file  Occupational History   Not on file  Tobacco Use   Smoking status: Never   Smokeless tobacco: Never  Vaping Use   Vaping Use: Never used  Substance and Sexual Activity   Alcohol use: No    Comment: occ   Drug use: No   Sexual activity: Not Currently    Birth control/protection: None  Other Topics Concern   Not on file  Social History Narrative   Not on file   Social Determinants of Health   Financial Resource Strain: Not on file  Food Insecurity: Not on file  Transportation Needs: Not on file  Physical Activity: Not on file  Stress: Not  on file  Social Connections: Not on file  Intimate Partner Violence: Not on file   Family History  Problem Relation Age of Onset   Diabetes Mother    Heart disease Father    Adrenal disorder Neg Hx     Objective: Office vital signs reviewed. BP 102/73   Pulse 75   Temp 97.9 F (36.6 C)   Ht 4\' 11"  (1.499 m)   Wt 122 lb 12.8 oz (55.7 kg)   SpO2 100%   BMI 24.80 kg/m   Physical Examination:  General: Awake, alert, well nourished, No acute distress HEENT: sclera white, MMM Cardio: regular rate and rhythm  Pulm:  normal work of breathing on room air GI: soft, non-tender, non-distended, bowel sounds present x4, no hepatomegaly, no splenomegaly, no masses GU:  external vaginal tissue normal, cervix retroverted and high, no punctate lesions on cervix appreciated, moderate bloody discharge from cervical os  No results found for this or any previous visit (from the past 24 hour(s)).  Patient's last menstrual period was 03/26/2022 (exact date).   Assessment/ Plan: 31 y.o. female   Encounter for other general counseling and advice on contraception - Plan: Pregnancy, urine, Norethindrone-Ethinyl Estradiol-Fe Biphas (LO LOESTRIN FE) 1 MG-10 MCG / 10 MCG tablet  Screening for STD (sexually transmitted disease) - Plan: HIV antibody (with reflex), Hepatitis C antibody, RPR, HSV(herpes simplex vrs) 1+2 ab-IgG, WET PREP FOR TRICH, YEAST, CLUE, GC/Chlamydia probe amp ([place])not at [facility]  Chronic idiopathic constipation - Plan: Plecanatide (TRULANCE) 3 MG TABS  We will put back on her previous birth control pill.  Rx has been sent to pharmacy.  She was actively having a menstrual cycle but urine pregnancy was obtained for completion given recent intercourse that was unprotected 1 week ago  STI screening both vaginally and biopsy was obtained today.  Trial of Trulance and Linzess.  Have given her an 8-day supply of each of these medications.  She will contact me and let me know if either of these are effective and I will be glad to prescribe since symptoms are refractory to OTC treatments   No orders of the defined types were placed in this encounter.  No orders of the defined types were placed in this encounter.    [facility], DO [facility] [phone]

## 2022-03-27 LAB — WET PREP FOR TRICH, YEAST, CLUE
Clue Cell Exam: NEGATIVE
Trichomonas Exam: NEGATIVE
Yeast Exam: NEGATIVE

## 2022-03-27 LAB — HSV(HERPES SIMPLEX VRS) I + II AB-IGG
HSV 1 Glycoprotein G Ab, IgG: 29.7 index — ABNORMAL HIGH (ref 0.00–0.90)
HSV 2 IgG, Type Spec: <0.91 index (ref 0.00–0.90)

## 2022-03-27 LAB — HIV ANTIBODY (ROUTINE TESTING W REFLEX): HIV Screen 4th Generation wRfx: NONREACTIVE

## 2022-03-27 LAB — HEPATITIS C ANTIBODY: Hep C Virus Ab: NONREACTIVE

## 2022-03-27 LAB — RPR: RPR Ser Ql: NONREACTIVE

## 2022-03-27 LAB — PREGNANCY, URINE: Preg Test, Ur: NEGATIVE

## 2022-03-28 LAB — GC/CHLAMYDIA PROBE AMP (~~LOC~~) NOT AT ARMC
Chlamydia: NEGATIVE
Comment: NEGATIVE
Comment: NORMAL
Neisseria Gonorrhea: NEGATIVE

## 2022-04-25 ENCOUNTER — Other Ambulatory Visit: Payer: Self-pay | Admitting: Family Medicine

## 2022-04-25 DIAGNOSIS — K219 Gastro-esophageal reflux disease without esophagitis: Secondary | ICD-10-CM

## 2022-05-09 ENCOUNTER — Other Ambulatory Visit: Payer: Self-pay | Admitting: Gastroenterology

## 2022-05-09 ENCOUNTER — Other Ambulatory Visit (HOSPITAL_COMMUNITY): Payer: Self-pay | Admitting: Gastroenterology

## 2022-05-09 DIAGNOSIS — K529 Noninfective gastroenteritis and colitis, unspecified: Secondary | ICD-10-CM

## 2022-08-12 ENCOUNTER — Other Ambulatory Visit: Payer: Self-pay | Admitting: Family Medicine

## 2022-08-12 DIAGNOSIS — F411 Generalized anxiety disorder: Secondary | ICD-10-CM

## 2022-08-12 DIAGNOSIS — F321 Major depressive disorder, single episode, moderate: Secondary | ICD-10-CM

## 2022-10-09 ENCOUNTER — Other Ambulatory Visit: Payer: Self-pay | Admitting: Family Medicine

## 2022-10-09 DIAGNOSIS — F411 Generalized anxiety disorder: Secondary | ICD-10-CM

## 2022-10-09 DIAGNOSIS — F321 Major depressive disorder, single episode, moderate: Secondary | ICD-10-CM

## 2022-10-12 ENCOUNTER — Other Ambulatory Visit: Payer: Self-pay | Admitting: Family Medicine

## 2022-10-12 DIAGNOSIS — F411 Generalized anxiety disorder: Secondary | ICD-10-CM

## 2022-10-12 DIAGNOSIS — F321 Major depressive disorder, single episode, moderate: Secondary | ICD-10-CM

## 2022-10-25 ENCOUNTER — Ambulatory Visit: Payer: BC Managed Care – PPO | Admitting: Family Medicine

## 2022-10-25 ENCOUNTER — Encounter: Payer: Self-pay | Admitting: Family Medicine

## 2022-10-25 VITALS — BP 95/59 | HR 80 | Temp 97.7°F

## 2022-10-25 DIAGNOSIS — R350 Frequency of micturition: Secondary | ICD-10-CM | POA: Diagnosis not present

## 2022-10-25 DIAGNOSIS — Z113 Encounter for screening for infections with a predominantly sexual mode of transmission: Secondary | ICD-10-CM

## 2022-10-25 DIAGNOSIS — F322 Major depressive disorder, single episode, severe without psychotic features: Secondary | ICD-10-CM

## 2022-10-25 DIAGNOSIS — B353 Tinea pedis: Secondary | ICD-10-CM

## 2022-10-25 DIAGNOSIS — N898 Other specified noninflammatory disorders of vagina: Secondary | ICD-10-CM | POA: Diagnosis not present

## 2022-10-25 DIAGNOSIS — F411 Generalized anxiety disorder: Secondary | ICD-10-CM

## 2022-10-25 LAB — URINALYSIS, ROUTINE W REFLEX MICROSCOPIC
Bilirubin, UA: NEGATIVE
Glucose, UA: NEGATIVE
Ketones, UA: NEGATIVE
Leukocytes,UA: NEGATIVE
Nitrite, UA: NEGATIVE
Protein,UA: NEGATIVE
RBC, UA: NEGATIVE
Specific Gravity, UA: 1.025 (ref 1.005–1.030)
Urobilinogen, Ur: 0.2 mg/dL (ref 0.2–1.0)
pH, UA: 6.5 (ref 5.0–7.5)

## 2022-10-25 MED ORDER — CLOTRIMAZOLE-BETAMETHASONE 1-0.05 % EX CREA: 1.0000 | TOPICAL_CREAM | Freq: Every day | CUTANEOUS | 0 refills | Status: DC

## 2022-10-25 MED ORDER — ESCITALOPRAM OXALATE 10 MG PO TABS: 10.0000 mg | ORAL_TABLET | Freq: Every day | ORAL | 0 refills | Status: DC
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
## 2022-10-25 NOTE — Patient Instructions (Signed)
Managing Depression, Adult Depression is a mental health condition that affects your thoughts, feelings, and actions. Being diagnosed with depression can bring you relief if you did not know why you have felt or behaved a certain way. It could also leave you feeling overwhelmed. Finding ways to manage your symptoms can help you feel more positive about your future. How to manage lifestyle changes Being depressed is difficult. Depression can increase the level of everyday stress. Stress can make depression symptoms worse. You may believe your symptoms cannot be managed or will never improve. However, there are many things you can try to help manage your symptoms. There is hope. Managing stress  Stress is your body's reaction to life changes and events, both good and bad. Stress can add to your feelings of depression. Learning to manage your stress can help lessen your feelings of depression. Try some of the following approaches to reducing your stress (stress reduction techniques): Listen to music that you enjoy and that inspires you. Try using a meditation app or take a meditation class. Develop a practice that helps you connect with your spiritual self. Walk in nature, pray, or go to a place of worship. Practice deep breathing. To do this, inhale slowly through your nose. Pause at the top of your inhale for a few seconds and then exhale slowly, letting yourself relax. Repeat this three or four times. Practice yoga to help relax and work your muscles. Choose a stress reduction technique that works for you. These techniques take time and practice to develop. Set aside 5-15 minutes a day to do them. Therapists can offer training in these techniques. Do these things to help manage stress: Keep a journal. Know your limits. Set healthy boundaries for yourself and others, such as saying "no" when you think something is too much. Pay attention to how you react to certain situations. You may not be able to  control everything, but you can change your reaction. Add humor to your life by watching funny movies or shows. Make time for activities that you enjoy and that relax you. Spend less time using electronics, especially at night before bed. The light from screens can make your brain think it is time to get up rather than go to bed.  Medicines Medicines, such as antidepressants, are often a part of treatment for depression. Talk with your pharmacist or health care provider about all the medicines, supplements, and herbal products that you take, their possible side effects, and what medicines and other products are safe to take together. Make sure to report any side effects you may have to your health care provider. Relationships Your health care provider may suggest family therapy, couples therapy, or individual therapy as part of your treatment. How to recognize changes Everyone responds differently to treatment for depression. As you recover from depression, you may start to: Have more interest in doing activities. Feel more hopeful. Have more energy. Eat a more regular amount of food. Have better mental focus. It is important to recognize if your depression is not getting better or is getting worse. The symptoms you had in the beginning may return, such as: Feeling tired. Eating too much or too little. Sleeping too much or too little. Feeling restless, agitated, or hopeless. Trouble focusing or making decisions. Having unexplained aches and pains. Feeling irritable, angry, or aggressive. If you or your family members notice these symptoms coming back, let your health care provider know right away. Follow these instructions at home: Activity Try to   get some form of exercise each day, such as walking. Try yoga, mindfulness, or other stress reduction techniques. Participate in group activities if you are able. Lifestyle Get enough sleep. Cut down on or stop using caffeine, tobacco,  alcohol, and any other harmful substances. Eat a healthy diet that includes plenty of vegetables, fruits, whole grains, low-fat dairy products, and lean protein. Limit foods that are high in solid fats, added sugar, or salt (sodium). General instructions Take over-the-counter and prescription medicines only as told by your health care provider. Keep all follow-up visits. It is important for your health care provider to check on your mood, behavior, and medicines. Your health care provider may need to make changes to your treatment. Where to find support Talking to others  Friends and family members can be sources of support and guidance. Talk to trusted friends or family members about your condition. Explain your symptoms and let them know that you are working with a health care provider to treat your depression. Tell friends and family how they can help. Finances Find mental health providers that fit with your financial situation. Talk with your health care provider if you are worried about access to food, housing, or medicine. Call your insurance company to learn about your co-pays and prescription plan. Where to find more information You can find support in your area from: Anxiety and Depression Association of America (ADAA): adaa.org Mental Health America: mentalhealthamerica.net National Alliance on Mental Illness: nami.org Contact a health care provider if: You stop taking your antidepressant medicines, and you have any of these symptoms: Nausea. Headache. Light-headedness. Chills and body aches. Not being able to sleep (insomnia). You or your friends and family think your depression is getting worse. Get help right away if: You have thoughts of hurting yourself or others. Get help right away if you feel like you may hurt yourself or others, or have thoughts about taking your own life. Go to your nearest emergency room or: Call 911. Call the National Suicide Prevention Lifeline at  1-800-273-8255 or 988. This is open 24 hours a day. Text the Crisis Text Line at 741741. This information is not intended to replace advice given to you by your health care provider. Make sure you discuss any questions you have with your health care provider. Document Revised: 01/29/2022 Document Reviewed: 01/29/2022 Elsevier Patient Education  2023 Elsevier Inc.  

## 2022-10-28 LAB — HEPB+HEPC+HIV PANEL
HIV Screen 4th Generation wRfx: NONREACTIVE
Hep B C IgM: NEGATIVE
Hep B Core Total Ab: NEGATIVE
Hep B E Ab: NEGATIVE
Hep B E Ag: NEGATIVE
Hep B Surface Ab, Qual: REACTIVE
Hep C Virus Ab: NONREACTIVE
Hepatitis B Surface Ag: NEGATIVE

## 2022-10-28 LAB — RPR: RPR Ser Ql: NONREACTIVE

## 2022-10-29 LAB — NUSWAB VAGINITIS PLUS (VG+)
Candida albicans, NAA: NEGATIVE
Candida glabrata, NAA: NEGATIVE
Chlamydia trachomatis, NAA: NEGATIVE
Neisseria gonorrhoeae, NAA: NEGATIVE
Trich vag by NAA: NEGATIVE

## 2022-11-05 ENCOUNTER — Telehealth: Payer: Self-pay | Admitting: Family Medicine

## 2022-11-05 NOTE — Telephone Encounter (Signed)
Left message to call back  

## 2022-11-05 NOTE — Telephone Encounter (Signed)
Looks like she saw Bethany Hall for this. I can change to something else.  Where does she want rx sent?

## 2022-11-05 NOTE — Telephone Encounter (Signed)
Pt called to let PCP know that the Lexapro isnt working. Says its causing her to feel nauseous and pt isnt sleeping.

## 2022-11-14 NOTE — Telephone Encounter (Signed)
PATIENT REPORTS SHE HAS NEW INSURANCE AND WILL WAIT UNTIL APPOINTMENT WITH DR G TO DISCUSS CHANGING

## 2022-11-22 ENCOUNTER — Ambulatory Visit: Payer: BC Managed Care – PPO | Admitting: Family Medicine

## 2022-11-25 ENCOUNTER — Encounter: Payer: Self-pay | Admitting: Family Medicine

## 2022-11-27 ENCOUNTER — Ambulatory Visit: Payer: Self-pay | Admitting: Family Medicine

## 2023-01-06 ENCOUNTER — Telehealth: Payer: Self-pay | Admitting: Family Medicine

## 2023-01-06 DIAGNOSIS — F322 Major depressive disorder, single episode, severe without psychotic features: Secondary | ICD-10-CM

## 2023-01-06 DIAGNOSIS — F411 Generalized anxiety disorder: Secondary | ICD-10-CM

## 2023-01-06 MED ORDER — ESCITALOPRAM OXALATE 10 MG PO TABS: 10.0000 mg | ORAL_TABLET | Freq: Every day | ORAL | 0 refills | Status: DC

## 2023-01-06 MED ORDER — ESCITALOPRAM OXALATE 10 MG PO TABS: 10.0000 mg | ORAL_TABLET | Freq: Every day | ORAL | 3 refills | Status: DC

## 2023-01-06 NOTE — Telephone Encounter (Signed)
Tried calling pt. Phone went straight to vmail and the vmail box is full. Rx sent.

## 2023-01-06 NOTE — Telephone Encounter (Signed)
I am glad to refill as long as she is doing well on this.  Rx sent x 1 year

## 2023-01-06 NOTE — Telephone Encounter (Signed)
Dr. Lajuana Ripple,  [place] last seen the patient June of 2023. Tiffany seen her in Jan 2024 and started the lexapro. Pt was supposed to follow up in 4 week from then.  Per message pt has not has insurance and that is why she has not been back since January.  Are you alright to give a refill until appt 5/7?

## 2023-01-06 NOTE — Telephone Encounter (Signed)
  Prescription Request  01/06/2023  Is this a "Controlled Substance" medicine? no  Have you seen your PCP in the last 2 weeks? No pt has appt on 02/11/23 with Dr. Darnell Level. This is her first available, pt was not able to come in before due to not having insurance, pt is about to be out of this medication   If YES, route message to pool  -  If NO, patient needs to be scheduled for appointment.  What is the name of the medication or equipment? escitalopram (LEXAPRO) 10 MG tablet   Have you contacted your pharmacy to request a refill? yes  Which pharmacy would you like this sent to? [facility] [place], ok to leave VM if pt does not answer   Patient notified that their request is being sent to the clinical staff for review and that they should receive a response within 2 business days.

## 2023-01-06 NOTE — Addendum Note (Signed)
Addended by: Alphonzo Dublin on: 01/06/2023 04:26 PM   Modules accepted: Orders

## 2023-01-20 ENCOUNTER — Other Ambulatory Visit: Payer: Medicaid Other

## 2023-01-20 ENCOUNTER — Telehealth: Payer: Medicaid Other | Admitting: Family

## 2023-01-20 ENCOUNTER — Other Ambulatory Visit: Payer: Self-pay | Admitting: Family Medicine

## 2023-01-21 ENCOUNTER — Other Ambulatory Visit: Payer: Self-pay | Admitting: Family Medicine

## 2023-01-21 ENCOUNTER — Telehealth (INDEPENDENT_AMBULATORY_CARE_PROVIDER_SITE_OTHER): Payer: BC Managed Care – PPO | Admitting: Family Medicine

## 2023-01-21 ENCOUNTER — Encounter: Payer: Self-pay | Admitting: Family Medicine

## 2023-01-21 DIAGNOSIS — F322 Major depressive disorder, single episode, severe without psychotic features: Secondary | ICD-10-CM

## 2023-01-21 DIAGNOSIS — R3989 Other symptoms and signs involving the genitourinary system: Secondary | ICD-10-CM | POA: Diagnosis not present

## 2023-01-21 DIAGNOSIS — F411 Generalized anxiety disorder: Secondary | ICD-10-CM

## 2023-01-21 DIAGNOSIS — R300 Dysuria: Secondary | ICD-10-CM

## 2023-01-21 LAB — URINALYSIS
Bilirubin, UA: NEGATIVE
Glucose, UA: NEGATIVE
Leukocytes,UA: NEGATIVE
Nitrite, UA: NEGATIVE
Protein,UA: NEGATIVE
Specific Gravity, UA: >1.03 — ABNORMAL HIGH (ref 1.005–1.030)
Urobilinogen, Ur: 0.2 mg/dL (ref 0.2–1.0)
pH, UA: 6 (ref 5.0–7.5)

## 2023-01-21 MED ORDER — NITROFURANTOIN MONOHYD MACRO 100 MG PO CAPS
100.0000 mg | ORAL_CAPSULE | Freq: Two times a day (BID) | ORAL | 0 refills | Status: AC
Start: 2023-01-21 — End: 2023-01-26

## 2023-01-21 MED ORDER — FLUCONAZOLE 150 MG PO TABS
150.0000 mg | ORAL_TABLET | Freq: Once | ORAL | 0 refills | Status: AC
Start: 2023-01-21 — End: 2023-01-21

## 2023-01-21 MED ORDER — ESCITALOPRAM OXALATE 20 MG PO TABS
20.0000 mg | ORAL_TABLET | Freq: Every day | ORAL | 0 refills | Status: DC
Start: 2023-01-21 — End: 2023-02-11

## 2023-01-21 NOTE — Progress Notes (Signed)
MyChart Video visit  Subjective: CC:UTI PCP: Raliegh Ip, DO ZOX:WRUEAV Waitman is a 32 y.o. female. Patient provides verbal consent for consult held via video.  Due to COVID-19 pandemic this visit was conducted virtually. This visit type was conducted due to national recommendations for restrictions regarding the COVID-19 Pandemic (e.g. social distancing, sheltering in place) in an effort to limit this patient's exposure and mitigate transmission in our community. All issues noted in this document were discussed and addressed.  A physical exam was not performed with this format.   Location of patient: work Location of provider: [facility] Others present for call: none  1. UTI Patient reports that she went to UC 1 week ago for lower back pain and irritation.  She was treated for UTI.  She continues to have severe lower back pain.  She continues to have urinary frequency. No reports of fever, flank pain, hematuria.  Not sure what she was treated with or if they cultured her urine.  Thinks it started with an "s".  2. Picking/ anxiety Reports that picking really hasn't gotten better with the Lexapro  that was started in January.  She would like to try increasing the dose  ROS: Per HPI  Allergies  Allergen Reactions   Penicillins Rash    Has patient had a PCN reaction causing immediate rash, facial/tongue/throat swelling, SOB or lightheadedness with hypotension: Unknown Has patient had a PCN reaction causing severe rash involving mucus membranes or skin necrosis: Unknown Has patient had a PCN reaction that required hospitalization: Unknown Has patient had a PCN reaction occurring within the last 10 years: No If all of the above answers are "NO", then may proceed with Cephalosporin use. Infantile Reaction.   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   GERD  (gastroesophageal reflux disease)    occas tums prm   Medical history non-contributory    Neuromuscular disorder (HCC)    carpel tunnel bilateral    Current Outpatient Medications:    clotrimazole-betamethasone (LOTRISONE) cream, Apply 1 Application topically daily., Disp: 30 g, Rfl: 0   escitalopram (LEXAPRO) 10 MG tablet, Take 1 tablet (10 mg total) by mouth daily., Disp: 60 tablet, Rfl: 0   Norethindrone-Ethinyl Estradiol-Fe Biphas (LO LOESTRIN FE) 1 MG-10 MCG / 10 MCG tablet, Take 1 tablet by mouth daily., Disp: 84 tablet, Rfl: 4   omeprazole (PRILOSEC) 20 MG capsule, Take 1 capsule by mouth once daily, Disp: 90 capsule, Rfl: 1  Gen: nontoxic female. NAD Psych: mood stable, speech normal  Assessment/ Plan: 32 y.o. female   Suspected UTI - Plan: Urine Culture, Urinalysis, nitrofurantoin, macrocrystal-monohydrate, (MACROBID) 100 MG capsule, fluconazole (DIFLUCAN) 150 MG tablet  GAD (generalized anxiety disorder) - Plan: escitalopram (LEXAPRO) 20 MG tablet  Depression, major, single episode, severe - Plan: escitalopram (LEXAPRO) 20 MG tablet  UA with 1+ blood. Culture sent.  Macrobid/ Dlfucan sent.    Lexapro advanced to  daily.  May need to consider switching med.  Maybe to Zoloft or Pristiq. Has follow up scheduled 5/7.  Start time: 1:29pm End time: 1:36pm  Total time spent on patient care (including video visit/ documentation): 7 minutes  Silver Parkey Hulen Skains, DO [facility] [phone]

## 2023-01-22 LAB — URINE CULTURE

## 2023-02-11 ENCOUNTER — Encounter: Payer: Self-pay | Admitting: Family Medicine

## 2023-02-11 ENCOUNTER — Ambulatory Visit (INDEPENDENT_AMBULATORY_CARE_PROVIDER_SITE_OTHER): Payer: BC Managed Care – PPO | Admitting: Family Medicine

## 2023-02-11 VITALS — BP 106/67 | HR 87 | Temp 98.6°F | Ht 59.0 in | Wt 124.0 lb

## 2023-02-11 DIAGNOSIS — G8929 Other chronic pain: Secondary | ICD-10-CM

## 2023-02-11 DIAGNOSIS — L309 Dermatitis, unspecified: Secondary | ICD-10-CM

## 2023-02-11 DIAGNOSIS — M25511 Pain in right shoulder: Secondary | ICD-10-CM

## 2023-02-11 DIAGNOSIS — K219 Gastro-esophageal reflux disease without esophagitis: Secondary | ICD-10-CM

## 2023-02-11 DIAGNOSIS — Z3009 Encounter for other general counseling and advice on contraception: Secondary | ICD-10-CM

## 2023-02-11 DIAGNOSIS — M79601 Pain in right arm: Secondary | ICD-10-CM | POA: Diagnosis not present

## 2023-02-11 DIAGNOSIS — F411 Generalized anxiety disorder: Secondary | ICD-10-CM

## 2023-02-11 DIAGNOSIS — F322 Major depressive disorder, single episode, severe without psychotic features: Secondary | ICD-10-CM

## 2023-02-11 MED ORDER — LO LOESTRIN FE 1 MG-10 MCG / 10 MCG PO TABS
1.0000 | ORAL_TABLET | Freq: Every day | ORAL | 4 refills | Status: DC
Start: 2023-02-11 — End: 2023-09-29

## 2023-02-11 MED ORDER — HYDROXYZINE HCL 50 MG PO TABS
25.0000 mg | ORAL_TABLET | Freq: Three times a day (TID) | ORAL | 99 refills | Status: DC | PRN
Start: 2023-02-11 — End: 2023-04-08

## 2023-02-11 MED ORDER — OMEPRAZOLE 20 MG PO CPDR: 20.0000 mg | DELAYED_RELEASE_CAPSULE | Freq: Every day | ORAL | 1 refills | Status: DC

## 2023-02-11 MED ORDER — ESCITALOPRAM OXALATE 20 MG PO TABS: 20.0000 mg | ORAL_TABLET | Freq: Every day | ORAL | 3 refills | Status: DC

## 2023-02-11 NOTE — Progress Notes (Signed)
Subjective: CC: Follow-up depression, anxiety PCP: Raliegh Ip, DO ZOX:WRUEAV Slingsby is a 32 y.o. female presenting to clinic today for:  1.  Depression and anxiety Patient reports that overall she feels pretty good on the Lexapro 20 mg daily.  She continues to have some anxious behaviors where she will pick at her skin such that she will cause irritation, inflammation and scarring.  She would like referral to dermatologist to further evaluate this but also wonders if there is something that we can do to help reduce her need for this.  She has been getting artificial nails put on that she does not have the ability to pick at her skin.  This has helped some but not really gotten rid of the need to do so.  She also reports some difficulty with sleep sometimes and wanted to discuss that further.  She is planning on taking a trip to [facility] this summer in efforts to get some rest and relaxation in.  2.  Right shoulder stiffness Continues to struggle with right shoulder stiffness and pain.  She would like to proceed with referral for physical therapy on that right shoulder.  She is interested in dry needling and even the soft tissue work if able.  She has question may be getting chiropractic therapy but given her congenital growth issue she worries that they may not be safe to perform these types of techniques on her.   ROS: Per HPI  Allergies  Allergen Reactions   Penicillins Rash    Has patient had a PCN reaction causing immediate rash, facial/tongue/throat swelling, SOB or lightheadedness with hypotension: Unknown Has patient had a PCN reaction causing severe rash involving mucus membranes or skin necrosis: Unknown Has patient had a PCN reaction that required hospitalization: Unknown Has patient had a PCN reaction occurring within the last 10 years: No If all of the above answers are "NO", then may proceed with Cephalosporin use. Infantile Reaction.   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   GERD (gastroesophageal reflux disease)    occas tums prm   Medical history non-contributory    Neuromuscular disorder (HCC)    carpel tunnel bilateral    Current Outpatient Medications:    escitalopram (LEXAPRO) 20 MG tablet, Take 1 tablet (20 mg total) by mouth daily., Disp: 30 tablet, Rfl: 0   Norethindrone-Ethinyl Estradiol-Fe Biphas (LO LOESTRIN FE) 1 MG-10 MCG / 10 MCG tablet, Take 1 tablet by mouth daily., Disp: 84 tablet, Rfl: 4 Social History   Socioeconomic History   Marital status: Legally Separated    Spouse name: Not on file   Number of children: Not on file   Years of education: Not on file   Highest education level: Not on file  Occupational History   Not on file  Tobacco Use   Smoking status: Never   Smokeless tobacco: Never  Vaping Use   Vaping Use: Never used  Substance and Sexual Activity   Alcohol use: No    Comment: occ   Drug use: No   Sexual activity: Not Currently    Birth control/protection: None  Other Topics Concern   Not on file  Social History Narrative   Not on file   Social Determinants of Health   Financial Resource Strain: Not on file  Food Insecurity: Not on  file  Transportation Needs: Not on file  Physical Activity: Not on file  Stress: Not on file  Social Connections: Not on file  Intimate Partner Violence: Not on file   Family History  Problem Relation Age of Onset   Diabetes Mother    Heart disease Father    Adrenal disorder Neg Hx     Objective: Office vital signs reviewed. BP 106/67   Pulse 87   Temp 98.6 F (37 C)   Ht 4\' 11"  (1.499 m)   Wt 124 lb (56.2 kg)   SpO2 100%   BMI 25.04 kg/m   Physical Examination:  General: Awake, alert, well nourished, No acute distress HEENT: sclera white, MMM Skin: Several areas of hyperpigmentation and scarring noted along right upper  extremity and right upper back MSK: Limited active range of motion secondary to pain.  Painful arc sign present.     02/11/2023    9:43 AM 10/25/2022    9:20 AM 03/26/2022    4:32 PM  Depression screen PHQ 2/9  Decreased Interest 0 2 0  Down, Depressed, Hopeless 1 2 0  PHQ - 2 Score 1 4 0  Altered sleeping 3 2   Tired, decreased energy 2 2   Change in appetite 2 2   Feeling bad or failure about yourself  0 2   Trouble concentrating 0 2   Moving slowly or fidgety/restless 0 1   Suicidal thoughts 0 0   PHQ-9 Score 8 15   Difficult doing work/chores Somewhat difficult Very difficult       02/11/2023    9:38 AM 10/25/2022    9:21 AM 03/26/2022    4:32 PM 10/26/2021    9:31 AM  GAD 7 : Generalized Anxiety Score  Nervous, Anxious, on Edge 2 3 2 2   Control/stop worrying 0 3 1 1   Worry too much - different things 0 3 1 1   Trouble relaxing 2 3 2 2   Restless 0 3 2 2   Easily annoyed or irritable 2 3 1 2   Afraid - awful might happen 0 2 0 0  Total GAD 7 Score 6 20 9 10   Anxiety Difficulty Somewhat difficult Very difficult Not difficult at all Not difficult at all    Assessment/ Plan: 32 y.o. female   Dermatitis - Plan: Ambulatory referral to Dermatology, hydrOXYzine (ATARAX) 50 MG tablet  Chronic right shoulder pain - Plan: Ambulatory referral to Physical Therapy  Musculoskeletal pain of right upper extremity - Plan: Ambulatory referral to Physical Therapy  GAD (generalized anxiety disorder) - Plan: hydrOXYzine (ATARAX) 50 MG tablet, escitalopram (LEXAPRO) 20 MG tablet  Depression, major, single episode, severe (HCC) - Plan: hydrOXYzine (ATARAX) 50 MG tablet, escitalopram (LEXAPRO) 20 MG tablet  Encounter for other general counseling and advice on contraception - Plan: Norethindrone-Ethinyl Estradiol-Fe Biphas (LO LOESTRIN FE) 1 MG-10 MCG / 10 MCG tablet  Gastroesophageal reflux disease without esophagitis - Plan: omeprazole (PRILOSEC) 20 MG capsule  Continue Lexapro at current  dose.  I have added Atarax to use as needed sedation or anxiety attacks.  Discussed this medication can cause excessive daytime drowsiness so to use with caution.  She will follow-up if this is ineffective or intolerable and we can consider BuSpar instead.  Referral to physical therapy for consideration of dry needling and/or soft tissue work on that right shoulder.  OCP renewed.  PPI renewed  No orders of the defined types were placed in this encounter.  No orders of the defined types were  placed in this encounter.    Janora Norlander, DO [place] [phone]

## 2023-04-04 ENCOUNTER — Telehealth: Payer: Self-pay | Admitting: Family Medicine

## 2023-04-04 NOTE — Telephone Encounter (Signed)
NTBS.

## 2023-04-04 NOTE — Telephone Encounter (Signed)
Pt called to let Dr Nadine Counts know that is concerned because ever since she started taking the Lexapro and Hydroxyzine, she has been having sharp breast pain/tenderness. Says this has been going on for about 2 weeks off and on and wants to make sure she is ok. Unsure if this is a side affect from the medication or if she needs to make an appt to see Dr Nadine Counts about it.

## 2023-04-07 NOTE — Telephone Encounter (Signed)
Got her scheduled with [facility] for tomorrow.

## 2023-04-07 NOTE — Telephone Encounter (Signed)
Tried calling patient to get her scheduled but NA and VM full so I could not leave a message. Dr Nadine Counts does not have any openings until end of August unless nurse is able to work patient in to be seen sooner.

## 2023-04-08 ENCOUNTER — Ambulatory Visit: Payer: BC Managed Care – PPO | Admitting: Family Medicine

## 2023-04-08 ENCOUNTER — Encounter: Payer: Self-pay | Admitting: Family Medicine

## 2023-04-08 VITALS — BP 106/71 | HR 76 | Temp 98.2°F | Ht 59.0 in | Wt 135.0 lb

## 2023-04-08 DIAGNOSIS — F411 Generalized anxiety disorder: Secondary | ICD-10-CM

## 2023-04-08 DIAGNOSIS — N644 Mastodynia: Secondary | ICD-10-CM | POA: Diagnosis not present

## 2023-04-08 LAB — PREGNANCY, URINE: Preg Test, Ur: NEGATIVE

## 2023-04-08 MED ORDER — QUETIAPINE FUMARATE 25 MG PO TABS
25.0000 mg | ORAL_TABLET | Freq: Every day | ORAL | 1 refills | Status: DC
Start: 2023-04-08 — End: 2023-07-18

## 2023-04-08 MED ORDER — DICLOFENAC SODIUM 75 MG PO TBEC
75.0000 mg | DELAYED_RELEASE_TABLET | Freq: Two times a day (BID) | ORAL | 0 refills | Status: DC
Start: 2023-04-08 — End: 2023-08-15

## 2023-04-08 NOTE — Progress Notes (Signed)
Subjective:  Patient ID: Bethany Hall, female    DOB: [birth year]  Age: 32 y.o. MRN: [id number]  CC: Breast Pain   HPI Bethany Hall presents for three weeks of tenderness and occasional sharp pain in both breasts. She has had no DC. Started around the time of last menses, but has persisted. Also started shortly after she started taking hydroxyzine. Takes OC, but no tenderness in the past with that medication.      04/08/2023    2:19 PM 02/11/2023    9:43 AM 10/25/2022    9:20 AM  Depression screen PHQ 2/9  Decreased Interest 1 0 2  Down, Depressed, Hopeless 0 1 2  PHQ - 2 Score 1 1 4   Altered sleeping 2 3 2   Tired, decreased energy 2 2 2   Change in appetite 2 2 2   Feeling bad or failure about yourself  0 0 2  Trouble concentrating 1 0 2  Moving slowly or fidgety/restless 0 0 1  Suicidal thoughts 0 0 0  PHQ-9 Score 8 8 15   Difficult doing work/chores Somewhat difficult Somewhat difficult Very difficult    History Bethany Hall has a past medical history of Chronic pain, Deformity, GERD (gastroesophageal reflux disease), Medical history non-contributory, and Neuromuscular disorder (HCC).   She has a past surgical history that includes Cesarean section (2011,2014,2016,02/2017) and Dilation and evacuation (N/A, 08/07/2017).   Her family history includes Diabetes in her mother; Heart disease in her father.She reports that she has never smoked. She has never used smokeless tobacco. She reports that she does not drink alcohol and does not use drugs.    ROS Review of Systems  Constitutional: Negative.   HENT: Negative.    Eyes:  Negative for visual disturbance.  Respiratory:  Negative for shortness of breath.   Cardiovascular:  Negative for chest pain.  Gastrointestinal:  Negative for abdominal pain.  Musculoskeletal:  Negative for arthralgias.    Objective:  BP 106/71   Pulse 76   Temp 98.2 F (36.8 C)   Ht 4\' 11"  (1.499 m)   Wt 135 lb (61.2 kg)   SpO2 99%   BMI 27.27 kg/m    BP Readings from Last 3 Encounters:  04/08/23 106/71  02/11/23 106/67  10/25/22 (!) 95/59    Wt Readings from Last 3 Encounters:  04/08/23 135 lb (61.2 kg)  02/11/23 124 lb (56.2 kg)  03/26/22 122 lb 12.8 oz (55.7 kg)     Physical Exam Constitutional:      General: She is not in acute distress.    Appearance: She is well-developed.  Cardiovascular:     Rate and Rhythm: Normal rate and regular rhythm.  Pulmonary:     Breath sounds: Normal breath sounds.  Chest:     Chest wall: No mass or deformity.  Breasts:    Tanner Score is 5.     Right: Normal. No inverted nipple, mass, nipple discharge, skin change or tenderness.     Left: Normal. No inverted nipple, mass, nipple discharge, skin change or tenderness.  Musculoskeletal:        General: Normal range of motion.  Skin:    General: Skin is warm and dry.  Neurological:     Mental Status: She is alert and oriented to person, place, and time.       Assessment & Plan:   Bethany Hall was seen today for breast pain.  Diagnoses and all orders for this visit:  Breast tenderness in female -     Pregnancy,  urine  Mastalgia  GAD (generalized anxiety disorder)  Other orders -     QUEtiapine (SEROQUEL) 25 MG tablet; Take 1 tablet (25 mg total) by mouth at bedtime. -     diclofenac (VOLTAREN) 75 MG EC tablet; Take 1 tablet (75 mg total) by mouth 2 (two) times daily. For muscle and  Joint pain       I have discontinued Bethany Hall's hydrOXYzine. I am also having her start on QUEtiapine and diclofenac. Additionally, I am having her maintain her escitalopram, Lo Loestrin Fe, and omeprazole.  Allergies as of 04/08/2023       Reactions   Penicillins Rash   Has patient had a PCN reaction causing immediate rash, facial/tongue/throat swelling, SOB or lightheadedness with hypotension: Unknown Has patient had a PCN reaction causing severe rash involving mucus membranes or skin necrosis: Unknown Has patient had a PCN reaction  that required hospitalization: Unknown Has patient had a PCN reaction occurring within the last 10 years: No If all of the above answers are "NO", then may proceed with Cephalosporin use. Infantile Reaction.        Medication List        Accurate as of April 08, 2023 10:50 PM. If you have any questions, ask your nurse or doctor.          STOP taking these medications    hydrOXYzine 50 MG tablet Commonly known as: ATARAX Stopped by: Bethany Claude, MD       TAKE these medications    diclofenac 75 MG EC tablet Commonly known as: VOLTAREN Take 1 tablet (75 mg total) by mouth 2 (two) times daily. For muscle and  Joint pain Started by: Bethany Claude, MD   escitalopram 20 MG tablet Commonly known as: Lexapro Take 1 tablet (20 mg total) by mouth daily.   Lo Loestrin Fe 1 MG-10 MCG / 10 MCG tablet Generic drug: Norethindrone-Ethinyl Estradiol-Fe Biphas Take 1 tablet by mouth daily.   omeprazole 20 MG capsule Commonly known as: PRILOSEC Take 1 capsule (20 mg total) by mouth daily. AS needed   QUEtiapine 25 MG tablet Commonly known as: SEROQUEL Take 1 tablet (25 mg total) by mouth at bedtime. Started by: Bethany Claude, MD         Follow-up: Return in about 1 month (around 05/09/2023), or with PCP.  Bethany Hall, M.D.

## 2023-05-28 ENCOUNTER — Encounter: Payer: Self-pay | Admitting: Family Medicine

## 2023-05-28 ENCOUNTER — Ambulatory Visit: Payer: BC Managed Care – PPO | Admitting: Family Medicine

## 2023-05-28 VITALS — BP 90/59 | HR 74 | Temp 98.5°F | Ht 59.0 in | Wt 144.0 lb

## 2023-05-28 DIAGNOSIS — G5602 Carpal tunnel syndrome, left upper limb: Secondary | ICD-10-CM

## 2023-05-28 DIAGNOSIS — F411 Generalized anxiety disorder: Secondary | ICD-10-CM

## 2023-05-28 DIAGNOSIS — N644 Mastodynia: Secondary | ICD-10-CM | POA: Diagnosis not present

## 2023-05-28 DIAGNOSIS — F322 Major depressive disorder, single episode, severe without psychotic features: Secondary | ICD-10-CM | POA: Diagnosis not present

## 2023-05-28 DIAGNOSIS — F5101 Primary insomnia: Secondary | ICD-10-CM | POA: Diagnosis not present

## 2023-05-28 MED ORDER — RAMELTEON 8 MG PO TABS
8.0000 mg | ORAL_TABLET | Freq: Every day | ORAL | 3 refills | Status: DC
Start: 2023-05-28 — End: 2023-07-30

## 2023-05-28 NOTE — Patient Instructions (Addendum)
Referral is in. I don't see any indication they have reached out so I recommend you give them a call.  It may be that they have a waiting list [facility] [place] [address] [place] [address] [phone]  Set up Physical and pap smear.  Last pap was 03/2020.   There are HPV vaccines available to you.  Carpal Tunnel Syndrome  Carpal tunnel syndrome is a condition that causes pain, weakness, and numbness in your hand and arm. Numbness is when you cannot feel an area in your body. The carpal tunnel is a narrow area that is on the palm side of your wrist. Repeated wrist motion or certain diseases may cause swelling in the tunnel. This swelling can pinch the main nerve in the wrist. This nerve is called the median nerve. What are the causes? This condition may be caused by: Moving your hand and wrist over and over again while doing a task. Injury to the wrist. Arthritis. A sac of fluid (cyst) or abnormal growth (tumor) in the carpal tunnel. Fluid buildup during pregnancy. Use of tools that vibrate. Sometimes the cause is not known. What increases the risk? The following factors may make you more likely to have this condition: Having a job that makes you do these things: Move your hand over and over again. Work with tools that vibrate, such as drills or sanders. Being a woman. Having diabetes, obesity, thyroid problems, or kidney failure. What are the signs or symptoms? Symptoms of this condition include: A tingling feeling in your fingers. Tingling or loss of feeling in your hand. Pain in your entire arm. This pain may get worse when you bend your wrist and elbow for a long time. Pain in your wrist that goes up your arm to your shoulder. Pain that goes down into your palm or fingers. Weakness in your hands. You may find it hard to grab and hold items. You may feel worse at night. How is this treated? This condition may be treated with: Lifestyle changes. You will be  asked to stop or change the activity that caused your problem. Doing exercises and activities that make bones, muscles, and tendons stronger (physical therapy). Learning how to use your hand again (occupational therapy). Medicines for pain and swelling. You may have injections in your wrist. A wrist splint or brace. Surgery. Follow these instructions at home: If you have a splint or brace: Wear the splint or brace as told by your doctor. Take it off only as told by your doctor. Loosen the splint if your fingers: Tingle. Become numb. Turn cold and blue. Keep the splint or brace clean. If the splint or brace is not waterproof: Do not let it get wet. Cover it with a watertight covering when you take a bath or a shower. Managing pain, stiffness, and swelling If told, put ice on the painful area: If you have a removable splint or brace, remove it as told by your doctor. Put ice in a plastic bag. Place a towel between your skin and the bag. Leave the ice on for 20 minutes, 2-3 times per day. Do not fall asleep with the cold pack on your skin. Take off the ice if your skin turns bright red. This is very important. If you cannot feel pain, heat, or cold, you have a greater risk of damage to the area. Move your fingers often to reduce stiffness and swelling. General instructions Take over-the-counter and prescription medicines only as told by your doctor. Rest  your wrist from any activity that may cause pain. If needed, talk with your boss at work about changes that can help your wrist heal. Do exercises as told by your doctor, physical therapist, or occupational therapist. Keep all follow-up visits. Contact a doctor if: You have new symptoms. Medicine does not help your pain. Your symptoms get worse. Get help right away if: You have very bad numbness or tingling in your wrist or hand. Summary Carpal tunnel syndrome is a condition that causes pain in your hand and arm. It is often caused  by repeated wrist motions. Lifestyle changes and medicines are used to treat this problem. Surgery may help in very bad cases. Follow your doctor's instructions about wearing a splint, resting your wrist, keeping follow-up visits, and calling for help. This information is not intended to replace advice given to you by your health care provider. Make sure you discuss any questions you have with your health care provider. Document Revised: 02/03/2020 Document Reviewed: 02/03/2020 Elsevier Patient Education  2024 [employer].

## 2023-05-28 NOTE — Progress Notes (Signed)
Subjective: CC: Follow-up insomnia, breast pain PCP: Raliegh Ip, DO ZOX:WRUEAV Bethany Hall is a 32 y.o. female presenting to clinic today for:  1.  Insomnia associated with anxiety and depression Patient reports that her mood seems to be stable on Lexapro.  She has ups and downs occasionally.  She was seen by my partner in July and he started Seroquel but she admits that she never did start this medication because she was reluctant to start without discussing with me first.  She admits to weight gain and does admit to increased eating.  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  She does wonder if the hydroxyzine is contributing to breast pain because when she started taking less of it the breast pain seem to get somewhat better.  She admits that she does not hydrate as well as she should sometimes.  She just gets busy.  She admits that she has seen some improvement with the hydroxyzine and her picking at the skin.  2.  Left hand pain Patient reports pain along the digits of 2 through 4.  She reports of burning, tugging pain.  She does rely on her left hand quite a bit due to her muscular disorder.  Symptoms seem to be more prevalent at nighttime.  No treatment for this so far.   ROS: Per HPI  Allergies  Allergen Reactions   Penicillins Rash    Has patient had a PCN reaction causing immediate rash, facial/tongue/throat swelling, SOB or lightheadedness with hypotension: Unknown Has patient had a PCN reaction causing severe rash involving mucus membranes or skin necrosis: Unknown Has patient had a PCN reaction that required hospitalization: Unknown Has patient had a PCN reaction occurring within the last 10 years: No If all of the above answers are "NO", then may proceed with Cephalosporin use. Infantile Reaction.   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 AS needed, Disp: 90 capsule, Rfl: 1   diclofenac (VOLTAREN) 75 MG EC tablet, Take 1 tablet (75 mg total) by mouth 2 (two) times daily. For muscle and  Joint pain (Patient not taking: Reported on 05/28/2023), Disp: 30 tablet, Rfl: 0   QUEtiapine (SEROQUEL) 25 MG tablet, Take 1 tablet (25 mg total) by mouth at bedtime. (Patient not taking: Reported on 05/28/2023), Disp: 90 tablet, Rfl: 1 Social History   Socioeconomic History   Marital status: Legally Separated    Spouse name: Not on file   Number of children: Not on file   Years of education: Not on file   Highest education level: Not on file  Occupational  History   Not on file  Tobacco Use   Smoking status: Never   Smokeless tobacco: Never  Vaping Use   Vaping status: Never Used  Substance and Sexual Activity   Alcohol use: No    Comment: occ   Drug use: No   Sexual activity: Not Currently    Birth control/protection: None  Other Topics Concern   Not on file  Social History Narrative   Not on file   Social Determinants of Health   Financial Resource Strain: Not on file  Food Insecurity: Not on file  Transportation Needs: Not on file  Physical Activity: Not on file  Stress: Not on file  Social Connections: Not on file  Intimate Partner Violence: Not on file   Family  History  Problem Relation Age of Onset   Diabetes Mother    Heart disease Father    Adrenal disorder Neg Hx     Objective: Office vital signs reviewed. BP (!) 90/59   Pulse 74   Temp 98.5 F (36.9 C)   Ht 4\' 11"  (1.499 m)   Wt 144 lb (65.3 kg)   LMP 05/14/2023   SpO2 99%   BMI 29.08 kg/m   Physical Examination:  General: Awake, alert, well nourished, No acute distress Skin: No active picked lesions or rashes appreciated MSK: Congenital neuromuscular changes.  Her left wrist shows no deformity including atrophy, soft tissue swelling, erythema or warmth.  Her range of motion is preserved.  No deformities of the fingers.  Positive reverse Phalen's.    04/08/2023    2:19 PM 02/11/2023    9:43 AM 10/25/2022    9:20 AM  Depression screen PHQ 2/9  Decreased Interest 1 0 2  Down, Depressed, Hopeless 0 1 2  PHQ - 2 Score 1 1 4   Altered sleeping 2 3 2   Tired, decreased energy 2 2 2   Change in appetite 2 2 2   Feeling bad or failure about yourself  0 0 2  Trouble concentrating 1 0 2  Moving slowly or fidgety/restless 0 0 1  Suicidal thoughts 0 0 0  PHQ-9 Score 8 8 15   Difficult doing work/chores Somewhat difficult Somewhat difficult Very difficult      04/08/2023    2:19 PM 02/11/2023    9:38 AM 10/25/2022    9:21 AM 03/26/2022    4:32 PM  GAD 7 : Generalized Anxiety Score  Nervous, Anxious, on Edge 2 2 3 2   Control/stop worrying 2 0 3 1  Worry too much - different things 2 0 3 1  Trouble relaxing 2 2 3 2   Restless 2 0 3 2  Easily annoyed or irritable 2 2 3 1   Afraid - awful might happen 0 0 2 0  Total GAD 7 Score 12 6 20 9   Anxiety Difficulty Not difficult at all Somewhat difficult Very difficult Not difficult at all     Assessment/ Plan: 32 y.o. female   GAD (generalized anxiety disorder)  Depression, major, single episode, severe (HCC)  Breast tenderness in female - Plan: MM 3D DIAGNOSTIC MAMMOGRAM BILATERAL BREAST, US BREAST COMPLETE UNI LEFT INC AXILLA, US BREAST  COMPLETE UNI RIGHT INC AXILLA  Primary insomnia - Plan: ramelteon (ROZEREM) 8 MG tablet  Carpal tunnel syndrome of left wrist  Anxiety disorder is chronic and stable.  She never did start the Seroquel.  Given weight gain I agree that atypical and psychotic is likely not a great option for her at this point.  She  will continue current regimen but hold off on utilization of Atarax for sleep and instead I have replaced this with ramelteon.  For her ongoing breast tenderness, again may be related to drying effects of hydroxyzine but I cannot find in the literature any direct association with mastalgia and Atarax use.  For this reason we will proceed with mammogram and ultrasound to further evaluate.  Again I still suspect fibrocystic disease and have encouraged oral hydration, avoidance of caffeine etc.  We discussed that the sensation she is having in her left wrist is likely carpal tunnel syndrome.  We discussed utilization of topical NSAIDs, ice and bracing at nighttime.  If persistent, low threshold to have her see a specialist and perhaps they can offer injection into that area.   Raliegh Ip, DO [facility] [phone]

## 2023-06-02 ENCOUNTER — Other Ambulatory Visit: Payer: Self-pay

## 2023-06-02 DIAGNOSIS — N644 Mastodynia: Secondary | ICD-10-CM

## 2023-06-23 ENCOUNTER — Telehealth: Payer: Self-pay | Admitting: Family Medicine

## 2023-06-27 NOTE — Telephone Encounter (Signed)
I have LM for [facility] to F/U on Referral at this time.

## 2023-06-30 NOTE — Telephone Encounter (Signed)
Per [facility] - They now have a different fax number - Faxed Referral to new fax number. Also spoke with Patient and made her aware of this information. Patient voiced understanding.

## 2023-06-30 NOTE — Telephone Encounter (Signed)
Pt called [facility] today and was told that they do not have pt referral. Pt wants a call back since we get an update. Pt says that office told her to make sure that the referral is being faxed to the right number. Pt did not have the fax number.

## 2023-07-08 ENCOUNTER — Ambulatory Visit (HOSPITAL_COMMUNITY)
Admission: RE | Admit: 2023-07-08 | Discharge: 2023-07-08 | Disposition: A | Discharge status: Home / Self Care | Payer: BC Managed Care – PPO | Source: Ambulatory Visit | Attending: Family Medicine | Admitting: Family Medicine

## 2023-07-08 ENCOUNTER — Encounter (HOSPITAL_COMMUNITY): Payer: Self-pay

## 2023-07-08 ENCOUNTER — Ambulatory Visit (HOSPITAL_COMMUNITY)
Admission: RE | Admit: 2023-07-08 | Discharge: 2023-07-08 | Discharge status: Home / Self Care | Payer: BC Managed Care – PPO | Source: Ambulatory Visit | Attending: Family Medicine

## 2023-07-08 DIAGNOSIS — N644 Mastodynia: Secondary | ICD-10-CM

## 2023-07-18 ENCOUNTER — Encounter: Payer: BC Managed Care – PPO | Admitting: Family Medicine

## 2023-07-30 ENCOUNTER — Encounter: Payer: Self-pay | Admitting: Family Medicine

## 2023-07-30 ENCOUNTER — Telehealth (INDEPENDENT_AMBULATORY_CARE_PROVIDER_SITE_OTHER): Payer: BC Managed Care – PPO | Admitting: Family Medicine

## 2023-07-30 DIAGNOSIS — G5602 Carpal tunnel syndrome, left upper limb: Secondary | ICD-10-CM

## 2023-07-30 DIAGNOSIS — M25511 Pain in right shoulder: Secondary | ICD-10-CM

## 2023-07-30 DIAGNOSIS — G8929 Other chronic pain: Secondary | ICD-10-CM

## 2023-07-30 DIAGNOSIS — M542 Cervicalgia: Secondary | ICD-10-CM

## 2023-07-30 NOTE — Progress Notes (Signed)
Could not connect to video visit.

## 2023-08-15 ENCOUNTER — Ambulatory Visit: Payer: Self-pay | Admitting: Family Medicine

## 2023-08-15 ENCOUNTER — Encounter: Payer: Self-pay | Admitting: Nurse Practitioner

## 2023-08-15 ENCOUNTER — Ambulatory Visit: Payer: BC Managed Care – PPO | Admitting: Nurse Practitioner

## 2023-08-15 VITALS — BP 106/72 | HR 76 | Temp 97.1°F | Resp 20 | Ht 59.0 in | Wt 149.0 lb

## 2023-08-15 DIAGNOSIS — M778 Other enthesopathies, not elsewhere classified: Secondary | ICD-10-CM | POA: Diagnosis not present

## 2023-08-15 MED ORDER — PREDNISONE 10 MG (21) PO TBPK
ORAL_TABLET | ORAL | 0 refills | Status: DC
Start: 2023-08-15 — End: 2023-09-29

## 2023-08-15 NOTE — Telephone Encounter (Signed)
Reason for Disposition . [1] Finger wound AND [2] entire finger swollen  Protocols used: Wound Infection Suspected-A-AH

## 2023-08-15 NOTE — Telephone Encounter (Signed)
Copied from CRM [id number]. Topic: Clinical - Red Word Triage >> Aug 15, 2023 10:02 AM Dennison Nancy wrote: Red Word that prompted transfer to Nurse Triage: right hand extremely swollen and can not move her thumb . Call back # [phone]    Chief Complaint: Edema, Erythema Right Hand, Thumb and Arm Symptoms: Erythema, Rebound Tenderness, Soreness, Edema Frequency: Acute Pertinent Negatives: Patient denies dyspnea, chest pain, pregnancy, fever, or any other associated symptoms.  Disposition: [] ED /[] Urgent Care (no appt availability in office) / [x] Appointment(In office/virtual)/ []  [facility]/ [] Home Care/ [] Refused Recommended Disposition /[] [facility]/ []  Follow-up with PCP Additional Notes: Patient is unsure of cause, Appointment scheduled today.    Reason for Disposition  SEVERE hand swelling (e.g., swelling of entire hand and up into forearm)  Answer Assessment - Initial Assessment Questions 1. ONSET: "When did the swelling start?" (e.g., minutes, hours, days)     2 Weeks 2. LOCATION: "What part of the hand is swollen?"  "Are both hands swollen or just one hand?"     Right Hand 3. SEVERITY: "How bad is the swelling?" (e.g., localized; mild, moderate, severe)   - BALL OR LUMP: small ball or lump   - LOCALIZED: puffy or swollen area or patch of skin   - JOINT SWELLING: swelling of a joint   - MILD: puffiness or mild swelling of fingers or hand   - MODERATE: fingers and hand are swollen   - SEVERE: swelling of entire hand and up into forearm     Severe 4. REDNESS: "Does the swelling look red or infected?"     Reddened 5. PAIN: "Is the swelling painful to touch?" If Yes, ask: "How painful is it?"   (Scale 1-10; mild, moderate or severe)     10 6. FEVER: "Do you have a fever?" If Yes, ask: "What is it, how was it measured, and when did it start?"      No 7. CAUSE: "What do you think is causing the hand swelling?" (e.g., heat, insect bite, pregnancy, recent  injury)     Unknown 8. MEDICAL HISTORY: "Do you have a history of heart failure, kidney disease, liver failure, or cancer?"     Non-contributory.  9. RECURRENT SYMPTOM: "Have you had hand swelling before?" If Yes, ask: "When was the last time?" "What happened that time?"     First time.  10. OTHER SYMPTOMS: "Do you have any other symptoms?" (e.g., blurred vision, difficulty breathing, headache)       Headache, neck pain.  11. PREGNANCY: "Is there any chance you are pregnant?" "When was your last menstrual period?"       No.  Protocols used: Hand Swelling-A-AH

## 2023-08-15 NOTE — Progress Notes (Signed)
   Subjective:    Patient ID: Bethany Hall, female    DOB: [birth year], 32 y.o.   MRN: [id number]   Chief Complaint: Right arm and hand swollen and painful   HPI   Patient in c/o right thumb  and wrist pain. Started about 2 weeks ago and is gradually getting worse. Rates pai 10/10 today. Denies injury. Patient Active Problem List   Diagnosis Date Noted   Carpal tunnel syndrome of left wrist 05/28/2023   GAD (generalized anxiety disorder) 04/08/2023   Mastalgia 04/08/2023   Hyperpigmentation 01/16/2021   Degenerative disc disease, cervical 10/20/2018   Chronic bilateral thoracic back pain 10/20/2018   Musculoskeletal pain of right upper extremity 10/20/2018   Chronically dry eyes, bilateral 10/20/2018   Dry mouth 10/20/2018       Review of Systems  Constitutional:  Negative for diaphoresis.  Eyes:  Negative for pain.  Respiratory:  Negative for shortness of breath.   Cardiovascular:  Negative for chest pain, palpitations and leg swelling.  Gastrointestinal:  Negative for abdominal pain.  Endocrine: Negative for polydipsia.  Skin:  Negative for rash.  Neurological:  Negative for dizziness, weakness and headaches.  Hematological:  Does not bruise/bleed easily.  All other systems reviewed and are negative.      Objective:   Physical Exam Constitutional:      Appearance: Normal appearance.  Musculoskeletal:     Comments: Pain with finger opposition to thumb on right. Pain on internal rotation of thumb FROM  of right wrist with pain on flexion and extension.  Neurological:     Mental Status: She is alert.    BP 106/72   Pulse 76   Temp (!) 97.1 F (36.2 C) (Temporal)   Resp 20   Ht 4\' 11"  (1.499 m)   Wt 149 lb (67.6 kg)   SpO2 97%   BMI 30.09 kg/m         Assessment & Plan:   Bethany Hall in today with chief complaint of Right arm and hand swollen and painful   1. Thumb tendonitis - predniSONE (STERAPRED UNI-PAK 21 TAB) 10 MG (21) TBPK tablet; As  directed x 6 days  Dispense: 21 tablet; Refill: 0  2. Right wrist tendonitis - predniSONE (STERAPRED UNI-PAK 21 TAB) 10 MG (21) TBPK tablet; As directed x 6 days  Dispense: 21 tablet; Refill: 0  Rest  Ice RTO prn  The above assessment and management plan was discussed with the patient. The patient verbalized understanding of and has agreed to the management plan. Patient is aware to call the clinic if symptoms persist or worsen. Patient is aware when to return to the clinic for a follow-up visit. Patient educated on when it is appropriate to go to the emergency department.   Mary-Margaret Daphine Deutscher, FNP

## 2023-08-15 NOTE — Patient Instructions (Signed)
Tendinitis  Tendinitis is inflammation of a tendon. A tendon is a strong cord of tissue that connects muscle to bone. Tendinitis can affect any tendon, but it most commonly affects the: Shoulder tendon (biceps tendon or rotator cuff). Ankle tendon (Achilles tendon). Elbow tendons. Tendons in the wrist. What are the causes? This condition may be caused by: Overusing a tendon or muscle. This is the most common cause. Age-related wear and tear. Injury. Inflammatory conditions, such as arthritis. Certain medicines. What increases the risk? You are more likely to develop this condition if you do activities that involve the same movements over and over again (repetitive motions). What are the signs or symptoms? Symptoms of this condition may include: Pain. Tenderness. Mild swelling. Decreased range of motion. How is this diagnosed? This condition is diagnosed with a physical exam. You may also have tests, such as: Ultrasound. This uses sound waves to make an image of the inside of your body in the affected area. MRI. This uses magnetic fields and radio waves to make an image of the inside of your body in the affected area. How is this treated? This condition may be treated by resting, icing, applying pressure (compression), and raising (elevating) the affected area above the level of your heart. This is known as RICE therapy. Treatment may also include: Medicines to help reduce inflammation or to help reduce pain. Exercises or physical therapy to improve movement and strength of the tendon. A brace or splint. Injection of corticosteroid medicine. This may be done in some cases. Surgery. This is rarely needed and only used if all other treatment has failed. Follow these instructions at home: If you have a removable splint or brace: Wear the splint or brace as told by your health care provider. Remove it only as told by your health care provider. Check the skin around the splint or brace  every day. Tell your health care provider about any concerns. Loosen the splint or brace if your fingers or toes tingle, become numb, or turn cold and blue. Keep the splint or brace clean. If the splint or brace is not waterproof: Do not let it get wet. Cover it with a watertight covering when you take a bath or shower, or remove it as told by your health care provider. Managing pain, stiffness, and swelling     If directed, put ice on the affected area. To do this: If you have a removable splint or brace, remove it as told by your health care provider. Put ice in a plastic bag. Place a towel between your skin and the bag. Leave the ice on for 20 minutes, 2-3 times a day. Remove the ice if your skin turns bright red. This is very important. If you cannot feel pain, heat, or cold, you have a greater risk of damage to the area. Move the fingers or toes of the affected limb often, if this applies. This can help to reduce stiffness and swelling. If directed, elevate the affected area above the level of your heart while you are sitting or lying down. If directed, apply heat to the affected area before you exercise. Use the heat source that your health care provider recommends, such as a moist heat pack or a heating pad. Place a towel between your skin and the heat source. Leave the heat on for 20-30 minutes. Remove the heat if your skin turns bright red. This is especially important if you are unable to feel pain, heat, or cold. You have a  greater risk of getting burned. Activity Rest the affected area as told by your health care provider. Ask your health care provider when it is safe to drive if you have a splint or brace on any part of your arm or leg. Return to your normal activities as told by your health care provider. Ask your health care provider what activities are safe for you. Avoid using the affected area while you are experiencing symptoms of tendinitis. Do exercises as told by your  health care provider. General instructions Wear an elastic bandage or compression wrap only as told by your health care provider. Take over-the-counter and prescription medicines only as told by your health care provider. Keep all follow-up visits. This is important. Contact a health care provider if: Your symptoms do not improve. You develop new, unexplained problems, such as numbness in your hands or feet. Summary Tendinitis is inflammation of a tendon. You are more likely to develop this condition if you do activities that involve the same movements over and over again. This condition may be treated by resting, icing, applying pressure (compression), and elevating the area above the level of your heart. This is known as RICE therapy. Avoid using the affected area while you are experiencing symptoms of tendinitis. This information is not intended to replace advice given to you by your health care provider. Make sure you discuss any questions you have with your health care provider. Document Revised: 05/31/2021 Document Reviewed: 05/31/2021 Elsevier Patient Education  2024 [employer].

## 2023-08-26 ENCOUNTER — Encounter: Payer: Self-pay | Admitting: Family Medicine

## 2023-08-28 ENCOUNTER — Ambulatory Visit: Payer: BC Managed Care – PPO

## 2023-08-29 ENCOUNTER — Encounter: Payer: Self-pay | Admitting: Family Medicine

## 2023-08-29 ENCOUNTER — Ambulatory Visit: Payer: BC Managed Care – PPO | Admitting: Family Medicine

## 2023-08-29 VITALS — BP 130/74 | HR 74 | Temp 97.6°F | Ht 59.0 in | Wt 153.8 lb

## 2023-08-29 DIAGNOSIS — G5603 Carpal tunnel syndrome, bilateral upper limbs: Secondary | ICD-10-CM

## 2023-08-29 MED ORDER — MELOXICAM 15 MG PO TABS
15.0000 mg | ORAL_TABLET | Freq: Every day | ORAL | 1 refills | Status: DC
Start: 2023-08-29 — End: 2023-09-29

## 2023-08-29 NOTE — Progress Notes (Signed)
   Acute Office Visit  Subjective:     Patient ID: Bethany Hall, female    DOB: [birth year], 32 y.o.   MRN: [id number]  Chief Complaint  Patient presents with   body pain    Hand Pain    Patient is in today for pain in both of her hands for the last few months, worse over the last few weeks.  R>L.  She has numbness and pain in both thumbs, pointer, middle, and ring fingers. Intermittent swelling in hands. Pain in wrist. Pain is worse at night, at work when typing. She has failed wrist bracing, prednisone taper, advil, and tylenol. Interested in referral to ortho.   ROS As per HPI.      Objective:    BP 130/74   Pulse 74   Temp 97.6 F (36.4 C) (Temporal)   Ht 4\' 11"  (1.499 m)   Wt 153 lb 12.8 oz (69.8 kg)   SpO2 97%   BMI 31.06 kg/m  Wt Readings from Last 3 Encounters:  08/29/23 153 lb 12.8 oz (69.8 kg)  08/15/23 149 lb (67.6 kg)  05/28/23 144 lb (65.3 kg)     Physical Exam Vitals and nursing note reviewed.  Constitutional:      General: She is not in acute distress.    Appearance: She is not ill-appearing, toxic-appearing or diaphoretic.  Musculoskeletal:     Right hand: Swelling (generalized) present. No tenderness or bony tenderness. Normal capillary refill.     Left hand: Swelling (generalized) present. No tenderness or bony tenderness. Normal capillary refill.  Skin:    General: Skin is warm and dry.  Neurological:     General: No focal deficit present.     Mental Status: She is alert and oriented to person, place, and time.  Psychiatric:        Mood and Affect: Mood normal.        Behavior: Behavior normal.     No results found for any visits on 08/29/23.      Assessment & Plan:   [name] was seen today for hand pain.  Diagnoses and all orders for this visit:  Bilateral carpal tunnel syndrome Failed conservative measures. Referral to ortho discussed and placed. Meloxicam daily. Do not take other NSAIDs with this. Continue bracing.  -      Ambulatory referral to Orthopedic Surgery -     meloxicam (MOBIC) 15 MG tablet; Take 1 tablet (15 mg total) by mouth daily.   Return if symptoms worsen or fail to improve.  The patient indicates understanding of these issues and agrees with the plan.  Gabriel Earing, FNP

## 2023-09-03 ENCOUNTER — Ambulatory Visit: Payer: BC Managed Care – PPO | Attending: Family Medicine

## 2023-09-29 ENCOUNTER — Telehealth: Payer: Self-pay | Admitting: Family Medicine

## 2023-09-29 ENCOUNTER — Ambulatory Visit (INDEPENDENT_AMBULATORY_CARE_PROVIDER_SITE_OTHER): Payer: BC Managed Care – PPO | Admitting: Family Medicine

## 2023-09-29 ENCOUNTER — Other Ambulatory Visit: Payer: Self-pay

## 2023-09-29 ENCOUNTER — Ambulatory Visit: Payer: BC Managed Care – PPO | Attending: Family Medicine | Admitting: Physical Therapy

## 2023-09-29 ENCOUNTER — Encounter: Payer: Self-pay | Admitting: Family Medicine

## 2023-09-29 VITALS — BP 91/53 | HR 69 | Temp 98.7°F | Ht 59.0 in | Wt 156.0 lb

## 2023-09-29 DIAGNOSIS — M62838 Other muscle spasm: Secondary | ICD-10-CM | POA: Insufficient documentation

## 2023-09-29 DIAGNOSIS — F411 Generalized anxiety disorder: Secondary | ICD-10-CM | POA: Diagnosis not present

## 2023-09-29 DIAGNOSIS — F322 Major depressive disorder, single episode, severe without psychotic features: Secondary | ICD-10-CM | POA: Diagnosis not present

## 2023-09-29 DIAGNOSIS — Z3009 Encounter for other general counseling and advice on contraception: Secondary | ICD-10-CM

## 2023-09-29 DIAGNOSIS — M542 Cervicalgia: Secondary | ICD-10-CM | POA: Diagnosis present

## 2023-09-29 DIAGNOSIS — G5603 Carpal tunnel syndrome, bilateral upper limbs: Secondary | ICD-10-CM

## 2023-09-29 DIAGNOSIS — R293 Abnormal posture: Secondary | ICD-10-CM | POA: Insufficient documentation

## 2023-09-29 DIAGNOSIS — K640 First degree hemorrhoids: Secondary | ICD-10-CM | POA: Diagnosis not present

## 2023-09-29 MED ORDER — ESCITALOPRAM OXALATE 20 MG PO TABS
20.0000 mg | ORAL_TABLET | Freq: Every day | ORAL | 3 refills | Status: DC
Start: 2023-09-29 — End: 2024-01-09

## 2023-09-29 MED ORDER — LO LOESTRIN FE 1 MG-10 MCG / 10 MCG PO TABS
1.0000 | ORAL_TABLET | Freq: Every day | ORAL | 4 refills | Status: DC
Start: 2023-09-29 — End: 2024-01-09

## 2023-09-29 MED ORDER — PROCTOFOAM HC 1-1 % EX FOAM
1.0000 | Freq: Two times a day (BID) | CUTANEOUS | 1 refills | Status: DC
Start: 2023-09-29 — End: 2024-01-09

## 2023-09-29 NOTE — Progress Notes (Signed)
Subjective: CC: Hemorrhoids PCP: Raliegh Ip, DO [id number] [name] is a 32 y.o. female presenting to clinic today for:  1.  Hemorrhoids Patient reports that she has been having irritation and itching of her hemorrhoids.  No bleeding currently.  Would like to have some hemorrhoid film on hand for as needed use as her symptoms have been refractory to Tucks pads and over-the-counter products.  2.  Carpal tunnel Patient will be seeing orthopedics and physical therapy for carpal tunnel.  She is been having some pain and numbness and tingling.  She even had some swelling.  The prednisone Dosepak that she was prescribed really worked well but she never did start the meloxicam.   ROS: Per HPI  Allergies  Allergen Reactions   Penicillins Rash    Has patient had a PCN reaction causing immediate rash, facial/tongue/throat swelling, SOB or lightheadedness with hypotension: Unknown Has patient had a PCN reaction causing severe rash involving mucus membranes or skin necrosis: Unknown Has patient had a PCN reaction that required hospitalization: Unknown Has patient had a PCN reaction occurring within the last 10 years: No If all of the above answers are "NO", then may proceed with Cephalosporin use. Infantile Reaction.   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   GERD (gastroesophageal reflux disease)    occas tums prm   Medical history non-contributory    Neuromuscular disorder (HCC)    carpel tunnel bilateral    Current Outpatient Medications:    escitalopram (LEXAPRO) 20 MG tablet, Take 1 tablet (20 mg total) by mouth daily., Disp: 90 tablet, Rfl: 3   hydrOXYzine (ATARAX) 50 MG tablet, Take 25-50 mg by mouth every 8 (eight) hours as needed., Disp: , Rfl:    meloxicam (MOBIC) 15 MG tablet, Take 1 tablet (15 mg total) by mouth daily., Disp: 30 tablet, Rfl: 1    Norethindrone-Ethinyl Estradiol-Fe Biphas (LO LOESTRIN FE) 1 MG-10 MCG / 10 MCG tablet, Take 1 tablet by mouth daily., Disp: 84 tablet, Rfl: 4   omeprazole (PRILOSEC) 20 MG capsule, Take 1 capsule (20 mg total) by mouth daily. AS needed, Disp: 90 capsule, Rfl: 1   predniSONE (STERAPRED UNI-PAK 21 TAB) 10 MG (21) TBPK tablet, As directed x 6 days (Patient not taking: Reported on 08/29/2023), Disp: 21 tablet, Rfl: 0 Social History   Socioeconomic History   Marital status: Legally Separated    Spouse name: Not on file   Number of children: Not on file   Years of education: Not on file   Highest education level: Not on file  Occupational History   Not on file  Tobacco Use   Smoking status: Never   Smokeless tobacco: Never  Vaping Use   Vaping status: Never Used  Substance and Sexual Activity   Alcohol use: No    Comment: occ   Drug use: No   Sexual activity: Not Currently    Birth control/protection: None  Other Topics Concern   Not on file  Social History Narrative   Not on file   Social Drivers of Health   Financial Resource Strain: Not on file  Food Insecurity: Not on file  Transportation Needs: Not on file  Physical Activity: Not on file  Stress: Not on file  Social Connections: Not on file  Intimate Partner Violence: Not on file  Family History  Problem Relation Age of Onset   Diabetes Mother    Heart disease Father    Adrenal disorder Neg Hx     Objective: Office vital signs reviewed. BP (!) 91/53   Pulse 69   Temp 98.7 F (37.1 C)   Ht 4\' 11"  (1.499 Bethany)   Wt 156 lb (70.8 kg)   SpO2 98%   BMI 31.51 kg/Bethany   Physical Examination:  General: Awake, alert, well nourished, No acute distress MSK: No gross soft tissue swelling, erythema or warmth noted of the wrists or hands.  She has congenitally short upper extremities.     08/29/2023    1:50 PM 08/15/2023   11:58 AM 05/28/2023    4:45 PM  Depression screen PHQ 2/9  Decreased Interest 2 0 1  Down,  Depressed, Hopeless 2 0 1  PHQ - 2 Score 4 0 2  Altered sleeping 2 0 1  Tired, decreased energy 2 0 1  Change in appetite 2 0 0  Feeling bad or failure about yourself  2 0 0  Trouble concentrating 2 0 1  Moving slowly or fidgety/restless 2 0 0  Suicidal thoughts 0 0 0  PHQ-9 Score 16 0 5  Difficult doing work/chores Not difficult at all Not difficult at all Somewhat difficult      08/29/2023    1:51 PM 08/15/2023   11:59 AM 05/28/2023    4:45 PM 04/08/2023    2:19 PM  GAD 7 : Generalized Anxiety Score  Nervous, Anxious, on Edge 1 0 1 2  Control/stop worrying 1 0 1 2  Worry too much - different things 1 0 1 2  Trouble relaxing 1 0 1 2  Restless 1 0 0 2  Easily annoyed or irritable 1 0 0 2  Afraid - awful might happen 0 0 0 0  Total GAD 7 Score 6 0 4 12  Anxiety Difficulty  Not difficult at all Somewhat difficult Not difficult at all    Assessment/ Plan: 32 y.o. female   Bilateral carpal tunnel syndrome  Grade I hemorrhoids - Plan: hydrocortisone-pramoxine (PROCTOFOAM HC) rectal foam  GAD (generalized anxiety disorder) - Plan: escitalopram (LEXAPRO) 20 MG tablet  Depression, major, single episode, severe (HCC) - Plan: escitalopram (LEXAPRO) 20 MG tablet  Encounter for other general counseling and advice on contraception - Plan: Norethindrone-Ethinyl Estradiol-Fe Biphas (LO LOESTRIN FE) 1 MG-10 MCG / 10 MCG tablet  Keep appointment with orthopedics and physical therapy for carpal tunnel syndrome  Proctofoam renewed for as needed use.  Use only if needed.  Did not discuss anxiety, depression, birth control but I provided refills prior to our next scheduled physical in April   Bethany Hall Bethany Bethany Heidecker, DO [facility] [phone]

## 2023-09-29 NOTE — Therapy (Signed)
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   GERD (gastroesophageal reflux disease)    occas tums prm   Medical history non-contributory    Neuromuscular disorder (HCC)    carpel tunnel bilateral   Past Surgical History:  Procedure Laterality Date   CESAREAN SECTION  2011,2014,2016,02/2017   x 4   DILATION AND EVACUATION N/A 08/07/2017   Procedure: DILATATION AND EVACUATION WITH ULTRASOUND GUIDANCE ;  Surgeon: Shea Evans, MD;  Location: [facility];  Service: Gynecology;  Laterality: N/A;   Patient Active Problem List   Diagnosis Date Noted   Carpal tunnel syndrome of left wrist 05/28/2023   GAD (generalized anxiety disorder) 04/08/2023   Mastalgia 04/08/2023   Hyperpigmentation 01/16/2021   Degenerative disc disease, cervical 10/20/2018   Chronic bilateral thoracic back pain 10/20/2018   Musculoskeletal pain of right upper extremity 10/20/2018   Chronically dry eyes, bilateral 10/20/2018   Dry mouth 10/20/2018    REFERRING PROVIDER: Delynn Flavin DO  REFERRING DIAG: Chronic neck pain.  THERAPY DIAG:  Cervicalgia  Other muscle spasm  Abnormal posture  Rationale for Evaluation and Treatment: Rehabilitation  ONSET DATE: Ongoing.  SUBJECTIVE:                                                                                                                                                                                                          SUBJECTIVE STATEMENT: The patient presents to the clinic with c/o chronic bilateral neck pain.  She states that over the last month her pain is probably the worst it has ever been.  She rates her pain at a 9-10/10. Heat and lying down help decrease her pain.  Moving and prolonged tsnading increase her pain.    PERTINENT HISTORY:  CTS and  thumb pain (see is going to have an Ortho consult at [facility]).  PAIN:  Are you having pain? Yes: NPRS scale: 9-10/10. Pain location: Neck. Pain description: Ache, throb, sharp, shooting. Aggravating factors: As above. Relieving factors: As above.    PRECAUTIONS: None  RED FLAGS: None     WEIGHT BEARING RESTRICTIONS: 29/50  FALLS:  Has patient fallen in last 6 months? No  LIVING ENVIRONMENT: Lives in: House/apartment Has following equipment at home: None  OCCUPATION: Runner, broadcasting/film/video.  PLOF: Independent  PATIENT GOALS: Decrease neck pain.  OBJECTIVE:  Note: Objective measures were completed at Evaluation unless otherwise noted.   PATIENT SURVEYS:  NDI 29/50   POSTURE: rounded shoulders and forward head  PALPATION:  C/o tenderness over patient's bilateral cervical paraspinal musculature and bilateral UT's which have a great deal of tone.  She also c/o pain diffusely over bilateral scapular musculture.     CERVICAL ROM:   Bilateral active cervical   UPPER EXTREMITY ROM:  WFL for bilateral shoulders.  UPPER EXTREMITY MMT: Bilateral shoulder strength is 4/5.   CERVICAL SPECIAL TESTS:  Absent bil Bicep reflexes, 1+/4+ brachio and tris.                                                                                                                    PATIENT EDUCATION:  Education details:  Person educated:  International aid/development worker:  Education comprehension:   HOME EXERCISE PROGRAM:   ASSESSMENT:  CLINICAL IMPRESSION: The patient presents  to [facility] with chronic neck pain that has gotten very bad over the past month.  She has diffuse palpable pain over bilateral cervical paraspinal musculature and UT's which have a great deal of tone.  Her NDI is 29/50.  Patient will benefit from skilled physical therapy intervention to address pain and deficits.  OBJECTIVE IMPAIRMENTS: decreased activity tolerance, decreased ROM, increased muscle spasms, postural dysfunction, and pain.   ACTIVITY LIMITATIONS: carrying and lifting  PARTICIPATION LIMITATIONS: meal prep, cleaning, and laundry  PERSONAL FACTORS: Time since onset of injury/illness/exacerbation are also affecting patient's functional outcome.   REHAB POTENTIAL: Good  CLINICAL DECISION MAKING: Evolving/moderate complexity  EVALUATION COMPLEXITY: Low   GOALS:  LONG TERM GOALS: Target date: 11/10/23  Ind with a HEP.  Goal status: INITIAL  2.  Perform ADL's with pain not > 3-4/10.  Goal status: INITIAL  3.  Bilateral active cervical rotation to 80-85 degrees.  Goal status: INITIAL   PLAN:  PT FREQUENCY: 2x/week  PT DURATION: 6 weeks  PLANNED INTERVENTIONS: 97110-Therapeutic exercises, 97530- Therapeutic activity, [id number]- Neuromuscular re-education, 97535- Self Care, [id number]- Manual therapy, 97014- Electrical stimulation (unattended), 97035- Ultrasound, 81191- Traction (mechanical), Patient/Family education, Dry Needling, Cryotherapy, and Moist heat  PLAN FOR NEXT SESSION: Combo e'stim/US, STW/M with ischemic release technique utilized, postural exercises.   Boots Mcglown, [place], PT 09/29/2023, 3:37 PM

## 2023-09-29 NOTE — Patient Instructions (Addendum)
See you in April for your physical. Please make sure to fast for that visit so we can do labs.

## 2023-10-07 ENCOUNTER — Ambulatory Visit: Payer: BC Managed Care – PPO | Admitting: Physical Therapy

## 2023-10-07 DIAGNOSIS — R293 Abnormal posture: Secondary | ICD-10-CM

## 2023-10-07 DIAGNOSIS — M542 Cervicalgia: Secondary | ICD-10-CM

## 2023-10-07 DIAGNOSIS — M62838 Other muscle spasm: Secondary | ICD-10-CM

## 2023-10-07 NOTE — Therapy (Signed)
 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   GERD (gastroesophageal reflux disease)    occas tums prm   Medical history non-contributory    Neuromuscular disorder (HCC)    carpel tunnel bilateral   Past Surgical History:  Procedure Laterality Date   CESAREAN SECTION  2011,2014,2016,02/2017   x 4   DILATION AND EVACUATION N/A 08/07/2017   Procedure: DILATATION AND EVACUATION WITH ULTRASOUND GUIDANCE ;  Surgeon: Barbette Knock, MD;  Location: [facility];  Service: Gynecology;  Laterality: N/A;   Patient Active Problem List   Diagnosis Date Noted   Carpal tunnel syndrome of left wrist 05/28/2023   GAD (generalized anxiety disorder) 04/08/2023   Mastalgia 04/08/2023   Hyperpigmentation 01/16/2021   Degenerative disc disease, cervical 10/20/2018   Chronic bilateral thoracic back pain 10/20/2018   Musculoskeletal pain of right upper extremity 10/20/2018   Chronically dry eyes, bilateral 10/20/2018   Dry mouth 10/20/2018    REFERRING PROVIDER: [name] DO  REFERRING DIAG: Chronic neck pain.  THERAPY DIAG:  Cervicalgia  Other muscle spasm  Abnormal posture  Rationale for Evaluation and Treatment: Rehabilitation  ONSET DATE: Ongoing.  SUBJECTIVE:                                                                                                                                                                                                          SUBJECTIVE STATEMENT: Pain at an 8.  PERTINENT HISTORY:  CTS and thumb pain (see is going to have an Ortho consult at [facility]).  PAIN:  Are you having pain? Yes: NPRS scale: 8/10. Pain location: Neck. Pain description: Ache, throb, sharp, shooting. Aggravating factors: As above. Relieving factors: As above.    PRECAUTIONS: None  RED FLAGS: None  WEIGHT BEARING RESTRICTIONS: 29/50  FALLS:  Has patient fallen in last 6 months? No  LIVING ENVIRONMENT: Lives in: House/apartment Has following equipment at home: None  OCCUPATION: Runner, Broadcasting/film/video.  PLOF: Independent  PATIENT GOALS: Decrease neck pain.  OBJECTIVE:  Note: Objective measures were completed at Evaluation unless otherwise noted.   PATIENT SURVEYS:  NDI 29/50   POSTURE: rounded shoulders and forward head  PALPATION:  C/o tenderness over patient's bilateral cervical paraspinal musculature and bilateral UT's which have a great deal of tone.  She also c/o pain diffusely over bilateral scapular musculture.     CERVICAL ROM:   Bilateral active cervical   UPPER EXTREMITY ROM:  WFL for bilateral shoulders.  UPPER EXTREMITY MMT: Bilateral shoulder strength is 4/5.   CERVICAL SPECIAL TESTS:  Absent bil Bicep reflexes, 1+/4+ brachio and tris.                                                                                                                    PATIENT EDUCATION:  Education details:  Person educated:  International aid/development worker:  Education comprehension:   HOME EXERCISE PROGRAM:  TODAY's TX:  10/07/23:  Combo e'stim/US  at 1.50 W/CM2 x 12 minutes f/b STW/M x 12 minutes f/b   HMP and IFC at 80-150 Hz on 40% scan x 20 minutes.  Normal modality response following removal of modality.    ASSESSMENT:  CLINICAL IMPRESSION: Patient with a high pain-level upon presentation to the clinic today.  Her bilateral UT's  exhibit a great deal of tone.  She did well with treatment today and feel better afterwards.  We discussed dry needling and she was provided with a consent form.  OBJECTIVE IMPAIRMENTS: decreased activity tolerance, decreased ROM, increased muscle spasms, postural dysfunction, and pain.   ACTIVITY LIMITATIONS: carrying and lifting  PARTICIPATION LIMITATIONS: meal prep, cleaning, and laundry  PERSONAL FACTORS: Time since onset of injury/illness/exacerbation are also affecting patient's functional outcome.   REHAB POTENTIAL: Good  CLINICAL DECISION MAKING: Evolving/moderate complexity  EVALUATION COMPLEXITY: Low   GOALS:  LONG TERM GOALS: Target date: 11/10/23  Ind with a HEP.  Goal status: INITIAL  2.  Perform ADL's with pain not > 3-4/10.  Goal status: INITIAL  3.  Bilateral active cervical rotation to 80-85 degrees.  Goal status: INITIAL   PLAN:  PT FREQUENCY: 2x/week  PT DURATION: 6 weeks  PLANNED INTERVENTIONS: 97110-Therapeutic exercises, 97530- Therapeutic activity, [id number]- Neuromuscular re-education, 97535- Self Care, [id number]- Manual therapy, 97014- Electrical stimulation (unattended), 97035- Ultrasound, 02987- Traction (mechanical), Patient/Family education, Dry Needling, Cryotherapy, and Moist heat  PLAN FOR NEXT SESSION: Combo e'stim/US , STW/M with ischemic release technique utilized, postural exercises.   Taijuan Serviss, PT 10/07/2023, 3:58 PM

## 2023-10-09 ENCOUNTER — Encounter: Payer: BC Managed Care – PPO | Admitting: Physical Therapy

## 2023-10-10 ENCOUNTER — Ambulatory Visit: Payer: 59 | Attending: Family Medicine

## 2023-10-10 DIAGNOSIS — R293 Abnormal posture: Secondary | ICD-10-CM | POA: Diagnosis present

## 2023-10-10 DIAGNOSIS — M62838 Other muscle spasm: Secondary | ICD-10-CM | POA: Insufficient documentation

## 2023-10-10 DIAGNOSIS — M542 Cervicalgia: Secondary | ICD-10-CM | POA: Insufficient documentation

## 2023-10-10 NOTE — Therapy (Signed)
 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   GERD (gastroesophageal reflux disease)    occas tums prm   Medical history non-contributory    Neuromuscular disorder (HCC)    carpel tunnel bilateral   Past Surgical History:  Procedure Laterality Date   CESAREAN SECTION  2011,2014,2016,02/2017   x 4   DILATION AND EVACUATION N/A 08/07/2017   Procedure: DILATATION AND EVACUATION WITH ULTRASOUND GUIDANCE ;  Surgeon: Barbette Knock, MD;  Location: [facility];  Service: Gynecology;  Laterality: N/A;   Patient Active Problem List   Diagnosis Date Noted   Carpal tunnel syndrome of left wrist 05/28/2023   GAD (generalized anxiety disorder) 04/08/2023   Mastalgia 04/08/2023   Hyperpigmentation 01/16/2021   Degenerative disc disease, cervical 10/20/2018   Chronic bilateral thoracic back pain 10/20/2018   Musculoskeletal pain of right upper extremity 10/20/2018   Chronically dry eyes, bilateral 10/20/2018   Dry mouth 10/20/2018    REFERRING PROVIDER: [name] DO  REFERRING DIAG: Chronic neck pain.  THERAPY DIAG:  Cervicalgia  Other muscle spasm  Abnormal posture  Rationale for Evaluation and Treatment: Rehabilitation  ONSET DATE: Ongoing.  SUBJECTIVE:                                                                                                                                                                                                          SUBJECTIVE STATEMENT: Pt reports 6/10 neck and bil shoulder pain.  PERTINENT HISTORY:  CTS and thumb pain (see is going to have an Ortho consult at [facility]).  PAIN:  Are you having pain? Yes: NPRS scale: 6/10. Pain location: Neck. Pain description: Ache, throb, sharp, shooting. Aggravating factors: As above. Relieving factors: As above.    PRECAUTIONS: None  RED  FLAGS: None     WEIGHT BEARING RESTRICTIONS: 29/50  FALLS:  Has patient fallen in last 6 months? No  LIVING ENVIRONMENT: Lives in: House/apartment Has following equipment at home: None  OCCUPATION: Runner, Broadcasting/film/video.  PLOF: Independent  PATIENT GOALS: Decrease neck pain.  OBJECTIVE:  Note: Objective measures were completed at Evaluation unless otherwise noted.   PATIENT SURVEYS:  NDI 29/50   POSTURE: rounded shoulders and forward head  PALPATION:  C/o tenderness over patient's bilateral cervical paraspinal musculature and bilateral UT's which have a great deal of tone.  She also c/o pain diffusely over bilateral scapular musculture.     CERVICAL ROM:   Bilateral active cervical   UPPER EXTREMITY ROM:  WFL for bilateral shoulders.  UPPER EXTREMITY MMT: Bilateral shoulder strength is 4/5.   CERVICAL SPECIAL TESTS:  Absent bil Bicep reflexes, 1+/4+ brachio and tris.                                                                                                                    PATIENT EDUCATION:  Education details:  Person educated:  International aid/development worker:  Education comprehension:   HOME EXERCISE PROGRAM:  TODAY's TX:    10/10/23:  Manual Therapy Soft Tissue Mobilization: neck and shoulders, STW/M to bil upper traps, levator scap, and cervical paraspinals to decrease pain and tone with pt in seated position    Modalities  Date:  Unattended Estim: Cervical, IFC 80-150 hz, 15 mins, Pain and Tone Combo:  Cervical, 1.5 w/cm2; 100%, 12 mins, Pain and Tone Hot Pack: Cervical, 15 mins, Pain and Tone  10/07/23:  Combo e'stim/US  at 1.50 W/CM2 x 12 minutes f/b STW/M x 12 minutes f/b   HMP and IFC at 80-150 Hz on 40% scan x 20 minutes.  Normal modality response following removal of modality.    ASSESSMENT:  CLINICAL IMPRESSION: Pt arrives for today's treatment session reporting 6/10 neck and bil shoulder pain.  Pt reported some decrease in pain after last treatment session.  Pt with normal responses to all modalities performed today.  STW/M performed to bil upper traps, levator scap, and cervical paraspinals to decrease pain and tone.  Pt reported 4/10 neck and shoulder pain at completion of today's treatment session.   OBJECTIVE IMPAIRMENTS: decreased activity tolerance, decreased ROM, increased muscle spasms, postural dysfunction, and pain.   ACTIVITY LIMITATIONS: carrying and lifting  PARTICIPATION LIMITATIONS: meal prep, cleaning, and laundry  PERSONAL FACTORS: Time since onset of injury/illness/exacerbation are also affecting patient's functional outcome.   REHAB POTENTIAL: Good  CLINICAL DECISION MAKING: Evolving/moderate complexity  EVALUATION COMPLEXITY: Low   GOALS:  LONG TERM GOALS: Target date: 11/10/23  Ind with a HEP.  Goal status: INITIAL  2.  Perform ADL's with pain not > 3-4/10.  Goal status: INITIAL  3.  Bilateral active cervical rotation to 80-85 degrees.  Goal status: INITIAL   PLAN:  PT FREQUENCY: 2x/week  PT DURATION: 6 weeks  PLANNED INTERVENTIONS: 97110-Therapeutic exercises, 97530- Therapeutic activity, [id number]- Neuromuscular re-education, 97535- Self Care, 02859-  Manual therapy, 97014- Electrical stimulation (unattended), [id number]- Ultrasound, 02987- Traction (mechanical), Patient/Family education, Dry Needling, Cryotherapy, and Moist heat  PLAN FOR NEXT SESSION: Combo e'stim/US , STW/M with ischemic release technique utilized, postural  exercises.   Delon DELENA Gosling, PTA 10/10/2023, 11:10 AM

## 2023-10-14 ENCOUNTER — Encounter: Payer: 59 | Admitting: Physical Therapy

## 2023-10-16 ENCOUNTER — Ambulatory Visit: Payer: 59

## 2023-10-16 DIAGNOSIS — M62838 Other muscle spasm: Secondary | ICD-10-CM

## 2023-10-16 DIAGNOSIS — M542 Cervicalgia: Secondary | ICD-10-CM | POA: Diagnosis not present

## 2023-10-16 DIAGNOSIS — R293 Abnormal posture: Secondary | ICD-10-CM

## 2023-10-16 NOTE — Therapy (Signed)
 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   GERD (gastroesophageal reflux disease)    occas tums prm   Medical history non-contributory    Neuromuscular disorder (HCC)    carpel tunnel bilateral   Past Surgical History:  Procedure Laterality Date   CESAREAN SECTION  2011,2014,2016,02/2017   x 4   DILATION AND EVACUATION N/A 08/07/2017   Procedure: DILATATION AND EVACUATION WITH ULTRASOUND GUIDANCE ;  Surgeon: Barbette Knock, MD;  Location: [facility];  Service: Gynecology;  Laterality: N/A;   Patient Active Problem List   Diagnosis Date Noted   Carpal tunnel syndrome of left wrist 05/28/2023   GAD (generalized anxiety disorder) 04/08/2023   Mastalgia 04/08/2023   Hyperpigmentation 01/16/2021   Degenerative disc disease, cervical 10/20/2018   Chronic bilateral thoracic back pain 10/20/2018   Musculoskeletal pain of right upper extremity 10/20/2018   Chronically dry eyes, bilateral 10/20/2018   Dry mouth 10/20/2018    REFERRING PROVIDER: [name] DO  REFERRING DIAG: Chronic neck pain.  THERAPY DIAG:  Cervicalgia  Other muscle spasm  Abnormal posture  Rationale for Evaluation and Treatment: Rehabilitation  ONSET DATE: Ongoing.  SUBJECTIVE:                                                                                                                                                                                                          SUBJECTIVE STATEMENT: Patient reports that she is hurting bad today.   PERTINENT HISTORY:  CTS and thumb pain (see is going to have an Ortho consult at [facility]).  PAIN:  Are you having pain? Yes: NPRS scale: 9/10. Pain location: Neck. Pain description: Ache, throb, sharp, shooting. Aggravating factors: As above. Relieving factors: As above.    PRECAUTIONS: None  RED FLAGS: None     WEIGHT BEARING RESTRICTIONS: 29/50  FALLS:  Has patient fallen in last 6 months? No  LIVING ENVIRONMENT: Lives in: House/apartment Has following equipment at home: None  OCCUPATION: Runner, Broadcasting/film/video.  PLOF: Independent  PATIENT GOALS: Decrease neck pain.  OBJECTIVE:  Note: Objective measures were completed at Evaluation unless otherwise noted.   PATIENT SURVEYS:  NDI 29/50   POSTURE: rounded shoulders and forward head  PALPATION:  C/o tenderness over patient's bilateral cervical paraspinal musculature and bilateral UT's which have a great deal of tone.  She also c/o pain diffusely over bilateral scapular musculture.     CERVICAL ROM:   Bilateral active cervical   UPPER EXTREMITY ROM:  WFL for bilateral shoulders.  UPPER EXTREMITY MMT: Bilateral shoulder strength is 4/5.   CERVICAL SPECIAL TESTS:  Absent bil Bicep reflexes, 1+/4+ brachio and tris.                                                                                                                  PATIENT EDUCATION:  Education details: Dry needling and anatomy Person educated: Patient Education method: Verbal Education comprehension: Patient reported understanding  HOME EXERCISE PROGRAM:  TODAY's TX:                                     10/16/23 EXERCISE LOG  Exercise Repetitions and Resistance Comments  Cervical SNAG With towel                     Blank cell = exercise not performed today  Manual Therapy Soft Tissue  Mobilization: bilateral upper trapezius and cervical paraspinals, for reduced pain and tone Joint Mobilizations: C4-6, CPA's grade I-III Manual Traction: cervical distraction, to tolerance Modalities: no redness or adverse reaction to today's modalities  Date:  Unattended Estim: Cervical, IFC @ 80-150 Hz w/ 40% scan, 15 mins, Pain and Tone Hot Pack: Cervical, 15 mins, Pain and Tone  10/10/23:  Manual Therapy Soft Tissue Mobilization: neck and shoulders, STW/M to bil upper traps, levator scap, and cervical paraspinals to decrease pain and tone with pt in seated position    Modalities  Date:  Unattended Estim: Cervical, IFC 80-150 hz, 15 mins, Pain and Tone Combo: Cervical, 1.5 w/cm2; 100%, 12 mins, Pain and Tone Hot Pack: Cervical, 15 mins, Pain and Tone  10/07/23:  Combo e'stim/US  at 1.50 W/CM2 x 12 minutes f/b STW/M x 12 minutes f/b   HMP and IFC at 80-150 Hz on 40% scan x 20 minutes.  Normal modality response following removal of modality.    ASSESSMENT:  CLINICAL IMPRESSION: Patient presented to treatment with high pain severity which limited her ability to be introduced to new interventions. Today's treatment focused on manual therapy for reduced pain and tone with good results. Cervical joint mobilizations and soft tissue mobilization to her upper trapezius bilaterally were the most effective at reducing her familiar symptoms. She reported feeling  better upon the conclusion of treatment. She continues to require skilled physical therapy to address her remaining impairments to return to her prior level of function.   OBJECTIVE IMPAIRMENTS: decreased activity tolerance, decreased ROM, increased muscle spasms, postural dysfunction, and pain.   ACTIVITY LIMITATIONS: carrying and lifting  PARTICIPATION LIMITATIONS: meal prep, cleaning, and laundry  PERSONAL FACTORS: Time since onset of injury/illness/exacerbation are also affecting patient's functional outcome.   REHAB POTENTIAL:  Good  CLINICAL DECISION MAKING: Evolving/moderate complexity  EVALUATION COMPLEXITY: Low   GOALS:  LONG TERM GOALS: Target date: 11/10/23  Ind with a HEP.  Goal status: INITIAL  2.  Perform ADL's with pain not > 3-4/10.  Goal status: INITIAL  3.  Bilateral active cervical rotation to 80-85 degrees.  Goal status: INITIAL   PLAN:  PT FREQUENCY: 2x/week  PT DURATION: 6 weeks  PLANNED INTERVENTIONS: 97110-Therapeutic exercises, 97530- Therapeutic activity, [id number]- Neuromuscular re-education, 97535- Self Care, 02859- Manual therapy, 97014- Electrical stimulation (unattended), 97035- Ultrasound, 02987- Traction (mechanical), Patient/Family education, Dry Needling, Cryotherapy, and Moist heat  PLAN FOR NEXT SESSION: Combo e'stim/US , STW/M with ischemic release technique utilized, postural exercises.   Lacinda JAYSON Fass, PT 10/16/2023, 6:05 PM

## 2023-10-21 ENCOUNTER — Ambulatory Visit: Payer: 59 | Admitting: Physical Therapy

## 2023-10-21 DIAGNOSIS — M62838 Other muscle spasm: Secondary | ICD-10-CM

## 2023-10-21 DIAGNOSIS — M542 Cervicalgia: Secondary | ICD-10-CM

## 2023-10-21 DIAGNOSIS — R293 Abnormal posture: Secondary | ICD-10-CM

## 2023-10-21 NOTE — Therapy (Signed)
 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   GERD (gastroesophageal reflux disease)    occas tums prm   Medical history non-contributory    Neuromuscular disorder (HCC)    carpel tunnel bilateral   Past Surgical History:  Procedure Laterality Date   CESAREAN SECTION  2011,2014,2016,02/2017   x 4   DILATION AND EVACUATION N/A 08/07/2017   Procedure: DILATATION AND EVACUATION WITH ULTRASOUND GUIDANCE ;  Surgeon: Barbette Knock, MD;  Location: [facility];  Service: Gynecology;  Laterality: N/A;   Patient Active Problem List   Diagnosis Date Noted   Carpal tunnel syndrome of left wrist 05/28/2023   GAD (generalized anxiety disorder) 04/08/2023   Mastalgia 04/08/2023   Hyperpigmentation 01/16/2021   Degenerative disc disease, cervical 10/20/2018   Chronic bilateral thoracic back pain 10/20/2018   Musculoskeletal pain of right upper extremity 10/20/2018   Chronically dry eyes, bilateral 10/20/2018   Dry mouth 10/20/2018    REFERRING PROVIDER: [name] DO  REFERRING DIAG: Chronic neck pain.  THERAPY DIAG:  Cervicalgia  Other muscle spasm  Abnormal posture  Rationale for Evaluation and Treatment: Rehabilitation  ONSET DATE: Ongoing.  SUBJECTIVE:                                                                                                                                                                                                          SUBJECTIVE STATEMENT: Pain at a 7 today.  PERTINENT HISTORY:  CTS and thumb pain (see is going to have an Ortho consult at [facility]).  PAIN:  Are you having pain? Yes: NPRS scale: 7/10. Pain location: Neck. Pain description: Ache, throb, sharp, shooting. Aggravating factors: As above. Relieving factors: As above.    PRECAUTIONS: None  RED FLAGS: None  WEIGHT BEARING RESTRICTIONS: 29/50  FALLS:  Has patient fallen in last 6 months? No  LIVING ENVIRONMENT: Lives in: House/apartment Has following equipment at home: None  OCCUPATION: Runner, Broadcasting/film/video.  PLOF: Independent  PATIENT GOALS: Decrease neck pain.  OBJECTIVE:  Note: Objective measures were completed at Evaluation unless otherwise noted.   PATIENT SURVEYS:  NDI 29/50   POSTURE: rounded shoulders and forward head  PALPATION:  C/o tenderness over patient's bilateral cervical paraspinal musculature and bilateral UT's which have a great deal of tone.  She also c/o pain diffusely over bilateral scapular musculture.     CERVICAL ROM:   Bilateral active cervical   UPPER EXTREMITY ROM:  WFL for bilateral shoulders.  UPPER EXTREMITY MMT: Bilateral shoulder strength is 4/5.   CERVICAL SPECIAL TESTS:  Absent bil Bicep reflexes, 1+/4+ brachio and tris.                                                                                                                  PATIENT EDUCATION:  Education details: Dry needling and anatomy Person educated: Patient Education method: Verbal Education comprehension: Patient reported understanding  HOME EXERCISE PROGRAM:  10/21/23:    TODAY's TX:  Combo e'stim/US  at 1.50 W/CM2 x 12 minutes to bilateral UT's f/b  STW/M x 12 minutes with ischemic release technique utilized f/b HMP and IFC at 80-150 Hz on 40% scan x 17 minutes.  Combo e'stim/US , STW/M with ischemic release technique  utilized                                     10/16/23 EXERCISE LOG  Exercise Repetitions and Resistance Comments  Cervical SNAG With towel                     Blank cell = exercise not performed today  Manual Therapy Soft Tissue Mobilization: bilateral upper trapezius and cervical paraspinals, for reduced pain and tone Joint Mobilizations: C4-6, CPA's grade I-III Manual Traction: cervical distraction, to tolerance Modalities: no redness or adverse reaction to today's modalities  Date:  Unattended Estim: Cervical, IFC @ 80-150 Hz w/ 40% scan, 15 mins, Pain and Tone Hot Pack: Cervical, 15 mins, Pain and Tone  10/10/23:  Manual Therapy Soft Tissue Mobilization: neck and shoulders, STW/M to bil upper traps, levator scap, and cervical paraspinals to decrease pain and tone with pt in seated position      ASSESSMENT:  CLINICAL IMPRESSION: Patient continues to exhibit a great deal of tone in bilateral UT's.  She did well with treatment today, especially soft tissue work utilizing ischemic release technique.   OBJECTIVE IMPAIRMENTS: decreased activity tolerance, decreased ROM, increased muscle spasms, postural dysfunction, and pain.   ACTIVITY LIMITATIONS: carrying and lifting  PARTICIPATION LIMITATIONS: meal prep, cleaning, and laundry  PERSONAL FACTORS: Time since onset of injury/illness/exacerbation are also affecting patient's functional outcome.   REHAB POTENTIAL: Good  CLINICAL DECISION MAKING: Evolving/moderate complexity  EVALUATION COMPLEXITY: Low  GOALS:  LONG TERM GOALS: Target date: 11/10/23  Ind with a HEP.  Goal status: INITIAL  2.  Perform ADL's with pain not > 3-4/10.  Goal status: INITIAL  3.  Bilateral active cervical rotation to 80-85 degrees.  Goal status: INITIAL   PLAN:  PT FREQUENCY: 2x/week  PT DURATION: 6 weeks  PLANNED INTERVENTIONS: 97110-Therapeutic exercises, 97530- Therapeutic activity, [id number]- Neuromuscular re-education, 97535- Self  Care, 02859- Manual therapy, 97014- Electrical stimulation (unattended), 97035- Ultrasound, 02987- Traction (mechanical), Patient/Family education, Dry Needling, Cryotherapy, and Moist heat  PLAN FOR NEXT SESSION: Combo e'stim/US , STW/M with ischemic release technique utilized, postural exercises.   Ayda Tancredi, PT 10/21/2023, 5:54 PM

## 2023-10-28 ENCOUNTER — Telehealth: Payer: Self-pay | Admitting: Family Medicine

## 2023-10-28 NOTE — Telephone Encounter (Unsigned)
Copied from CRM [id number]. Topic: Referral - Question >> Oct 28, 2023 12:27 PM Fuller Mandril wrote: Reason for CRM: Patient states that she called [facility] at number provided and was told that the referral received had the incorrect DOB and Reason for referral. The one that I show has correct information such as DOB and Diagnosis. Patient wanted to know if it could be resent or if someone could reach out to see why they have different information. Thank You

## 2023-10-29 ENCOUNTER — Encounter: Payer: Self-pay | Admitting: Family Medicine

## 2023-10-30 NOTE — Telephone Encounter (Signed)
Already addressed this issue via MyChart Message to Patient.

## 2023-12-02 ENCOUNTER — Ambulatory Visit: Payer: 59

## 2024-01-01 ENCOUNTER — Telehealth: Payer: Self-pay | Admitting: Family Medicine

## 2024-01-02 ENCOUNTER — Other Ambulatory Visit: Payer: Self-pay

## 2024-01-02 DIAGNOSIS — Z0000 Encounter for general adult medical examination without abnormal findings: Secondary | ICD-10-CM

## 2024-01-02 NOTE — Telephone Encounter (Signed)
 Pended. LS

## 2024-01-07 ENCOUNTER — Other Ambulatory Visit: Payer: Self-pay

## 2024-01-07 ENCOUNTER — Other Ambulatory Visit: Payer: BC Managed Care – PPO

## 2024-01-07 ENCOUNTER — Other Ambulatory Visit: Payer: Self-pay | Admitting: Family Medicine

## 2024-01-07 DIAGNOSIS — Z0000 Encounter for general adult medical examination without abnormal findings: Secondary | ICD-10-CM

## 2024-01-07 DIAGNOSIS — E669 Obesity, unspecified: Secondary | ICD-10-CM

## 2024-01-07 DIAGNOSIS — Z130 Encounter for screening for diseases of the blood and blood-forming organs and certain disorders involving the immune mechanism: Secondary | ICD-10-CM

## 2024-01-07 DIAGNOSIS — Z13220 Encounter for screening for lipoid disorders: Secondary | ICD-10-CM

## 2024-01-08 LAB — LIPID PANEL
Chol/HDL Ratio: 2.6 ratio (ref 0.0–4.4)
Cholesterol, Total: 179 mg/dL (ref 100–199)
HDL: 68 mg/dL (ref >39)
LDL Chol Calc (NIH): 96 mg/dL (ref 0–99)
Triglycerides: 81 mg/dL (ref 0–149)
VLDL Cholesterol Cal: 15 mg/dL (ref 5–40)

## 2024-01-08 LAB — CBC WITH DIFFERENTIAL/PLATELET
Basophils Absolute: 0 10*3/uL (ref 0.0–0.2)
Basos: 0 %
EOS (ABSOLUTE): 0.2 10*3/uL (ref 0.0–0.4)
Eos: 3 %
Hematocrit: 42 % (ref 34.0–46.6)
Hemoglobin: 13.9 g/dL (ref 11.1–15.9)
Immature Grans (Abs): 0 10*3/uL (ref 0.0–0.1)
Immature Granulocytes: 0 %
Lymphocytes Absolute: 2.6 10*3/uL (ref 0.7–3.1)
Lymphs: 27 %
MCH: 30.3 pg (ref 26.6–33.0)
MCHC: 33.1 g/dL (ref 31.5–35.7)
MCV: 92 fL (ref 79–97)
Monocytes Absolute: 0.8 10*3/uL (ref 0.1–0.9)
Monocytes: 8 %
Neutrophils Absolute: 5.8 10*3/uL (ref 1.4–7.0)
Neutrophils: 62 %
Platelets: 231 10*3/uL (ref 150–450)
RBC: 4.58 x10E6/uL (ref 3.77–5.28)
RDW: 13.7 % (ref 11.7–15.4)
WBC: 9.4 10*3/uL (ref 3.4–10.8)

## 2024-01-08 LAB — CMP14+EGFR
ALT: 13 IU/L (ref 0–32)
AST: 20 IU/L (ref 0–40)
Albumin: 4.1 g/dL (ref 3.9–4.9)
Alkaline Phosphatase: 74 IU/L (ref 44–121)
BUN/Creatinine Ratio: 15 (ref 9–23)
BUN: 10 mg/dL (ref 6–20)
Bilirubin Total: 0.2 mg/dL (ref 0.0–1.2)
CO2: 20 mmol/L (ref 20–29)
Calcium: 10 mg/dL (ref 8.7–10.2)
Chloride: 107 mmol/L — ABNORMAL HIGH (ref 96–106)
Creatinine, Ser: 0.67 mg/dL (ref 0.57–1.00)
Globulin, Total: 2.6 g/dL (ref 1.5–4.5)
Glucose: 85 mg/dL (ref 70–99)
Potassium: 4.3 mmol/L (ref 3.5–5.2)
Sodium: 139 mmol/L (ref 134–144)
Total Protein: 6.7 g/dL (ref 6.0–8.5)
eGFR: 117 mL/min/{1.73_m2} (ref >59)

## 2024-01-08 LAB — TSH+FREE T4
Free T4: 1.2 ng/dL (ref 0.82–1.77)
TSH: 1.74 u[IU]/mL (ref 0.450–4.500)

## 2024-01-09 ENCOUNTER — Other Ambulatory Visit (HOSPITAL_COMMUNITY)
Admission: RE | Admit: 2024-01-09 | Discharge: 2024-01-09 | Disposition: A | Discharge status: Home / Self Care | Source: Ambulatory Visit | Attending: Family Medicine | Admitting: Family Medicine

## 2024-01-09 ENCOUNTER — Encounter: Payer: Self-pay | Admitting: Family Medicine

## 2024-01-09 ENCOUNTER — Ambulatory Visit (INDEPENDENT_AMBULATORY_CARE_PROVIDER_SITE_OTHER): Payer: BC Managed Care – PPO | Admitting: Family Medicine

## 2024-01-09 VITALS — BP 110/65 | HR 71 | Temp 98.3°F | Ht 59.0 in | Wt 162.0 lb

## 2024-01-09 DIAGNOSIS — Z124 Encounter for screening for malignant neoplasm of cervix: Secondary | ICD-10-CM | POA: Insufficient documentation

## 2024-01-09 DIAGNOSIS — Z3009 Encounter for other general counseling and advice on contraception: Secondary | ICD-10-CM

## 2024-01-09 DIAGNOSIS — F322 Major depressive disorder, single episode, severe without psychotic features: Secondary | ICD-10-CM

## 2024-01-09 DIAGNOSIS — B353 Tinea pedis: Secondary | ICD-10-CM

## 2024-01-09 DIAGNOSIS — B3731 Acute candidiasis of vulva and vagina: Secondary | ICD-10-CM | POA: Diagnosis not present

## 2024-01-09 DIAGNOSIS — Z0001 Encounter for general adult medical examination with abnormal findings: Secondary | ICD-10-CM

## 2024-01-09 DIAGNOSIS — K640 First degree hemorrhoids: Secondary | ICD-10-CM

## 2024-01-09 DIAGNOSIS — F411 Generalized anxiety disorder: Secondary | ICD-10-CM

## 2024-01-09 DIAGNOSIS — R300 Dysuria: Secondary | ICD-10-CM | POA: Diagnosis not present

## 2024-01-09 DIAGNOSIS — Z0000 Encounter for general adult medical examination without abnormal findings: Secondary | ICD-10-CM

## 2024-01-09 LAB — URINALYSIS, ROUTINE W REFLEX MICROSCOPIC
Bilirubin, UA: NEGATIVE
Glucose, UA: NEGATIVE
Ketones, UA: NEGATIVE
Leukocytes,UA: NEGATIVE
Nitrite, UA: NEGATIVE
Protein,UA: NEGATIVE
RBC, UA: NEGATIVE
Specific Gravity, UA: 1.025 (ref 1.005–1.030)
Urobilinogen, Ur: 0.2 mg/dL (ref 0.2–1.0)
pH, UA: 7 (ref 5.0–7.5)

## 2024-01-09 LAB — WET PREP FOR TRICH, YEAST, CLUE
Clue Cell Exam: NEGATIVE
Trichomonas Exam: NEGATIVE
Yeast Exam: NEGATIVE

## 2024-01-09 MED ORDER — LO LOESTRIN FE 1 MG-10 MCG / 10 MCG PO TABS
1.0000 | ORAL_TABLET | Freq: Every day | ORAL | 4 refills | Status: DC
Start: 2024-01-09 — End: 2024-08-03

## 2024-01-09 MED ORDER — PROCTOFOAM HC 1-1 % EX FOAM
1.0000 | Freq: Two times a day (BID) | CUTANEOUS | 1 refills | Status: AC
Start: 2024-01-09 — End: ?

## 2024-01-09 MED ORDER — FLUCONAZOLE 150 MG PO TABS
150.0000 mg | ORAL_TABLET | ORAL | 0 refills | Status: AC
Start: 2024-01-09 — End: 2024-01-16

## 2024-01-09 MED ORDER — ESCITALOPRAM OXALATE 20 MG PO TABS
20.0000 mg | ORAL_TABLET | Freq: Every day | ORAL | 3 refills | Status: AC
Start: 2024-01-09 — End: ?

## 2024-01-09 MED ORDER — TERBINAFINE HCL 250 MG PO TABS: 250.0000 mg | ORAL_TABLET | Freq: Every day | ORAL | 0 refills | Status: AC

## 2024-01-09 NOTE — Progress Notes (Signed)
 Bethany Hall is a 33 y.o. female presents to office today for annual physical exam examination.    Concerns today include: 1.  Vaginitis She reports about a 3 to 4-week history of vaginitis.  She felt like this was a yeast infection that onset about 4 weeks ago and utilized Monistat.  However, it did not seem to fully go away and now she has recurrent issues with white discharge, vulvar inflammation and discomfort.  She admits she is not hydrating as well as she normally does.  No recent changes in lubrication, soaps, detergents or clothing.  Compliant with OCP  2.  Foot fungus She is tried multiple topicals with no improvement in symptomology.  She reports an itchy flaky rash between her toes.  3.  Hemorrhoid She think she has an active hemorrhoid.  Needs a refill on Proctofoam.  Reports no bleeding.  Occupation: special Scientist, research (medical), Marital status: in a relationship, Substance use: none There are no preventive care reminders to display for this patient. Refills needed today: all   There is no immunization history on file for this patient. 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   GERD (gastroesophageal reflux disease)    occas tums prm   Medical history non-contributory    Neuromuscular disorder (HCC)    carpel tunnel bilateral   Social History   Socioeconomic History   Marital status: Legally Separated    Spouse name: Not on file   Number of children: Not on file   Years of education: Not on file   Highest education level: Not on file  Occupational History   Not on file  Tobacco Use   Smoking status: Never   Smokeless tobacco: Never  Vaping Use   Vaping status: Never Used  Substance and Sexual Activity   Alcohol use: No    Comment: occ   Drug use: No   Sexual activity: Not Currently    Birth control/protection: None  Other Topics Concern    Not on file  Social History Narrative   Not on file   Social Drivers of Health   Financial Resource Strain: Not on file  Food Insecurity: Not on file  Transportation Needs: Not on file  Physical Activity: Not on file  Stress: Not on file  Social Connections: Not on file  Intimate Partner Violence: Not on file   Past Surgical History:  Procedure Laterality Date   CESAREAN SECTION  2011,2014,2016,02/2017   x 4   DILATION AND EVACUATION N/A 08/07/2017   Procedure: DILATATION AND EVACUATION WITH ULTRASOUND GUIDANCE ;  Surgeon: Shea Evans, MD;  Location: [facility];  Service: Gynecology;  Laterality: N/A;   Family History  Problem Relation Age of Onset   Diabetes Mother    Heart disease Father    Adrenal disorder Neg Hx     Current Outpatient Medications:    escitalopram (LEXAPRO) 20 MG tablet, Take 1 tablet (20 mg total) by mouth daily., Disp: 90 tablet, Rfl: 3   hydrocortisone-pramoxine (PROCTOFOAM HC) rectal foam, Place 1 applicator rectally 2 (two) times daily. X5 days max per flare, Disp: 10 g, Rfl: 1   hydrOXYzine (ATARAX) 50 MG tablet, Take 25-50 mg by mouth every 8 (eight) hours as needed., Disp: , Rfl:    Norethindrone-Ethinyl Estradiol-Fe Biphas (LO LOESTRIN FE) 1 MG-10 MCG / 10  MCG tablet, Take 1 tablet by mouth daily., Disp: 84 tablet, Rfl: 4   omeprazole (PRILOSEC) 20 MG capsule, Take 1 capsule (20 mg total) by mouth daily. AS needed, Disp: 90 capsule, Rfl: 1  Allergies  Allergen Reactions   Penicillins Rash    Has patient had a PCN reaction causing immediate rash, facial/tongue/throat swelling, SOB or lightheadedness with hypotension: Unknown Has patient had a PCN reaction causing severe rash involving mucus membranes or skin necrosis: Unknown Has patient had a PCN reaction that required hospitalization: Unknown Has patient had a PCN reaction occurring within the last 10 years: No If all of the above answers are "NO", then may proceed with Cephalosporin  use. Infantile Reaction.     ROS: Review of Systems Pertinent items noted in HPI and remainder of comprehensive ROS otherwise negative.  Left wrist pain and she is a candidate for surgery but she has not committed to it yet  Physical exam BP 110/65   Pulse 71   Temp 98.3 F (36.8 C)   Ht 4\' 11"  (1.499 m)   Wt 162 lb (73.5 kg)   SpO2 98%   BMI 32.72 kg/m  General appearance: alert, cooperative, appears stated age, no distress, and mildly obese Head: Normocephalic, without obvious abnormality, atraumatic Eyes: negative findings: lids and lashes normal, conjunctivae and sclerae normal, corneas clear, and pupils equal, round, reactive to light and accomodation Ears: normal TM's and external ear canals both ears Nose: Nares normal. Septum midline. Mucosa normal. No drainage or sinus tenderness. Throat: lips, mucosa, and tongue normal; teeth and gums normal Neck: no adenopathy, supple, symmetrical, trachea midline, and thyroid not enlarged, symmetric, no tenderness/mass/nodules Back: symmetric, no curvature. ROM normal. No CVA tenderness. Lungs: clear to auscultation bilaterally Heart: regular rate and rhythm, S1, S2 normal, no murmur, click, rub or gallop Abdomen: soft, non-tender; bowel sounds normal; no masses,  no organomegaly Pelvic: no adnexal masses or tenderness, no cervical motion tenderness, rectovaginal septum normal, uterus normal size, shape, and consistency, and external vaginal erythema and inflammation present but no ulcerations.  She has white leukorrhea appreciated.  No bleeding within the vaginal vault.  Cervix is high and I could only see the outer rim of it.  It was not entirely visualized due to discomfort with speculum exam Extremities: extremities normal, atraumatic, no cyanosis or edema Pulses: 2+ and symmetric Skin: Skin color, texture, turgor normal.  She has macerated skin between her toes of both feet Lymph nodes: Cervical, supraclavicular, and axillary nodes  normal. Neurologic: Grossly normal      09/29/2023   10:58 AM 08/29/2023    1:50 PM 08/15/2023   11:58 AM  Depression screen PHQ 2/9  Decreased Interest 1 2 0  Down, Depressed, Hopeless 1 2 0  PHQ - 2 Score 2 4 0  Altered sleeping 2 2 0  Tired, decreased energy 1 2 0  Change in appetite 1 2 0  Feeling bad or failure about yourself  1 2 0  Trouble concentrating 0 2 0  Moving slowly or fidgety/restless 0 2 0  Suicidal thoughts 0 0 0  PHQ-9 Score 7 16 0  Difficult doing work/chores Not difficult at all Not difficult at all Not difficult at all      09/29/2023   10:57 AM 08/29/2023    1:51 PM 08/15/2023   11:59 AM 05/28/2023    4:45 PM  GAD 7 : Generalized Anxiety Score  Nervous, Anxious, on Edge 1 1 0 1  Control/stop worrying 1  1 0 1  Worry too much - different things 2 1 0 1  Trouble relaxing 1 1 0 1  Restless 1 1 0 0  Easily annoyed or irritable 0 1 0 0  Afraid - awful might happen 0 0 0 0  Total GAD 7 Score 6 6 0 4  Anxiety Difficulty Not difficult at all  Not difficult at all Somewhat difficult     Assessment/ Plan: [name] here for annual physical exam.   Annual physical exam  Encounter for other general counseling and advice on contraception - Plan: Norethindrone-Ethinyl Estradiol-Fe Biphas (LO LOESTRIN FE) 1 MG-10 MCG / 10 MCG tablet  Screening for malignant neoplasm of cervix - Plan: Cytology - PAP  Yeast vaginitis - Plan: WET PREP FOR TRICH, YEAST, CLUE, fluconazole (DIFLUCAN) 150 MG tablet, CANCELED: Wet prep, genital  Tinea pedis of both feet - Plan: Hepatic function panel, terbinafine (LAMISIL) 250 MG tablet  Dysuria - Plan: Urinalysis, Routine w reflex microscopic, CANCELED: Urine Culture  Grade I hemorrhoids - Plan: hydrocortisone-pramoxine (PROCTOFOAM HC) rectal foam  GAD (generalized anxiety disorder) - Plan: escitalopram (LEXAPRO) 20 MG tablet  Depression, major, single episode, severe (HCC) - Plan: escitalopram (LEXAPRO) 20 MG  tablet  OCPs renewed.  Pap repeated.  Cotesting and GC, CT, trichomonas ordered.  Wet prep was actually unrevealing.  But I am going to go ahead and treat her with oral Diflucan regardless given ongoing vaginal irritation and physical exam which coincides with the yeast infection  With regards to her tinea I am going to place her on Lamisil.  I advised her not to start this until she has completed the Diflucan.  Hepatic function panel ordered to be checked in 6 weeks.  Urinalysis was unrevealing.  Urine culture negative  Proctofoam renewed as well as her Lexapro  Counseled on healthy lifestyle choices, including diet (rich in fruits, vegetables and lean meats and low in salt and simple carbohydrates) and exercise (at least 30 minutes of moderate physical activity daily).  Patient to follow up 1 year  Danne Scardina M. Nadine Counts, DO

## 2024-01-09 NOTE — Patient Instructions (Signed)
 Start Lamisil AFTER you complete the yeast meds   Healthy vaginal hygiene practices   -  Avoid sleeper pajamas. Nightgowns allow air to circulate.  Sleep without underpants whenever possible.  -  Wear cotton underpants during the day. Double-rinse underwear after washing to avoid residual irritants. Do not use fabric softeners for underwear and swimsuits.  - Avoid tights, leotards, leggings, "skinny" jeans, and other tight-fitting clothing. Skirts and loose-fitting pants allow air to circulate.  - Avoid pantyliners.  Instead use tampons or cotton pads.  - Daily warm bathing is helpful:     - Soak in clean water (no soap) for 10 to 15 minutes. Adding vinegar or baking soda to the water has not been specifically studied and may not be better than clean water alone.      - Use soap to wash regions other than the genital area just before getting out of the tub. Limit use of any soap on genital areas. Use fragance-free soaps.     - Rinse the genital area well and gently pat dry.  Don't rub.  Hair dryer to assist with drying can be used only if on cool setting.     - Do not use bubble baths or perfumed soaps.  - Do not use any feminine sprays, douches or powders.  These contain chemicals that will irritate the skin.  - If the genital area is tender or swollen, cool compresses may relieve the discomfort. Unscented wet wipes can be used instead of toilet paper for wiping.   - Emollients, such as Vaseline, may help protect skin and can be applied to the irritated area.  - Always remember to wipe front-to-back after bowel movements. Pat dry after urination.  - Do not sit in wet swimsuits for long periods of time after swimming

## 2024-01-13 ENCOUNTER — Encounter: Payer: Self-pay | Admitting: Family Medicine

## 2024-01-13 LAB — CYTOLOGY - PAP
Adequacy: ABSENT
Chlamydia: NEGATIVE
Comment: NEGATIVE
Comment: NEGATIVE
Comment: NORMAL
Diagnosis: NEGATIVE
Neisseria Gonorrhea: NEGATIVE
Trichomonas: NEGATIVE

## 2024-02-14 ENCOUNTER — Other Ambulatory Visit: Payer: Self-pay | Admitting: Family Medicine

## 2024-03-05 ENCOUNTER — Other Ambulatory Visit

## 2024-08-03 ENCOUNTER — Other Ambulatory Visit (HOSPITAL_COMMUNITY): Payer: Self-pay

## 2024-08-03 ENCOUNTER — Encounter: Payer: Self-pay | Admitting: Family Medicine

## 2024-08-03 DIAGNOSIS — Z3009 Encounter for other general counseling and advice on contraception: Secondary | ICD-10-CM

## 2024-08-03 MED ORDER — LO LOESTRIN FE 1 MG-10 MCG / 10 MCG PO TABS
1.0000 | ORAL_TABLET | Freq: Every day | ORAL | 4 refills | Status: DC
Start: 2024-08-03 — End: 2024-08-21

## 2024-08-03 NOTE — Telephone Encounter (Signed)
 Good afternoon [name], her plan has Lo Loestrin Fe  as product/service not covered-plan/benefit exclusion so in order to ask for a review for an exception I need medical documentation as to why Bethany Hall can't take what her plan will cover and why Lo Loestrin Fe  is medically necessary for her please.  Thanks!

## 2024-08-04 ENCOUNTER — Other Ambulatory Visit: Payer: Self-pay | Admitting: Family Medicine

## 2024-08-04 DIAGNOSIS — K219 Gastro-esophageal reflux disease without esophagitis: Secondary | ICD-10-CM

## 2024-08-19 ENCOUNTER — Telehealth: Payer: Self-pay | Admitting: Family Medicine

## 2024-08-19 NOTE — Telephone Encounter (Unsigned)
 Copied from CRM [id number]. Topic: Referral - Request for Referral >> Aug 19, 2024  3:06 PM Nathanel BROCKS wrote: Did the patient discuss referral with their provider in the last year? Yes (If No - schedule appointment) (If Yes - send message)  Appointment offered? Yes  Type of order/referral and detailed reason for visit: New referral for pt on neck, back and arms  Preference of office, provider, location: Same pt location ([facility]) also need to check on accupuncture   If referral order, have you been seen by this specialty before? Yes (If Yes, this issue or another issue? When? Where?[facility] // several months  Can we respond through MyChart? Yes

## 2024-08-21 ENCOUNTER — Other Ambulatory Visit: Payer: Self-pay | Admitting: *Deleted

## 2024-08-21 MED ORDER — BLISOVI FE 1/20 1-20 MG-MCG PO TABS: 1.0000 | ORAL_TABLET | Freq: Every day | ORAL | 11 refills | Status: AC

## 2024-08-21 NOTE — Addendum Note (Signed)
 Addended by: SEVERA ROCK HERO on: 08/21/2024 03:47 PM   Modules accepted: Orders

## 2024-08-23 NOTE — Telephone Encounter (Signed)
 She does not need a new referral. She was already seen by them earlier this year so just needs to call [facility] for follow up.

## 2024-08-23 NOTE — Telephone Encounter (Signed)
 Left message letting them know referral is not needed at this time. LS

## 2024-08-24 ENCOUNTER — Encounter: Payer: Self-pay | Admitting: Family Medicine

## 2024-08-24 DIAGNOSIS — Q688 Other specified congenital musculoskeletal deformities: Secondary | ICD-10-CM

## 2024-08-24 DIAGNOSIS — G8929 Other chronic pain: Secondary | ICD-10-CM

## 2024-08-31 ENCOUNTER — Ambulatory Visit: Attending: Family Medicine | Admitting: Physical Therapy

## 2024-08-31 ENCOUNTER — Encounter: Payer: Self-pay | Admitting: Physical Therapy

## 2024-08-31 DIAGNOSIS — G8929 Other chronic pain: Secondary | ICD-10-CM | POA: Insufficient documentation

## 2024-08-31 DIAGNOSIS — M25511 Pain in right shoulder: Secondary | ICD-10-CM | POA: Insufficient documentation

## 2024-08-31 DIAGNOSIS — R293 Abnormal posture: Secondary | ICD-10-CM | POA: Diagnosis present

## 2024-08-31 DIAGNOSIS — M542 Cervicalgia: Secondary | ICD-10-CM | POA: Insufficient documentation

## 2024-08-31 DIAGNOSIS — Q688 Other specified congenital musculoskeletal deformities: Secondary | ICD-10-CM | POA: Diagnosis not present

## 2024-08-31 DIAGNOSIS — M62838 Other muscle spasm: Secondary | ICD-10-CM | POA: Insufficient documentation

## 2024-08-31 NOTE — Therapy (Signed)
 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   GERD (gastroesophageal reflux disease)    occas tums prm   Medical history non-contributory    Neuromuscular disorder (HCC)    carpel tunnel bilateral   Past Surgical History:  Procedure Laterality Date   CESAREAN SECTION  2011,2014,2016,02/2017   x 4   DILATION AND EVACUATION N/A 08/07/2017   Procedure: DILATATION AND EVACUATION WITH ULTRASOUND GUIDANCE ;  Surgeon: Barbette Knock, MD;  Location: [facility];  Service: Gynecology;  Laterality: N/A;   Patient Active Problem List   Diagnosis Date Noted   Carpal tunnel syndrome of left wrist 05/28/2023   GAD (generalized anxiety disorder) 04/08/2023   Mastalgia 04/08/2023   Hyperpigmentation 01/16/2021   Degenerative disc disease, cervical 10/20/2018   Chronic bilateral thoracic back pain 10/20/2018   Musculoskeletal pain of right upper extremity 10/20/2018   Chronically dry eyes, bilateral 10/20/2018   Dry mouth 10/20/2018   REFERRING PROVIDER: [name] DO  REFERRING DIAG: Neck pain, arthrogryposis, right shoulder pain.    THERAPY DIAG:  Cervicalgia  Other muscle spasm  Abnormal posture  Rationale for Evaluation and Treatment: Rehabilitation  ONSET DATE: Ongoing.    SUBJECTIVE:                                                                                                                                                                                                          SUBJECTIVE STATEMENT: The patient presents to the clinic with c/o chronic neck pain.  Her pain is rated at a 7-8/10 today.  Heat helps decrease her pain.  Moving increases her pain.  She experiences occasional headaches.  PERTINENT HISTORY:  CTS.  See above.    PAIN:  Are you having pain? Yes: NPRS scale: 7-8/10. Pain location:  Bilateral neck. Pain description: Ache, throb, sharp, shooting.   Aggravating factors: As above.   Relieving factors: As above.    PRECAUTIONS: None  RED FLAGS: None     WEIGHT BEARING RESTRICTIONS: No  FALLS:  Has patient fallen in last 6 months? No  LIVING ENVIRONMENT: Lives in: House/apartment Has following equipment at home: None  OCCUPATION: Runner, Broadcasting/film/video.    PLOF: Independent  PATIENT GOALS: Decrease pain.    OBJECTIVE:   PATIENT SURVEYS:  NDI:  24/50.     POSTURE: rounded shoulders and forward head  PALPATION: C/o tenderness over bilateral UT's and cervical paraspinal musculature.     CERVICAL ROM:   Right active cervical rotation is 60 degrees and left is 55 degrees.  UPPER EXTREMITY ROM:  WFL.  UPPER EXTREMITY MMT:  Normal bilateral elbow strength.  TREATMENT DATE: 08/31/24:  HMP and IFC at 80-150 Hz on 40% scan x 20 minutes to patient's bilateral cervical musculature and UT's f/b STW/M x 8 minutes.    Normal modality resposne following removal of modality.                                                                                                                               PATIENT EDUCATION:  Education details:  Person educated:  International aid/development worker:  Education comprehension:   HOME EXERCISE PROGRAM:   ASSESSMENT:  CLINICAL IMPRESSION: The patient presents to [facility] with c/o chronic neck pain.  She is tender to palpation over bilateral UT's and cervical paraspinal musculature.  She has a loss of  bilateral active cervical rotation.  Her NDI score is 24/50.  Patient will benefit from skilled physical therapy intervention to address pain and deficits.  OBJECTIVE IMPAIRMENTS: decreased activity tolerance, decreased ROM, increased muscle spasms, postural dysfunction, and pain.   ACTIVITY LIMITATIONS: carrying and lifting  PARTICIPATION LIMITATIONS: meal prep, cleaning, and laundry  PERSONAL FACTORS: Time since onset of injury/illness/exacerbation and 1 comorbidity: neuromuscular disorder are also affecting patient's functional outcome.   REHAB POTENTIAL: Good  CLINICAL DECISION MAKING: Evolving/moderate complexity  EVALUATION COMPLEXITY: Low   GOALS: LONG TERM GOALS: Target date: 10/12/24  Ind with a HEP.  Goal status: INITIAL  2.  Improve bilateral cervical rotation to 70 degrees+ so she can turn her head more easily while driving.    Goal status: INITIAL  3.  Perform ADL's with pain not > 3/10.  Goal status: INITIAL  4.  Improve NDI score by at least 5 points.  Goal status: INITIAL     PLAN:  PT FREQUENCY: 2x/week  PT DURATION: 6 weeks  PLANNED INTERVENTIONS: 97110-Therapeutic exercises, 97530- Therapeutic activity, [id number]- Neuromuscular re-education, 97535- Self Care, 02859- Manual therapy, G0283- Electrical stimulation (unattended), 97035- Ultrasound, 79439 (1-2 muscles), 20561 (3+ muscles)- Dry Needling, Patient/Family education, Cryotherapy, and Moist heat  PLAN FOR NEXT SESSION: Postural exercises, combo e'stim/US , STW/M.   Shanice Poznanski, PT 08/31/2024, 12:47 PM

## 2024-09-09 ENCOUNTER — Ambulatory Visit: Attending: Family Medicine | Admitting: Physical Therapy

## 2024-09-09 DIAGNOSIS — M542 Cervicalgia: Secondary | ICD-10-CM | POA: Insufficient documentation

## 2024-09-09 DIAGNOSIS — M62838 Other muscle spasm: Secondary | ICD-10-CM | POA: Diagnosis present

## 2024-09-09 DIAGNOSIS — R293 Abnormal posture: Secondary | ICD-10-CM | POA: Diagnosis present

## 2024-09-09 NOTE — Therapy (Signed)
 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   GERD (gastroesophageal reflux disease)    occas tums prm   Medical history non-contributory    Neuromuscular disorder (HCC)    carpel tunnel bilateral   Past Surgical History:  Procedure Laterality Date   CESAREAN SECTION  2011,2014,2016,02/2017   x 4   DILATION AND EVACUATION N/A 08/07/2017   Procedure: DILATATION AND EVACUATION WITH ULTRASOUND GUIDANCE ;  Surgeon: Barbette Knock, MD;  Location: [facility];  Service: Gynecology;  Laterality: N/A;   Patient Active Problem List   Diagnosis Date Noted   Carpal tunnel syndrome of left wrist 05/28/2023   GAD (generalized anxiety disorder) 04/08/2023   Mastalgia 04/08/2023   Hyperpigmentation 01/16/2021   Degenerative disc disease, cervical 10/20/2018   Chronic bilateral thoracic back pain 10/20/2018   Musculoskeletal pain of right upper extremity 10/20/2018   Chronically dry eyes, bilateral 10/20/2018   Dry mouth 10/20/2018   REFERRING PROVIDER: [name] DO  REFERRING DIAG: Neck pain, arthrogryposis, right shoulder pain.    THERAPY DIAG:  Cervicalgia  Other muscle spasm  Abnormal posture  Rationale for Evaluation and Treatment: Rehabilitation  ONSET DATE: Ongoing.    SUBJECTIVE:                                                                                                                                                                                                          SUBJECTIVE STATEMENT: No new complaints.  PERTINENT HISTORY:  CTS.  See above.    PAIN:  Are you having pain? Yes: NPRS scale: 7-8/10. Pain location: Bilateral neck. Pain description: Ache, throb, sharp, shooting.   Aggravating factors: As above.   Relieving factors: As above.    PRECAUTIONS: None  RED FLAGS: None  WEIGHT BEARING RESTRICTIONS: No  FALLS:  Has patient fallen in last 6 months? No  LIVING ENVIRONMENT: Lives in: House/apartment Has following equipment at home: None  OCCUPATION: Runner, Broadcasting/film/video.    PLOF: Independent  PATIENT GOALS: Decrease pain.    OBJECTIVE:   PATIENT SURVEYS:  NDI:  24/50.     POSTURE: rounded shoulders and forward head  PALPATION: C/o tenderness over bilateral UT's and cervical paraspinal musculature.     CERVICAL ROM:   Right active cervical rotation is 60 degrees and left is 55 degrees.  UPPER EXTREMITY ROM:  WFL.  UPPER EXTREMITY MMT:  Normal bilateral elbow strength.  TREATMENT DATE:   09/09/24:  Combo e'stim/US  at 1.50 W/CM2 x 12 minutes to bilateral UT's f/b STW/M x 12 minutes to bilateral cervical paraspinal musculature and UT's f/b HMP and IFC at 80-150 Hz on 40% scan x 20 minutes to patient's bilateral cervical musculature and UT's.  Normal modality resposne following removal of modality.                                                                  08/31/24:  HMP and IFC at 80-150 Hz on 40% scan x 20 minutes to patient's bilateral cervical musculature and UT's f/b STW/M x 8 minutes.    Normal modality resposne following removal of modality.                                                                                                                               PATIENT EDUCATION:  Education details:  Person educated:  International aid/development worker:  Education comprehension:   HOME EXERCISE  PROGRAM:   ASSESSMENT:  CLINICAL IMPRESSION: The patient presents to [facility] with c/o chronic neck pain.  She is tender to palpation over bilateral UT's and cervical paraspinal musculature.  She has a loss of bilateral active cervical rotation.  Her NDI score is 24/50.  Patient will benefit from skilled physical therapy intervention to address pain and deficits.  OBJECTIVE IMPAIRMENTS: decreased activity tolerance, decreased ROM, increased muscle spasms, postural dysfunction, and pain.   ACTIVITY LIMITATIONS: carrying and lifting  PARTICIPATION LIMITATIONS: meal prep, cleaning, and laundry  PERSONAL FACTORS: Time since onset of injury/illness/exacerbation and 1 comorbidity: neuromuscular disorder are also affecting patient's functional outcome.   REHAB POTENTIAL: Good  CLINICAL DECISION MAKING: Evolving/moderate complexity  EVALUATION COMPLEXITY: Low   GOALS: LONG TERM GOALS: Target date: 10/12/24  Ind with a HEP.  Goal status: INITIAL  2.  Improve bilateral cervical rotation to 70 degrees+ so she can turn her head more easily while driving.    Goal status: INITIAL  3.  Perform ADL's with pain not > 3/10.  Goal status: INITIAL  4.  Improve NDI score  by at least 5 points.  Goal status: INITIAL     PLAN:  PT FREQUENCY: 2x/week  PT DURATION: 6 weeks  PLANNED INTERVENTIONS: 97110-Therapeutic exercises, 97530- Therapeutic activity, [id number]- Neuromuscular re-education, 97535- Self Care, 02859- Manual therapy, G0283- Electrical stimulation (unattended), 97035- Ultrasound, 79439 (1-2 muscles), 20561 (3+ muscles)- Dry Needling, Patient/Family education, Cryotherapy, and Moist heat  PLAN FOR NEXT SESSION: Postural exercises, combo e'stim/US , STW/M.   Lash Matulich, PT 09/09/2024, 6:20 PM

## 2024-09-16 ENCOUNTER — Ambulatory Visit: Admitting: *Deleted

## 2024-09-21 ENCOUNTER — Ambulatory Visit: Admitting: *Deleted

## 2024-09-28 ENCOUNTER — Ambulatory Visit

## 2024-09-28 DIAGNOSIS — M542 Cervicalgia: Secondary | ICD-10-CM

## 2024-09-28 DIAGNOSIS — M62838 Other muscle spasm: Secondary | ICD-10-CM

## 2024-09-28 DIAGNOSIS — R293 Abnormal posture: Secondary | ICD-10-CM

## 2024-09-28 NOTE — Therapy (Signed)
 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   GERD (gastroesophageal reflux disease)    occas tums prm   Medical history non-contributory    Neuromuscular disorder (HCC)    carpel tunnel bilateral   Past Surgical History:  Procedure Laterality Date   CESAREAN SECTION  2011,2014,2016,02/2017   x 4   DILATION AND EVACUATION N/A 08/07/2017   Procedure: DILATATION AND EVACUATION WITH ULTRASOUND GUIDANCE ;  Surgeon: Barbette Knock, MD;  Location: [facility];  Service: Gynecology;  Laterality: N/A;   Patient Active Problem List   Diagnosis Date Noted   Carpal tunnel syndrome of left wrist 05/28/2023   GAD (generalized anxiety disorder) 04/08/2023   Mastalgia 04/08/2023   Hyperpigmentation 01/16/2021   Degenerative disc disease, cervical 10/20/2018   Chronic bilateral thoracic back pain 10/20/2018   Musculoskeletal pain of right upper extremity 10/20/2018   Chronically dry eyes, bilateral 10/20/2018   Dry mouth 10/20/2018   REFERRING PROVIDER: [name] DO  REFERRING DIAG: Neck pain, arthrogryposis, right shoulder pain.    THERAPY DIAG:  Cervicalgia  Other muscle spasm  Abnormal posture  Rationale for Evaluation and Treatment: Rehabilitation  ONSET DATE: Ongoing.    SUBJECTIVE:                                                                                                                                                                                                          SUBJECTIVE STATEMENT: Pt reports 6-7/10 neck pain.   PERTINENT HISTORY:  CTS.  See above.    PAIN:  Are you having pain? Yes: NPRS scale: 6-7/10. Pain location: Bilateral neck. Pain description: Ache, throb, sharp, shooting.   Aggravating factors: As above.   Relieving factors: As above.    PRECAUTIONS: None  RED FLAGS: None  WEIGHT BEARING RESTRICTIONS: No  FALLS:  Has patient fallen in last 6 months? No  LIVING ENVIRONMENT: Lives in: House/apartment Has following equipment at home: None  OCCUPATION: Runner, Broadcasting/film/video.    PLOF: Independent  PATIENT GOALS: Decrease pain.    OBJECTIVE:   PATIENT SURVEYS:  NDI:  24/50.     POSTURE: rounded shoulders and forward head  PALPATION: C/o tenderness over bilateral UT's and cervical paraspinal musculature.     CERVICAL ROM:   Right active cervical rotation is 60 degrees and left is 55 degrees.  UPPER EXTREMITY ROM:  WFL.  UPPER EXTREMITY MMT:  Normal bilateral elbow strength.  TREATMENT DATE:   09/28/24:  Combo e'stim/US  at 1.50 W/CM2 x 12 minutes to bilateral UT's f/b STW/M to bilateral cervical paraspinal musculature and UT's f/b HMP and IFC at 80-150 Hz on 40% scan x 20 minutes to patient's bilateral cervical musculature and UT's.  Normal modality resposne following removal of modality.                                                                  08/31/24:  HMP and IFC at 80-150 Hz on 40% scan x 20 minutes to patient's bilateral cervical musculature and UT's f/b STW/M x 8 minutes.    Normal modality resposne following removal of modality.                                                                                                                               PATIENT EDUCATION:  Education details:  Person educated:  International aid/development worker:  Education comprehension:   HOME EXERCISE  PROGRAM:   ASSESSMENT:  CLINICAL IMPRESSION: Pt arrives for today's treatment session reporting 6-7/10 neck pain.  Normal responses to all modalities performed today.  STW/M performed to bil upper traps to decrease pain and tone.  Pt reported 4-5/10 neck pain at completion of today's treatment session.  OBJECTIVE IMPAIRMENTS: decreased activity tolerance, decreased ROM, increased muscle spasms, postural dysfunction, and pain.   ACTIVITY LIMITATIONS: carrying and lifting  PARTICIPATION LIMITATIONS: meal prep, cleaning, and laundry  PERSONAL FACTORS: Time since onset of injury/illness/exacerbation and 1 comorbidity: neuromuscular disorder are also affecting patient's functional outcome.   REHAB POTENTIAL: Good  CLINICAL DECISION MAKING: Evolving/moderate complexity  EVALUATION COMPLEXITY: Low   GOALS: LONG TERM GOALS: Target date: 10/12/24  Ind with a HEP.  Goal status: INITIAL  2.  Improve bilateral cervical rotation to 70 degrees+ so she can turn her head more easily while driving.    Goal status: INITIAL  3.  Perform ADL's with pain not > 3/10.  Goal status: INITIAL  4.  Improve NDI score by at least 5 points.  Goal status: INITIAL     PLAN:  PT FREQUENCY: 2x/week  PT DURATION: 6 weeks  PLANNED INTERVENTIONS: 97110-Therapeutic exercises, 97530- Therapeutic activity, [id number]- Neuromuscular re-education, 97535- Self Care, [id number]- Manual therapy, [id number]- Electrical stimulation (unattended), 97035- Ultrasound, 79439 (1-2 muscles), 20561 (3+ muscles)- Dry Needling, Patient/Family education, Cryotherapy, and Moist heat  PLAN FOR NEXT SESSION: Postural exercises, combo e'stim/US , STW/M.   Delon DELENA Gosling, PTA 09/28/2024, 3:20 PM  "

## 2024-10-05 ENCOUNTER — Ambulatory Visit: Admitting: *Deleted

## 2024-10-14 ENCOUNTER — Ambulatory Visit

## 2024-10-19 ENCOUNTER — Ambulatory Visit: Admitting: *Deleted

## 2024-10-21 ENCOUNTER — Ambulatory Visit: Attending: Family Medicine | Admitting: *Deleted

## 2024-10-21 ENCOUNTER — Encounter: Payer: Self-pay | Admitting: *Deleted

## 2024-10-21 DIAGNOSIS — M62838 Other muscle spasm: Secondary | ICD-10-CM | POA: Insufficient documentation

## 2024-10-21 DIAGNOSIS — M542 Cervicalgia: Secondary | ICD-10-CM | POA: Insufficient documentation

## 2024-10-21 DIAGNOSIS — R293 Abnormal posture: Secondary | ICD-10-CM | POA: Insufficient documentation

## 2024-10-21 NOTE — Therapy (Signed)
 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   GERD (gastroesophageal reflux disease)    occas tums prm   Medical history non-contributory    Neuromuscular disorder (HCC)    carpel tunnel bilateral   Past Surgical History:  Procedure Laterality Date   CESAREAN SECTION  2011,2014,2016,02/2017   x 4   DILATION AND EVACUATION N/A 08/07/2017   Procedure: DILATATION AND EVACUATION WITH ULTRASOUND GUIDANCE ;  Surgeon: Barbette Knock, MD;  Location: [facility];  Service: Gynecology;  Laterality: N/A;   Patient Active Problem List   Diagnosis Date Noted   Carpal tunnel syndrome of left wrist 05/28/2023   GAD (generalized anxiety disorder) 04/08/2023   Mastalgia 04/08/2023   Hyperpigmentation 01/16/2021   Degenerative disc disease, cervical 10/20/2018   Chronic bilateral thoracic back pain 10/20/2018   Musculoskeletal pain of right upper extremity 10/20/2018   Chronically dry eyes, bilateral 10/20/2018   Dry mouth 10/20/2018   REFERRING PROVIDER: [name] DO  REFERRING DIAG: Neck pain, arthrogryposis, right shoulder pain.    THERAPY DIAG:  Cervicalgia  Other muscle spasm  Abnormal posture  Rationale for Evaluation and Treatment: Rehabilitation  ONSET DATE: Ongoing.    SUBJECTIVE:                                                                                                                                                                                                          SUBJECTIVE STATEMENT: Pt reports 6-7/10 neck pain today. Do better after Rxs   PERTINENT HISTORY:  CTS.  See above.    PAIN:  Are you having pain? Yes: NPRS scale: 6-7/10. Pain location: Bilateral neck. Pain description: Ache, throb, sharp, shooting.   Aggravating factors: As above.   Relieving factors: As above.    PRECAUTIONS: None  RED FLAGS: None     WEIGHT BEARING RESTRICTIONS: No  FALLS:  Has patient fallen  in last 6 months? No  LIVING ENVIRONMENT: Lives in: House/apartment Has following equipment at home: None  OCCUPATION: Runner, Broadcasting/film/video.    PLOF: Independent  PATIENT GOALS: Decrease pain.    OBJECTIVE:   PATIENT SURVEYS:  NDI:  24/50.     POSTURE: rounded shoulders and forward head  PALPATION: C/o tenderness over bilateral UT's and cervical paraspinal musculature.     CERVICAL ROM:   Right active cervical rotation is 60 degrees and left is 55 degrees.  UPPER EXTREMITY ROM:  WFL.  UPPER EXTREMITY MMT:  Normal bilateral elbow strength.  TREATMENT DATE:   10/22/23:  US  Combo e'stim at 1.50 W/CM2 x 12 minutes to bilateral UT's   STW/M to bilateral cervical paraspinal musculature and UT's x 15 mins HMP and IFC at 80-150 Hz on 40% scan x 18 minutes to patient's bilateral cervical musculature and UT's.  Normal modality resposne following removal of modality.                                                                  08/31/24:  HMP and IFC at 80-150 Hz on 40% scan x 20 minutes to patient's bilateral cervical musculature and UT's f/b STW/M x 8 minutes.    Normal modality resposne following removal of modality.                                                                                                                               PATIENT EDUCATION:  Education details:  Person educated:  International aid/development worker:  Education comprehension:   HOME EXERCISE PROGRAM:   ASSESSMENT:  CLINICAL IMPRESSION: Pt arrives for today's treatment session  reporting 6-7/10 neck pain.  Rx focused on manual therapy, US  combo, as well as IFC and did well. Normal responses to all modalities performed today. Notable Tp's bil. Cerv. Paras. Pt reported 4-5/10 neck pain at completion of today's treatment session. Cerv.  Rotation  LT / RT  60 degrees  OBJECTIVE IMPAIRMENTS: decreased activity tolerance, decreased ROM, increased muscle spasms, postural dysfunction, and pain.   ACTIVITY LIMITATIONS: carrying and lifting  PARTICIPATION LIMITATIONS: meal prep, cleaning, and laundry  PERSONAL FACTORS: Time since onset of injury/illness/exacerbation and 1 comorbidity: neuromuscular disorder are also affecting patient's functional outcome.   REHAB POTENTIAL: Good  CLINICAL DECISION MAKING: Evolving/moderate complexity  EVALUATION COMPLEXITY: Low   GOALS: LONG TERM GOALS: Target date: 10/12/24  Ind with a HEP.  Goal status: On going  2.  Improve bilateral cervical rotation to 70 degrees+ so she can turn her head more easily while driving.    Goal status: On going  3.  Perform ADL's with pain not > 3/10.  Goal status: On going  4.  Improve NDI score by  at least 5 points.  Goal status: on going     PLAN:  PT FREQUENCY: 2x/week  PT DURATION: 6 weeks  PLANNED INTERVENTIONS: 97110-Therapeutic exercises, 97530- Therapeutic activity, [id number]- Neuromuscular re-education, 97535- Self Care, 02859- Manual therapy, G0283- Electrical stimulation (unattended), 97035- Ultrasound, 79439 (1-2 muscles), 20561 (3+ muscles)- Dry Needling, Patient/Family education, Cryotherapy, and Moist heat  PLAN FOR NEXT SESSION: Postural exercises, combo e'stim/US , STW/M.   Jhovany Weidinger,CHRIS, PTA 10/21/2024, 6:00 PM  "

## 2024-10-25 NOTE — Addendum Note (Signed)
 Addended by: Rakiya Krawczyk W on: 10/25/2024 02:25 PM   Modules accepted: Orders

## 2025-01-10 ENCOUNTER — Encounter: Payer: Self-pay | Admitting: Family Medicine
